# Patient Record
Sex: Female | Born: 1945 | Race: White | Hispanic: No | Marital: Married | Smoking: Never smoker
Health system: Southern US, Community
[De-identification: ages and names within clinical notes are randomized; demographics above are authoritative.]

## PROBLEM LIST (undated history)

## (undated) DIAGNOSIS — S82209A Unspecified fracture of shaft of unspecified tibia, initial encounter for closed fracture: Secondary | ICD-10-CM

## (undated) DIAGNOSIS — R079 Chest pain, unspecified: Secondary | ICD-10-CM

## (undated) DIAGNOSIS — T4145XA Adverse effect of unspecified anesthetic, initial encounter: Secondary | ICD-10-CM

## (undated) DIAGNOSIS — E559 Vitamin D deficiency, unspecified: Secondary | ICD-10-CM

## (undated) DIAGNOSIS — E7800 Pure hypercholesterolemia, unspecified: Secondary | ICD-10-CM

## (undated) DIAGNOSIS — T8859XA Other complications of anesthesia, initial encounter: Secondary | ICD-10-CM

## (undated) DIAGNOSIS — M1990 Unspecified osteoarthritis, unspecified site: Secondary | ICD-10-CM

## (undated) DIAGNOSIS — J302 Other seasonal allergic rhinitis: Secondary | ICD-10-CM

## (undated) DIAGNOSIS — E039 Hypothyroidism, unspecified: Secondary | ICD-10-CM

## (undated) DIAGNOSIS — S93431A Sprain of tibiofibular ligament of right ankle, initial encounter: Secondary | ICD-10-CM

## (undated) DIAGNOSIS — K219 Gastro-esophageal reflux disease without esophagitis: Secondary | ICD-10-CM

## (undated) DIAGNOSIS — Z9889 Other specified postprocedural states: Secondary | ICD-10-CM

## (undated) DIAGNOSIS — I10 Essential (primary) hypertension: Secondary | ICD-10-CM

## (undated) DIAGNOSIS — R112 Nausea with vomiting, unspecified: Secondary | ICD-10-CM

## (undated) DIAGNOSIS — S82409A Unspecified fracture of shaft of unspecified fibula, initial encounter for closed fracture: Secondary | ICD-10-CM

## (undated) DIAGNOSIS — E063 Autoimmune thyroiditis: Secondary | ICD-10-CM

## (undated) HISTORY — DX: Sprain of tibiofibular ligament of right ankle, initial encounter: S93.431A

## (undated) HISTORY — PX: MYOMECTOMY: SHX85

## (undated) HISTORY — DX: Chest pain, unspecified: R07.9

## (undated) HISTORY — DX: Pure hypercholesterolemia, unspecified: E78.00

## (undated) HISTORY — PX: BUNIONECTOMY: SHX129

## (undated) HISTORY — PX: COLONOSCOPY: SHX174

## (undated) HISTORY — DX: Unspecified fracture of shaft of unspecified tibia, initial encounter for closed fracture: S82.209A

## (undated) HISTORY — DX: Essential (primary) hypertension: I10

## (undated) HISTORY — DX: Unspecified fracture of shaft of unspecified fibula, initial encounter for closed fracture: S82.409A

## (undated) HISTORY — DX: Autoimmune thyroiditis: E06.3

## (undated) HISTORY — DX: Other seasonal allergic rhinitis: J30.2

## (undated) HISTORY — DX: Hypothyroidism, unspecified: E03.9

---

## 1997-12-09 ENCOUNTER — Other Ambulatory Visit: Admission: RE | Admit: 1997-12-09 | Discharge: 1997-12-09 | Payer: Self-pay | Admitting: Obstetrics and Gynecology

## 1998-12-11 ENCOUNTER — Other Ambulatory Visit: Admission: RE | Admit: 1998-12-11 | Discharge: 1998-12-11 | Payer: Self-pay | Admitting: Obstetrics and Gynecology

## 1998-12-25 ENCOUNTER — Encounter (INDEPENDENT_AMBULATORY_CARE_PROVIDER_SITE_OTHER): Payer: Self-pay | Admitting: Specialist

## 1998-12-25 ENCOUNTER — Other Ambulatory Visit: Admission: RE | Admit: 1998-12-25 | Discharge: 1998-12-25 | Payer: Self-pay | Admitting: Obstetrics and Gynecology

## 2001-01-01 ENCOUNTER — Other Ambulatory Visit: Admission: RE | Admit: 2001-01-01 | Discharge: 2001-01-01 | Payer: Self-pay | Admitting: Obstetrics and Gynecology

## 2001-02-22 ENCOUNTER — Ambulatory Visit (HOSPITAL_COMMUNITY): Admission: RE | Admit: 2001-02-22 | Discharge: 2001-02-22 | Payer: Self-pay | Admitting: Obstetrics and Gynecology

## 2002-02-04 ENCOUNTER — Other Ambulatory Visit: Admission: RE | Admit: 2002-02-04 | Discharge: 2002-02-04 | Payer: Self-pay | Admitting: Obstetrics and Gynecology

## 2003-02-11 ENCOUNTER — Other Ambulatory Visit: Admission: RE | Admit: 2003-02-11 | Discharge: 2003-02-11 | Payer: Self-pay | Admitting: Obstetrics and Gynecology

## 2003-05-14 ENCOUNTER — Ambulatory Visit (HOSPITAL_COMMUNITY): Admission: RE | Admit: 2003-05-14 | Discharge: 2003-05-14 | Payer: Self-pay | Admitting: Gastroenterology

## 2004-03-01 ENCOUNTER — Other Ambulatory Visit: Admission: RE | Admit: 2004-03-01 | Discharge: 2004-03-01 | Payer: Self-pay | Admitting: Obstetrics and Gynecology

## 2005-03-07 ENCOUNTER — Other Ambulatory Visit: Admission: RE | Admit: 2005-03-07 | Discharge: 2005-03-07 | Payer: Self-pay | Admitting: Obstetrics and Gynecology

## 2006-05-22 ENCOUNTER — Encounter: Admission: RE | Admit: 2006-05-22 | Discharge: 2006-05-22 | Payer: Self-pay | Admitting: Family Medicine

## 2007-03-05 ENCOUNTER — Encounter: Admission: RE | Admit: 2007-03-05 | Discharge: 2007-03-05 | Payer: Self-pay | Admitting: Family Medicine

## 2010-07-02 NOTE — H&P (Signed)
[facility]  PatientMARGIE, [name] Visit Number: [id number] MRN: [id number]          Service Type: Attending:  Juluis Mire, M.D. Dictated by:   Juluis Mire, M.D. Adm. Date:  02/22/01                           History and Physical  CHIEF COMPLAINT:              The patient is a 65 year old nulligravida married white female, who presents for diagnostic laparoscopy with laser standby for evaluation of pelvic mass.  HISTORY OF PRESENT ILLNESS:   In relation to the present admission, the patient was seen on January 01, 2001 for routine annual examination.  It was felt she had an adnexal fullness on the right side.  We felt that it could be a pedunculated fibroid as she had a history of that.  Subsequently she had an ultrasound performed in the office which revealed multiple fibroids.  There was a 5 cm right adnexal mass that could not be differentiated from the ovary from a fibroid.  Therefore, she presents for laparoscopic evaluation to determine.  ALLERGIES:                    No known drug allergies.  MEDICATIONS:                  She has been on hormone replacement therapy, which she has subsequently weaned herself off of.  Denies any abnormal bleeding.  PAST MEDICAL HISTORY:         Usual childhood diseases.  No significant sequelae.  PAST SURGICAL HISTORY:        1. Bunionectomy.                               2. Laser ablation of condyloma.                               3. Dermabrasion of the face.  FAMILY HISTORY:               Strong family history of myocardial infarction and atherosclerotic cardiovascular disease.  Also a history of type 2 diabetes.  SOCIAL HISTORY:               No tobacco or alcohol use.  REVIEW OF SYSTEMS:            Noncontributory.  PHYSICAL EXAMINATION:  VITAL SIGNS:                  The patient is afebrile, with stable vital signs.  HEENT:                        Normocephalic.  PERRLA.  EOMI.   Sclerae and conjunctivae clear.  Oropharynx clear.  NECK:                         Without thyromegaly.  BREAST:                       No discrete masses.  LUNGS:                        Clear.  CARDIOVASCULAR:               Regular rhythm and rate without murmurs or gallops.  ABDOMEN:                      Benign.  No masses, organomegaly, or tenderness.  PELVIC:                       Normal external genitalia.  Vaginal mucosa clear.  Cervix unremarkable.  Uterus is enlarged, consistent with known uterine fibroids.  There is a continued right-sided fullness.  Left adnexa unremarkable.  EXTREMITIES:                  Trace edema.  NEUROLOGIC:                   Examination grossly within normal limits.  IMPRESSION:                   Uterine fibroids, possibly a pedunculated fibroid on the right side; rule out ovarian source.  PLAN:                         The patient is to undergo laparoscopic evaluation for management.  The risks of surgery have been discussed including the risks of anesthesia, the risk of infection, the risk of vascular injury that could lead to hemorrhage requiring transfusion with risk for AIDS or hepatitis, the risk of injury to adjacent organs including bladder or bowel that could require further exploratory surgery, risk of deep vein thrombosis and pulmonary embolus.  The patient expressed understanding of the indications and risks. Dictated by:   Juluis Mire, M.D. Attending:  Juluis Mire, M.D. DD:  02/22/01 TD:  02/22/01 Job: [id number]

## 2010-07-02 NOTE — Op Note (Signed)
NAME:  [name], [name]                            ACCOUNT NO.:  [id number]   MEDICAL RECORD NO.:  [id number]                   PATIENT TYPE:  AMB   LOCATION:  ENDO                                 FACILITY:  [facility]   PHYSICIAN:  John C. Madilyn Fireman, M.D.                 DATE OF BIRTH:  05-30-1945   DATE OF PROCEDURE:  05/14/2003  DATE OF DISCHARGE:                                 OPERATIVE REPORT   PROCEDURE:  Colonoscopy.   INDICATION FOR PROCEDURE:  Colon cancer screening in [name] 65 year old patient  with no recent screening.   DESCRIPTION OF PROCEDURE:  The patient was placed in the left lateral  decubitus position and placed on the pulse monitor with continuous low-flow  oxygen delivered by nasal cannula.  She was sedated with 82.5 mcg IV  fentanyl and 9 mg IV Versed.  The Olympus video colonoscope was inserted  into the rectum and advanced to the cecum, confirmed by transillumination at  McBurney's point and visualization of the ileocecal valve and appendiceal  orifice.  The prep was excellent.  The cecum, ascending, transverse,  descending, and sigmoid colon all appeared normal with no masses, polyps,  diverticula, or other mucosal abnormalities.  The rectum likewise appeared  normal, and retroflexed view of the anus revealed no obvious internal  hemorrhoids.  The scope was then withdrawn and the patient returned to the  recovery room in stable condition.  She tolerated the procedure well, and  there were no immediate complications.   IMPRESSION:  Normal colonoscopy.   PLAN:  Next screening by sigmoidoscopy in 5 years.                                               John C. Madilyn Fireman, M.D.    JCH/MEDQ  D:  05/14/2003  T:  05/14/2003  Job:  [id number]   cc:   Juluis Mire, M.D.  [address] [place]  [place] [address]  Fax: [phone]

## 2010-07-02 NOTE — Op Note (Signed)
[facility]  PatientAYLEEN, [name] Visit Number: [id number] MRN: [id number]          Service Type: DSU Location: [facility] Attending Physician:  Frederich Balding Dictated by:   Juluis Mire, M.D. Proc. Date: 02/22/01 Admit Date:  02/22/2001                             Operative Report  PREOPERATIVE DIAGNOSIS:       Uterine fibroid.  POSTOPERATIVE DIAGNOSIS:      Uterine fibroid.  OPERATION:                    Laparoscopy.  SURGEON:                      Juluis Mire, M.D.  ANESTHESIA:                   General endotracheal.  ESTIMATED BLOOD LOSS:         Minimal.  PACKS AND DRAINS:             None.  INTRAOPERATIVE BLOOD REPLACEMENT:                  None.  COMPLICATIONS:                None.  INDICATIONS:                  Are dictated in the History and Physical.  DESCRIPTION OF PROCEDURE:     The patient was taken to the OR and placed in the supine position.  After a satisfactory level of general endotracheal anesthesia was obtained, the patient was placed in the dorsolithotomy position using Allen stirrups.  The abdomen, perineum, and vagina were prepped out with Betadine.  A Hulka tenaculum was put in place.  The bladder was emptied by in and out catheterization.  The patient was draped out for laparoscopy.  A subumbilical incision was made with the knife.  The Veress needle was introduced in the abdominal cavity.  The abdomen was inflated with approximately 2 L of carbon dioxide.  The operating laparoscope was introduced.  Visualization revealed no evidence of injury to adjacent organs. A 5 mm trocar was put in place in the suprapubic area.  The appendix was seen and noted to be normal.  The upper abdomen including the liver and tip of the gallbladder and both lateral gutters were clear.  The uterus had a fibroid on the fundal area, measuring approximately 2 cm, and there was a pedunculated fibroid coming off the right side,  and measured approximately 6 cm.  Both ovaries were small and atrophic.  There was no other active process.  It was felt that the fibroid was too large to remove laparoscopically, and required minilaparotomy, and therefore was not _________ at this point in time. Everything looked benign.  At this point in time, the abdomen was deflated with carbon dioxide.  All trocars were removed.  The subumbilical incision was closed with interrupted subcuticular 4-0 Vicryl.  The suprapubic incision was closed with Steri-Strips.  The Hulka tenaculum was removed.  The patient was taken out of the dorsolithotomy position once alert and extubated, and transferred to the recovery room in good condition.  The sponge, instrument, and needle count was reported as correct by the circulating nurse. Dictated by:   Juluis Mire, M.D.  Attending Physician:  Frederich Balding DD:  02/22/01 TD:  02/22/01 Job: [id number]

## 2011-08-16 ENCOUNTER — Other Ambulatory Visit: Payer: Self-pay | Admitting: Family Medicine

## 2011-08-16 DIAGNOSIS — Z1231 Encounter for screening mammogram for malignant neoplasm of breast: Secondary | ICD-10-CM

## 2011-08-17 ENCOUNTER — Other Ambulatory Visit: Payer: Self-pay | Admitting: Family Medicine

## 2011-08-17 DIAGNOSIS — R1013 Epigastric pain: Secondary | ICD-10-CM

## 2011-08-24 ENCOUNTER — Ambulatory Visit
Admission: RE | Admit: 2011-08-24 | Discharge: 2011-08-24 | Disposition: A | Discharge status: Home / Self Care | Payer: BC Managed Care – PPO | Source: Ambulatory Visit | Attending: Family Medicine | Admitting: Family Medicine

## 2011-08-24 DIAGNOSIS — R1013 Epigastric pain: Secondary | ICD-10-CM

## 2011-09-02 ENCOUNTER — Ambulatory Visit
Admission: RE | Admit: 2011-09-02 | Discharge: 2011-09-02 | Disposition: A | Discharge status: Home / Self Care | Payer: BC Managed Care – PPO | Source: Ambulatory Visit | Attending: Family Medicine | Admitting: Family Medicine

## 2011-09-02 DIAGNOSIS — Z1231 Encounter for screening mammogram for malignant neoplasm of breast: Secondary | ICD-10-CM

## 2012-08-29 ENCOUNTER — Other Ambulatory Visit: Payer: Self-pay

## 2012-08-29 DIAGNOSIS — Z1231 Encounter for screening mammogram for malignant neoplasm of breast: Secondary | ICD-10-CM

## 2012-09-20 ENCOUNTER — Ambulatory Visit
Admission: RE | Admit: 2012-09-20 | Discharge: 2012-09-20 | Disposition: A | Discharge status: Home / Self Care | Payer: BC Managed Care – PPO | Source: Ambulatory Visit

## 2012-09-20 DIAGNOSIS — Z1231 Encounter for screening mammogram for malignant neoplasm of breast: Secondary | ICD-10-CM

## 2013-01-13 ENCOUNTER — Encounter: Payer: Self-pay | Admitting: Cardiology

## 2013-01-14 ENCOUNTER — Encounter: Payer: Self-pay | Admitting: Interventional Cardiology

## 2013-01-14 ENCOUNTER — Encounter (INDEPENDENT_AMBULATORY_CARE_PROVIDER_SITE_OTHER): Payer: Self-pay

## 2013-01-14 ENCOUNTER — Ambulatory Visit (INDEPENDENT_AMBULATORY_CARE_PROVIDER_SITE_OTHER): Payer: Medicare Other | Admitting: Interventional Cardiology

## 2013-01-14 VITALS — BP 140/82 | HR 54 | Ht 62.0 in | Wt 145.0 lb

## 2013-01-14 DIAGNOSIS — I10 Essential (primary) hypertension: Secondary | ICD-10-CM

## 2013-01-14 DIAGNOSIS — R079 Chest pain, unspecified: Secondary | ICD-10-CM

## 2013-01-14 DIAGNOSIS — E785 Hyperlipidemia, unspecified: Secondary | ICD-10-CM | POA: Insufficient documentation

## 2013-01-14 HISTORY — DX: Chest pain, unspecified: R07.9

## 2013-01-14 NOTE — Progress Notes (Signed)
Patient ID: [name], female   DOB: Aug 18, 1945, 67 y.o.   MRN: [id number]   Date: 01/14/2013 ID: [name], DOB Mar 07, 1945, MRN [id number] PCP: Shirlean Mylar, D, MD  Reason: Chest pain  ASSESSMENT;  1. Chest pain 2. Hyperlipidemia 3. Hypertension  PLAN:  1. ETT 2. Low salt, active lifestyle, low-fat   SUBJECTIVE: [name] is a 67 y.o. female who is known as a burning/cold sensation when she would breathe deeply after working in her yard in the late summer. This sensation lasted nearly 6 weeks. She mentioned it to her primary physician and was sent for cardiac evaluation. She is active and walks 40 minutes on a near daily basis. During the time frame that the chest sensation was present, there was no limitation of physical activity or worsening in the discomfort. She denied dyspnea. The sensation is completely resolved. She started Prilosec and perhaps this brought about some improvement. She denies orthopnea, PND, edema, palpitations, exertional chest pain, and transient neurological symptoms.   No Known Allergies  Current Outpatient Prescriptions on File Prior to Visit  Medication Sig Dispense Refill  . aspirin 81 MG tablet Take 81 mg by mouth daily.      . diphenhydrAMINE (BENADRYL) 25 MG tablet Take 25 mg by mouth as needed.      . fexofenadine (ALLEGRA) 180 MG tablet Take 180 mg by mouth daily as needed for allergies or rhinitis.      . fluticasone (FLONASE) 50 MCG/ACT nasal spray Place 2 sprays into both nostrils as needed for allergies or rhinitis.      . hydrochlorothiazide (HYDRODIURIL) 25 MG tablet Take 25 mg by mouth daily.      . Multiple Vitamin (MULTIVITAMIN) tablet Take 1 tablet by mouth daily.      . simvastatin (ZOCOR) 40 MG tablet Take 40 mg by mouth daily.       No current facility-administered medications on file prior to visit.    Past Medical History  Diagnosis Date  . Hypertension   . High cholesterol     Past Surgical History  Procedure  Laterality Date  . Laparascopy      , for Fibroids    History   Social History  . Marital Status: Married    Spouse Name: N/A    Number of Children: N/A  . Years of Education: N/A   Occupational History  . Not on file.   Social History Main Topics  . Smoking status: Never Smoker   . Smokeless tobacco: Never Used  . Alcohol Use: 0.0 oz/week    3-4 Glasses of wine per week  . Drug Use: No  . Sexual Activity: Not on file   Other Topics Concern  . Not on file   Social History Narrative  . No narrative on file    No family history on file.  ROS: No history of stroke, claudication, racing heart, irregular heartbeat, edema, weight gain or weight loss, change in appetite, or other complaints.. Other systems negative for complaints.  OBJECTIVE: BP 140/82  Pulse 54  Ht 5\' 2"  (1.575 m)  Wt 145 lb (65.772 kg)  BMI 26.51 kg/m2,  General: No acute distress, younger than stated age HEENT: normal   Neck: JVD flat. Carotids absent  Chest: Clear Cardiac: Murmur: 1 of 6 systolic murmur right upper sternal border. Gallop: Absent. Rhythm: Normal. Other: Normal Abdomen: Bruit: Absent. Pulsation: Absent Extremities: Edema: Absent. Pulses: 1-2+ Neuro: Normal Psych: Normal  ECG: normal

## 2013-01-14 NOTE — Patient Instructions (Addendum)
Your physician recommends that you continue on your current medications as directed. Please refer to the Current Medication list given to you today.  Your physician has requested that you have an exercise tolerance test. For further information please visit [email or web address] Please also follow instruction sheet, as given.  Follow up pending exercise tolerance test

## 2013-02-26 ENCOUNTER — Encounter: Payer: Medicare Other | Admitting: Physician Assistant

## 2013-02-28 ENCOUNTER — Ambulatory Visit (INDEPENDENT_AMBULATORY_CARE_PROVIDER_SITE_OTHER): Payer: Medicare Other | Admitting: Physician Assistant

## 2013-02-28 DIAGNOSIS — R079 Chest pain, unspecified: Secondary | ICD-10-CM

## 2013-02-28 NOTE — Progress Notes (Signed)
Stress test demonstrates no problem with blocked arteries. No further testing needed.

## 2013-02-28 NOTE — Progress Notes (Signed)
Exercise Treadmill Test  Pre-Exercise Testing Evaluation Rhythm: normal sinus  Rate: 65 bpm     Test  Exercise Tolerance Test Ordering MD: Verdis PrimeHenry Smith, MD  Interpreting MD: Tereso Newcomerscott Emily Massar, PA-C  Unique Test No: 1  Treadmill:  1  Indication for ETT: chest pain - rule out ischemia  Contraindication to ETT: No   Stress Modality: exercise - treadmill  Cardiac Imaging Performed: non   Protocol: standard Bruce - maximal  Max BP:  204/102  Max MPHR (bpm):  155 85% MPR (bpm):  132  MPHR obtained (bpm):  153 % MPHR obtained:  98  Reached 85% MPHR (min:sec):  4:35 Total Exercise Time (min-sec):  9:00  Workload in METS:  10.1 Borg Scale: 16  Reason ETT Terminated:  desired heart rate attained    ST Segment Analysis At Rest: normal ST segments - no evidence of significant ST depression With Exercise: no evidence of significant ST depression  Other Information Arrhythmia:  No Angina during ETT:  absent (0) Quality of ETT:  diagnostic  ETT Interpretation:  normal - no evidence of ischemia by ST analysis  Comments: Good exercise capacity. No chest pain. Normal BP response to exercise. No ST changes to suggest ischemia.   Recommendations: F/u with Dr. Verdis PrimeHenry [name] as directed. Signed,  Tereso NewcomerScott Christeena Krogh, PA-C   02/28/2013 4:24 PM

## 2013-03-05 NOTE — Progress Notes (Signed)
Stress test is normal. No further testing

## 2013-09-17 ENCOUNTER — Other Ambulatory Visit: Payer: Self-pay

## 2013-09-17 DIAGNOSIS — Z1231 Encounter for screening mammogram for malignant neoplasm of breast: Secondary | ICD-10-CM

## 2013-09-23 ENCOUNTER — Ambulatory Visit
Admission: RE | Admit: 2013-09-23 | Discharge: 2013-09-23 | Disposition: A | Discharge status: Home / Self Care | Payer: Medicare Other | Source: Ambulatory Visit

## 2013-09-23 DIAGNOSIS — Z1231 Encounter for screening mammogram for malignant neoplasm of breast: Secondary | ICD-10-CM

## 2014-08-21 ENCOUNTER — Other Ambulatory Visit: Payer: Self-pay

## 2014-08-21 DIAGNOSIS — Z1231 Encounter for screening mammogram for malignant neoplasm of breast: Secondary | ICD-10-CM

## 2014-08-26 ENCOUNTER — Other Ambulatory Visit: Payer: Self-pay | Admitting: Family Medicine

## 2014-08-26 ENCOUNTER — Other Ambulatory Visit: Payer: Self-pay

## 2014-08-26 ENCOUNTER — Ambulatory Visit
Admission: RE | Admit: 2014-08-26 | Discharge: 2014-08-26 | Disposition: A | Discharge status: Home / Self Care | Payer: Medicare Other | Source: Ambulatory Visit | Attending: Family Medicine | Admitting: Family Medicine

## 2014-08-26 DIAGNOSIS — M79604 Pain in right leg: Secondary | ICD-10-CM

## 2014-08-28 ENCOUNTER — Other Ambulatory Visit: Payer: Self-pay

## 2014-09-26 ENCOUNTER — Ambulatory Visit: Payer: Self-pay

## 2014-10-10 ENCOUNTER — Other Ambulatory Visit: Payer: Self-pay | Admitting: Family Medicine

## 2014-10-10 DIAGNOSIS — R102 Pelvic and perineal pain: Secondary | ICD-10-CM

## 2014-10-16 ENCOUNTER — Ambulatory Visit
Admission: RE | Admit: 2014-10-16 | Discharge: 2014-10-16 | Disposition: A | Discharge status: Home / Self Care | Payer: Medicare Other | Source: Ambulatory Visit

## 2014-10-16 DIAGNOSIS — Z1231 Encounter for screening mammogram for malignant neoplasm of breast: Secondary | ICD-10-CM

## 2015-01-27 ENCOUNTER — Emergency Department (HOSPITAL_COMMUNITY): Payer: Medicare Other

## 2015-01-27 ENCOUNTER — Encounter (HOSPITAL_COMMUNITY): Payer: Self-pay | Admitting: *Deleted

## 2015-01-27 ENCOUNTER — Inpatient Hospital Stay (HOSPITAL_COMMUNITY): Payer: Medicare Other

## 2015-01-27 ENCOUNTER — Inpatient Hospital Stay (HOSPITAL_COMMUNITY)
Admission: EM | Admit: 2015-01-27 | Discharge: 2015-01-30 | DRG: 494 | Disposition: A | Discharge status: Home Health | Payer: Medicare Other | Attending: Orthopedic Surgery | Admitting: Orthopedic Surgery

## 2015-01-27 DIAGNOSIS — S82409A Unspecified fracture of shaft of unspecified fibula, initial encounter for closed fracture: Secondary | ICD-10-CM | POA: Diagnosis present

## 2015-01-27 DIAGNOSIS — Z79899 Other long term (current) drug therapy: Secondary | ICD-10-CM

## 2015-01-27 DIAGNOSIS — M79644 Pain in right finger(s): Secondary | ICD-10-CM

## 2015-01-27 DIAGNOSIS — W1830XA Fall on same level, unspecified, initial encounter: Secondary | ICD-10-CM | POA: Diagnosis present

## 2015-01-27 DIAGNOSIS — E785 Hyperlipidemia, unspecified: Secondary | ICD-10-CM | POA: Diagnosis present

## 2015-01-27 DIAGNOSIS — Z7982 Long term (current) use of aspirin: Secondary | ICD-10-CM

## 2015-01-27 DIAGNOSIS — R52 Pain, unspecified: Secondary | ICD-10-CM

## 2015-01-27 DIAGNOSIS — S93431A Sprain of tibiofibular ligament of right ankle, initial encounter: Secondary | ICD-10-CM | POA: Diagnosis present

## 2015-01-27 DIAGNOSIS — E559 Vitamin D deficiency, unspecified: Secondary | ICD-10-CM | POA: Diagnosis present

## 2015-01-27 DIAGNOSIS — S82401A Unspecified fracture of shaft of right fibula, initial encounter for closed fracture: Secondary | ICD-10-CM | POA: Diagnosis present

## 2015-01-27 DIAGNOSIS — S82201A Unspecified fracture of shaft of right tibia, initial encounter for closed fracture: Secondary | ICD-10-CM

## 2015-01-27 DIAGNOSIS — E7800 Pure hypercholesterolemia, unspecified: Secondary | ICD-10-CM | POA: Diagnosis present

## 2015-01-27 DIAGNOSIS — S82871A Displaced pilon fracture of right tibia, initial encounter for closed fracture: Secondary | ICD-10-CM | POA: Diagnosis present

## 2015-01-27 DIAGNOSIS — E782 Mixed hyperlipidemia: Secondary | ICD-10-CM | POA: Diagnosis present

## 2015-01-27 DIAGNOSIS — S82209A Unspecified fracture of shaft of unspecified tibia, initial encounter for closed fracture: Secondary | ICD-10-CM

## 2015-01-27 DIAGNOSIS — I10 Essential (primary) hypertension: Secondary | ICD-10-CM | POA: Diagnosis present

## 2015-01-27 DIAGNOSIS — Z419 Encounter for procedure for purposes other than remedying health state, unspecified: Secondary | ICD-10-CM

## 2015-01-27 DIAGNOSIS — S93401A Sprain of unspecified ligament of right ankle, initial encounter: Secondary | ICD-10-CM | POA: Diagnosis present

## 2015-01-27 DIAGNOSIS — S82301A Unspecified fracture of lower end of right tibia, initial encounter for closed fracture: Secondary | ICD-10-CM

## 2015-01-27 DIAGNOSIS — M24049 Loose body in unspecified finger joint(s): Secondary | ICD-10-CM

## 2015-01-27 HISTORY — DX: Unspecified fracture of shaft of unspecified fibula, initial encounter for closed fracture: S82.409A

## 2015-01-27 HISTORY — DX: Unspecified fracture of shaft of unspecified fibula, initial encounter for closed fracture: S82.209A

## 2015-01-27 HISTORY — DX: Vitamin D deficiency, unspecified: E55.9

## 2015-01-27 LAB — CBC WITH DIFFERENTIAL/PLATELET
BASOS ABS: 0 10*3/uL (ref 0.0–0.1)
BASOS PCT: 0 %
Eosinophils Absolute: 0 10*3/uL (ref 0.0–0.7)
Eosinophils Relative: 0 %
HEMATOCRIT: 47.9 % — AB (ref 36.0–46.0)
Hemoglobin: 17.1 g/dL — ABNORMAL HIGH (ref 12.0–15.0)
LYMPHS PCT: 8 %
Lymphs Abs: 1.1 10*3/uL (ref 0.7–4.0)
MCH: 32.7 pg (ref 26.0–34.0)
MCHC: 35.7 g/dL (ref 30.0–36.0)
MCV: 91.6 fL (ref 78.0–100.0)
MONO ABS: 0.8 10*3/uL (ref 0.1–1.0)
Monocytes Relative: 5 %
NEUTROS ABS: 12.1 10*3/uL — AB (ref 1.7–7.7)
NEUTROS PCT: 87 %
Platelets: 222 10*3/uL (ref 150–400)
RBC: 5.23 MIL/uL — AB (ref 3.87–5.11)
RDW: 12.4 % (ref 11.5–15.5)
WBC: 14 10*3/uL — AB (ref 4.0–10.5)

## 2015-01-27 LAB — BASIC METABOLIC PANEL
ANION GAP: 14 (ref 5–15)
BUN: 13 mg/dL (ref 6–20)
CALCIUM: 10 mg/dL (ref 8.9–10.3)
CO2: 24 mmol/L (ref 22–32)
Chloride: 101 mmol/L (ref 101–111)
Creatinine, Ser: 0.86 mg/dL (ref 0.44–1.00)
Glucose, Bld: 135 mg/dL — ABNORMAL HIGH (ref 65–99)
POTASSIUM: 3.9 mmol/L (ref 3.5–5.1)
Sodium: 139 mmol/L (ref 135–145)

## 2015-01-27 MED ORDER — ONDANSETRON HCL 4 MG PO TABS: 4.0000 mg | ORAL_TABLET | Freq: Four times a day (QID) | ORAL | Status: DC | PRN

## 2015-01-27 MED ORDER — ONDANSETRON HCL 4 MG/2ML IJ SOLN
4.0000 mg | Freq: Four times a day (QID) | INTRAMUSCULAR | Status: DC | PRN
  Administered 2015-01-29: 4 mg via INTRAVENOUS

## 2015-01-27 MED ORDER — ZOLPIDEM TARTRATE 5 MG PO TABS: 5.0000 mg | ORAL_TABLET | Freq: Every evening | ORAL | Status: DC | PRN

## 2015-01-27 MED ORDER — KCL IN DEXTROSE-NACL 20-5-0.45 MEQ/L-%-% IV SOLN
INTRAVENOUS | Status: DC
  Administered 2015-01-27: 10 mL/h via INTRAVENOUS
  Filled 2015-01-27: qty 1000

## 2015-01-27 MED ORDER — OXYCODONE HCL 5 MG PO TABS
5.0000 mg | ORAL_TABLET | ORAL | Status: DC | PRN
  Administered 2015-01-28: 5 mg via ORAL
  Filled 2015-01-27: qty 1

## 2015-01-27 MED ORDER — SIMVASTATIN 40 MG PO TABS
40.0000 mg | ORAL_TABLET | Freq: Every day | ORAL | Status: DC
  Administered 2015-01-27 – 2015-01-28 (×2): 40 mg via ORAL
  Filled 2015-01-27 (×3): qty 1

## 2015-01-27 MED ORDER — FLEET ENEMA 7-19 GM/118ML RE ENEM: 1.0000 | ENEMA | Freq: Once | RECTAL | Status: DC | PRN

## 2015-01-27 MED ORDER — POLYETHYLENE GLYCOL 3350 17 G PO PACK: 17.0000 g | PACK | Freq: Every day | ORAL | Status: DC | PRN

## 2015-01-27 MED ORDER — HYDROCHLOROTHIAZIDE 25 MG PO TABS
25.0000 mg | ORAL_TABLET | Freq: Every day | ORAL | Status: DC
  Administered 2015-01-28: 25 mg via ORAL
  Filled 2015-01-27: qty 1

## 2015-01-27 MED ORDER — FLUTICASONE PROPIONATE 50 MCG/ACT NA SUSP: 2.0000 | NASAL | Status: DC | PRN

## 2015-01-27 MED ORDER — BISACODYL 10 MG RE SUPP: 10.0000 mg | Freq: Every day | RECTAL | Status: DC | PRN

## 2015-01-27 MED ORDER — HYDROMORPHONE HCL 1 MG/ML IJ SOLN
1.0000 mg | Freq: Once | INTRAMUSCULAR | Status: AC
  Administered 2015-01-27: 1 mg via INTRAVENOUS
  Filled 2015-01-27: qty 1

## 2015-01-27 MED ORDER — LORATADINE 10 MG PO TABS
10.0000 mg | ORAL_TABLET | Freq: Every day | ORAL | Status: DC
  Filled 2015-01-27: qty 1

## 2015-01-27 MED ORDER — DOCUSATE SODIUM 100 MG PO CAPS
100.0000 mg | ORAL_CAPSULE | Freq: Two times a day (BID) | ORAL | Status: DC
Start: 2015-01-27 — End: 2015-01-30
  Administered 2015-01-27 – 2015-01-30 (×6): 100 mg via ORAL
  Filled 2015-01-27 (×5): qty 1

## 2015-01-27 MED ORDER — MORPHINE SULFATE (PF) 2 MG/ML IV SOLN
1.0000 mg | INTRAVENOUS | Status: DC | PRN
  Administered 2015-01-27 – 2015-01-29 (×4): 2 mg via INTRAVENOUS
  Filled 2015-01-27 (×5): qty 1

## 2015-01-27 MED ORDER — DIPHENHYDRAMINE HCL 25 MG PO TABS: 25.0000 mg | ORAL_TABLET | ORAL | Status: DC | PRN

## 2015-01-27 NOTE — ED Notes (Signed)
Attempted to call report.  Informed are in report, try back in about 5 minutes.  Lordis will be the nurse.

## 2015-01-27 NOTE — ED Provider Notes (Signed)
7:45 PM patient is resting comfortably. [facility]  Doug SouSam Vlad Mayberry, MD 01/27/15 [phone]

## 2015-01-27 NOTE — Progress Notes (Signed)
CSW met with patient at bedside. There was no family present. Patient confirms that she presents to [facility] due to falling. Patient stated " I had on a backpack blower. I was blowing leaves and slipped." According to patient, she does not fall often and has been able to complete her ADL's independently prior to this incident.  Patient informed CSW that she lives at home in [place] with her husband. Patient states that she has a great support system that consist of her husband, sister, and children.  Patient states that if rehab is needed she would prefer home health. Patient became slightly tearful at bedside and appeared to be overwhelmed. CSW offered encouragement.  Patient states that she does not have any questions for CSW.  Husband/ Rob [phone] Sister/ Elle [phone]  Willette Brace [phone] ED CSW 01/27/2015 1:10 PM

## 2015-01-27 NOTE — ED Notes (Signed)
Patient transported to CT 

## 2015-01-27 NOTE — ED Notes (Signed)
Report given to Tiffany, RN for completion of transfer.

## 2015-01-27 NOTE — ED Notes (Signed)
Ortho MD @BS

## 2015-01-27 NOTE — ED Provider Notes (Signed)
CSN: [id number]     Arrival date & time 01/27/15  1240 History   First MD Initiated Contact with Patient 01/27/15 1249     Chief Complaint  Patient presents with  . Ankle Injury     (Consider location/radiation/quality/duration/timing/severity/associated sxs/prior Treatment) Patient is a 69 y.o. female presenting with ankle pain. The history is provided by the patient.  Ankle Pain Location:  Leg Time since incident:  1 hour Injury: yes   Mechanism of injury: fall   Fall:    Fall occurred:  Standing   Impact surface:  Dirt   Entrapped after fall: no   Leg location:  R lower leg Pain details:    Quality:  Aching   Radiates to:  Does not radiate   Severity:  Moderate   Onset quality:  Sudden   Duration:  1 hour   Timing:  Constant   Progression:  Unchanged Chronicity:  New Relieved by:  Nothing Worsened by:  Bearing weight Ineffective treatments:  None tried Associated symptoms: no fever    69 yo F with a chief complaint of a fall.  Patient was blowing leaves, and slipped on an incline.  Inverted her right ankle.  Pain and deformity afterwards.  Denies prior injury.  Picked up by EMS.  Given of fentanyl enroute.   Past Medical History  Diagnosis Date  . Hypertension   . High cholesterol    Past Surgical History  Procedure Laterality Date  . Laparascopy      , for Fibroids   Family History  Problem Relation Age of Onset  . CVA Mother   . Heart disease Father   . Heart attack Sister    Social History  Substance Use Topics  . Smoking status: Never Smoker   . Smokeless tobacco: Never Used  . Alcohol Use: 0.0 oz/week    3-4 Glasses of wine per week   OB History    No data available     Review of Systems  Constitutional: Negative for fever and chills.  HENT: Negative for congestion and rhinorrhea.   Eyes: Negative for redness and visual disturbance.  Respiratory: Negative for shortness of breath and wheezing.   Cardiovascular: Negative for chest  pain and palpitations.  Gastrointestinal: Negative for nausea and vomiting.  Genitourinary: Negative for dysuria and urgency.  Musculoskeletal: Positive for myalgias and arthralgias.  Skin: Negative for pallor and wound.  Neurological: Negative for dizziness and headaches.      Allergies  Review of patient's allergies indicates no known allergies.  Home Medications   Prior to Admission medications   Medication Sig Start Date End Date Taking? Authorizing Provider  aspirin 81 MG tablet Take 81 mg by mouth daily.   Yes Historical Provider, MD  diphenhydrAMINE (BENADRYL) 25 MG tablet Take 25 mg by mouth as needed.   Yes Historical Provider, MD  fexofenadine (ALLEGRA) 180 MG tablet Take 180 mg by mouth daily as needed for allergies or rhinitis.   Yes Historical Provider, MD  fluticasone (FLONASE) 50 MCG/ACT nasal spray Place 2 sprays into both nostrils as needed for allergies or rhinitis.   Yes Historical Provider, MD  hydrochlorothiazide (HYDRODIURIL) 25 MG tablet Take 25 mg by mouth daily.   Yes Historical Provider, MD  ibuprofen (ADVIL,MOTRIN) 200 MG tablet Take 200 mg by mouth every 6 (six) hours as needed for moderate pain.   Yes Historical Provider, MD  simvastatin (ZOCOR) 40 MG tablet Take 40 mg by mouth daily.   Yes Historical Provider,  MD   BP 146/78 mmHg  Pulse 77  Temp(Src) 98.5 F (36.9 C) (Oral)  Resp 16  Ht 5\' 2"  (1.575 m)  Wt 144 lb (65.318 kg)  BMI 26.33 kg/m2  SpO2 97% Physical Exam  Constitutional: She is oriented to person, place, and time. She appears well-developed and well-nourished. No distress.  HENT:  Head: Normocephalic and atraumatic.  Eyes: EOM are normal. Pupils are equal, round, and reactive to light.  Neck: Normal range of motion. Neck supple.  Cardiovascular: Normal rate and regular rhythm.  Exam reveals no gallop and no friction rub.   No murmur heard. Pulmonary/Chest: Effort normal. She has no wheezes. She has no rales.  Abdominal: Soft. She  exhibits no distension. There is no tenderness. There is no rebound and no guarding.  Musculoskeletal: She exhibits edema and tenderness.  Deformity noted to the right lower extremity.  PMS intact distally.    Neurological: She is alert and oriented to person, place, and time.  Skin: Skin is warm and dry. She is not diaphoretic.  Psychiatric: She has a normal mood and affect. Her behavior is normal.  Nursing note and vitals reviewed.   ED Course  Procedures (including critical care time) Labs Review Labs Reviewed  CBC WITH DIFFERENTIAL/PLATELET - Abnormal; Notable for the following:    WBC 14.0 (*)    RBC 5.23 (*)    Hemoglobin 17.1 (*)    HCT 47.9 (*)    Neutro Abs 12.1 (*)    All other components within normal limits  BASIC METABOLIC PANEL - Abnormal; Notable for the following:    Glucose, Bld 135 (*)    All other components within normal limits    Imaging Review Dg Chest 2 View  01/27/2015  CLINICAL DATA:  Pain following fall.  Hypertension EXAM: CHEST  2 VIEW COMPARISON:  March 05, 2007 FINDINGS: There is no edema or consolidation. The heart size and pulmonary vascularity are normal. No adenopathy. No pneumothorax. There is mild degenerative change in the thoracic spine. IMPRESSION: No edema or consolidation.  No demonstrable pneumothorax. Electronically Signed   By: Bretta BangWilliam  Woodruff III M.D.   On: 01/27/2015 13:58   Dg Tibia/fibula Right  01/27/2015  CLINICAL DATA:  Pain following fall EXAM: RIGHT TIBIA AND FIBULA - 2 VIEW COMPARISON:  None. FINDINGS: Frontal and lateral views were obtained. There is a comminuted fracture of the mid to distal tibia with fracture fragments extending to the level of the tibial plafond. There is mild separation of fracture fragments at the level of the plafond. There is mild posterior displacement of the distal major fracture fragment compared to the major proximal fragment of the tibia. There is also a comminuted fracture at the junction of the  mid and distal thirds of the fibula with several displaced fracture fragments. There is posterior angulation and displacement of the distal major fragment with respect to the major proximal fragment. There are no more proximal fractures. No gross dislocation is seen. No appreciable arthropathy. IMPRESSION: Comminuted, displaced fractures of the distal tibia and distal aspect of the mid third of the fibula extending to the junction of mid and distal thirds. Note that there is a fracture fragment extending to the level of the medial tibial plafond with mild separation of the plafond and distally. No frank dislocations. Electronically Signed   By: Bretta BangWilliam  Woodruff III M.D.   On: 01/27/2015 13:57   Dg Ankle Complete Right  01/27/2015  CLINICAL DATA:  Fall in back ER EXAM:  RIGHT ANKLE - COMPLETE 3+ VIEW COMPARISON:  None. FINDINGS: Three views of the right ankle submitted. There is comminuted displaced fracture distal shaft of right fibula. Comminuted displaced fracture distal tibia. Please note the fracture line is involving the distal articular tibial surface. IMPRESSION: Comminuted fractures of distal tibia and fibula. Intra-articular extension of tibial fracture. Electronically Signed   By: Natasha Mead M.D.   On: 01/27/2015 13:58   Dg Knee Complete 4 Views Right  01/27/2015  CLINICAL DATA:  Right lower leg pain following slip and fall in yard, initial encounter EXAM: RIGHT KNEE - COMPLETE 4+ VIEW COMPARISON:  None. FINDINGS: There is no evidence of fracture, dislocation, or joint effusion. There is no evidence of arthropathy or other focal bone abnormality. Soft tissues are unremarkable. IMPRESSION: No acute abnormality noted. Electronically Signed   By: Alcide Clever M.D.   On: 01/27/2015 13:57   I have personally reviewed and evaluated these images and lab results as part of my medical decision-making.   EKG Interpretation None      MDM   Final diagnoses:  Tibial fracture, right, closed, initial  encounter  Fibula fracture, right, closed, initial encounter    69 yo F with a chief complaint of RLL pain.  Patient with obvious deformity, not open.  PMS intact distally.  Xray with comminuted fx of distal tibia to the joint.  Discussed with Dr. Ave Filter on ortho, will admit, transfer to [facility].   The patients results and plan were reviewed and discussed.   Any x-rays performed were independently reviewed by myself.   Differential diagnosis were considered with the presenting HPI.  Medications  HYDROmorphone (DILAUDID) injection 1 mg (1 mg Intravenous Given 01/27/15 1442)    Filed Vitals:   01/27/15 1252 01/27/15 1430 01/27/15 1441 01/27/15 1445  BP: 149/84 139/88  146/78  Pulse: 65 70  77  Temp: 98.5 F (36.9 C)     TempSrc: Oral     Resp: 18   16  Height:    (1.575 m)   Weight:   144 lb (65.318 kg)   SpO2: 97% 97%  97%    Final diagnoses:  Tibial fracture, right, closed, initial encounter  Fibula fracture, right, closed, initial encounter    Admission/ observation were discussed with the admitting physician, patient and/or family and they are comfortable with the plan.      Melene Plan, DO 01/27/15 1600

## 2015-01-27 NOTE — ED Notes (Signed)
Bed: WA25 Expected date:  Expected time:  Means of arrival:  Comments: No monitor 

## 2015-01-27 NOTE — ED Notes (Signed)
md at bedside

## 2015-01-27 NOTE — ED Notes (Signed)
Per ems pt is from home, was using a backpack leaf blower, pt stepped off the right side of incline, felt right ankle twist and sat down on ground. Obviously swelling and deformity to outer right ankle.   20 G R hand, 150 mcg fentanyl IV given. Pain initially 8/10, now 2/10.

## 2015-01-27 NOTE — H&P (Signed)
[name] is an 69 y.o. female.   Chief Complaint: right ankle fracture HPI: 69 yo female fell this afternoon while raking leaves at home, slipping on an incline. Inverted ankle. Acute ankle pain and deformity, unable to bear weight. EMS called. Husband in with patient and helps with history. Denies previous injury. No other orthopedic complaints or concerns. Pain with any movement, better with rest and elevation.   Past Medical History  Diagnosis Date  . Hypertension   . High cholesterol     Past Surgical History  Procedure Laterality Date  . Laparascopy      , for Fibroids    Family History  Problem Relation Age of Onset  . CVA Mother   . Heart disease Father   . Heart attack Sister    Social History:  reports that she has never smoked. She has never used smokeless tobacco. She reports that she drinks alcohol. She reports that she does not use illicit drugs.  Allergies: No Known Allergies   (Not in a hospital admission)  Results for orders placed or performed during the hospital encounter of 01/27/15 (from the past 48 hour(s))  CBC with Differential     Status: Abnormal   Collection Time: 01/27/15  1:58 PM  Result Value Ref Range   WBC 14.0 (H) 4.0 - 10.5 K/uL   RBC 5.23 (H) 3.87 - 5.11 MIL/uL   Hemoglobin 17.1 (H) 12.0 - 15.0 g/dL   HCT 47.9 (H) 36.0 - 46.0 %   MCV 91.6 78.0 - 100.0 fL   MCH 32.7 26.0 - 34.0 pg   MCHC 35.7 30.0 - 36.0 g/dL   RDW 12.4 11.5 - 15.5 %   Platelets 222 150 - 400 K/uL   Neutrophils Relative % 87 %   Neutro Abs 12.1 (H) 1.7 - 7.7 K/uL   Lymphocytes Relative 8 %   Lymphs Abs 1.1 0.7 - 4.0 K/uL   Monocytes Relative 5 %   Monocytes Absolute 0.8 0.1 - 1.0 K/uL   Eosinophils Relative 0 %   Eosinophils Absolute 0.0 0.0 - 0.7 K/uL   Basophils Relative 0 %   Basophils Absolute 0.0 0.0 - 0.1 K/uL  Basic metabolic panel     Status: Abnormal   Collection Time: 01/27/15  1:58 PM  Result Value Ref Range   Sodium 139 135 - 145 mmol/L    Potassium 3.9 3.5 - 5.1 mmol/L   Chloride 101 101 - 111 mmol/L   CO2 24 22 - 32 mmol/L   Glucose, Bld 135 (H) 65 - 99 mg/dL   BUN 13 6 - 20 mg/dL   Creatinine, Ser 0.86 0.44 - 1.00 mg/dL   Calcium 10.0 8.9 - 10.3 mg/dL   GFR calc non Af Amer >60 >60 mL/min   GFR calc Af Amer >60 >60 mL/min    Comment: (NOTE) The eGFR has been calculated using the CKD EPI equation. This calculation has not been validated in all clinical situations. eGFR's persistently <60 mL/min signify possible Chronic Kidney Disease.    Anion gap 14 5 - 15   Dg Chest 2 View  01/27/2015  CLINICAL DATA:  Pain following fall.  Hypertension EXAM: CHEST  2 VIEW COMPARISON:  March 05, 2007 FINDINGS: There is no edema or consolidation. The heart size and pulmonary vascularity are normal. No adenopathy. No pneumothorax. There is mild degenerative change in the thoracic spine. IMPRESSION: No edema or consolidation.  No demonstrable pneumothorax. Electronically Signed   By: Lowella Grip III  M.D.   On: 01/27/2015 13:58   Dg Tibia/fibula Right  01/27/2015  CLINICAL DATA:  Pain following fall EXAM: RIGHT TIBIA AND FIBULA - 2 VIEW COMPARISON:  None. FINDINGS: Frontal and lateral views were obtained. There is a comminuted fracture of the mid to distal tibia with fracture fragments extending to the level of the tibial plafond. There is mild separation of fracture fragments at the level of the plafond. There is mild posterior displacement of the distal major fracture fragment compared to the major proximal fragment of the tibia. There is also a comminuted fracture at the junction of the mid and distal thirds of the fibula with several displaced fracture fragments. There is posterior angulation and displacement of the distal major fragment with respect to the major proximal fragment. There are no more proximal fractures. No gross dislocation is seen. No appreciable arthropathy. IMPRESSION: Comminuted, displaced fractures of the distal  tibia and distal aspect of the mid third of the fibula extending to the junction of mid and distal thirds. Note that there is a fracture fragment extending to the level of the medial tibial plafond with mild separation of the plafond and distally. No frank dislocations. Electronically Signed   By: Lowella Grip III M.D.   On: 01/27/2015 13:57   Dg Ankle Complete Right  01/27/2015  CLINICAL DATA:  Fall in back ER EXAM: RIGHT ANKLE - COMPLETE 3+ VIEW COMPARISON:  None. FINDINGS: Three views of the right ankle submitted. There is comminuted displaced fracture distal shaft of right fibula. Comminuted displaced fracture distal tibia. Please note the fracture line is involving the distal articular tibial surface. IMPRESSION: Comminuted fractures of distal tibia and fibula. Intra-articular extension of tibial fracture. Electronically Signed   By: Lahoma Crocker M.D.   On: 01/27/2015 13:58   Dg Knee Complete 4 Views Right  01/27/2015  CLINICAL DATA:  Right lower leg pain following slip and fall in yard, initial encounter EXAM: RIGHT KNEE - COMPLETE 4+ VIEW COMPARISON:  None. FINDINGS: There is no evidence of fracture, dislocation, or joint effusion. There is no evidence of arthropathy or other focal bone abnormality. Soft tissues are unremarkable. IMPRESSION: No acute abnormality noted. Electronically Signed   By: Inez Catalina M.D.   On: 01/27/2015 13:57    Review of Systems  Musculoskeletal:       Right ankle pain and deformity, burning  All other systems reviewed and are negative.   Blood pressure 146/78, pulse 77, temperature 98.5 F (36.9 C), temperature source Oral, resp. rate 16, height $RemoveBe'5\' 2"'vlRLSFEbv$  (1.575 m), weight 65.318 kg (144 lb), SpO2 97 %. Physical Exam  Constitutional: She is oriented to person, place, and time. She appears well-developed and well-nourished.  HENT:  Head: Normocephalic and atraumatic.  Eyes: EOM are normal.  Neck: Normal range of motion.  Cardiovascular: Normal rate and  regular rhythm.   Respiratory: Effort normal.  Musculoskeletal:  Right ankle with swelling and obvious deformity Ecchymosis Wiggles toes, distally NVI  Neurological: She is alert and oriented to person, place, and time. She has normal reflexes.  Skin: Skin is warm and dry.  Psychiatric: She has a normal mood and affect. Her behavior is normal. Judgment and thought content normal.     Assessment/Plan Right displaced comminuted tib/fib fracture extending to plafond Will need surgery for anatomic fixation, we recommend this be performed be trauma specialist Dr. Altamese  who agrees to assume care of this patient tomorrow morning Transfer to [place] for surgery Thursday pending  eval by Dr. Marcelino Scot CT right tibia ordered for preoperative planning Okay to eat, d/c NPO status NWB R LE Placed in padded splint in ED by Dr. Tamera Punt Continue to elevate leg on multiple pillows and ice on top of splint  Johnanna Bakke 01/27/2015, 4:28 PM

## 2015-01-27 NOTE — ED Notes (Signed)
md at bedside  Pt alert and oriented x4. Respirations even and unlabored, bilateral symmetrical rise and fall of chest. Skin warm and dry. In no acute distress. Denies needs.   

## 2015-01-27 NOTE — ED Notes (Signed)
UNABLE TO COLLECT LABS AT THIS TIME PATIENT IS GOING TO XRAY 

## 2015-01-28 ENCOUNTER — Inpatient Hospital Stay (HOSPITAL_COMMUNITY): Payer: Medicare Other

## 2015-01-28 MED ORDER — POTASSIUM CHLORIDE IN NACL 20-0.9 MEQ/L-% IV SOLN
INTRAVENOUS | Status: DC
Start: 2015-01-28 — End: 2015-01-30
  Administered 2015-01-28 – 2015-01-30 (×3): via INTRAVENOUS
  Filled 2015-01-28 (×4): qty 1000

## 2015-01-28 MED ORDER — METHOCARBAMOL 500 MG PO TABS: 500.0000 mg | ORAL_TABLET | Freq: Four times a day (QID) | ORAL | Status: DC | PRN

## 2015-01-28 MED ORDER — OXYCODONE HCL 5 MG PO TABS
5.0000 mg | ORAL_TABLET | ORAL | Status: DC | PRN
  Administered 2015-01-28: 5 mg via ORAL
  Administered 2015-01-28: 10 mg via ORAL
  Filled 2015-01-28 (×2): qty 2

## 2015-01-28 MED ORDER — PANTOPRAZOLE SODIUM 40 MG PO TBEC
40.0000 mg | DELAYED_RELEASE_TABLET | Freq: Every day | ORAL | Status: DC
  Administered 2015-01-28 – 2015-01-30 (×3): 40 mg via ORAL
  Filled 2015-01-28 (×3): qty 1

## 2015-01-28 MED ORDER — CEFAZOLIN SODIUM-DEXTROSE 2-3 GM-% IV SOLR
2.0000 g | INTRAVENOUS | Status: AC
  Administered 2015-01-29: 2 g via INTRAVENOUS

## 2015-01-28 MED ORDER — HYDROCODONE-ACETAMINOPHEN 5-325 MG PO TABS
1.0000 | ORAL_TABLET | Freq: Four times a day (QID) | ORAL | Status: DC | PRN
  Administered 2015-01-28: 2 via ORAL
  Administered 2015-01-28: 1 via ORAL
  Administered 2015-01-30: 2 via ORAL
  Filled 2015-01-28 (×3): qty 2
  Filled 2015-01-28: qty 1

## 2015-01-28 NOTE — Progress Notes (Signed)
Orthopaedic Trauma Service Progress Note  Subjective  Pt seen and evaluated Full dictation pending- #[id number]  Pt slipped on  Hill yesterday while blowing leaves. Sustained distal tibia fracture and fibular shaft fracture Br[facility] could assume management   Bone did not come through skin    C/o R ankle pain No numbness or tingling at current time Denies any additional acute issues Pt does have bunion to R great toe, this limits her motion at baseline  Does not some subacute R thumb pain/popping. No frank pain but flexor tendon appears to pop over IP joint  Review of Systems  Constitutional: Negative for fever and chills.  HENT: Negative for hearing loss.   Eyes: Negative for blurred vision.  Respiratory: Negative for shortness of breath and wheezing.   Cardiovascular: Negative for chest pain and palpitations.  Gastrointestinal: Negative for nausea and vomiting.  Genitourinary: Negative for dysuria.  Neurological: Negative for tingling, sensory change and headaches.     Objective   BP 117/70 mmHg  Pulse 56  Temp(Src) 98.3 F (36.8 C) (Oral)  Resp 17  Ht '5\' 2"'$  (1.575 m)  Wt 72.802 kg (160 lb 8 oz)  BMI 29.35 kg/m2  SpO2 100%  Intake/Output      12/13 0701 - 12/14 0700 12/14 0701 - 12/15 0700   P.O. 240    Total Intake(mL/kg) 240 (3.3)    Urine (mL/kg/hr) 300    Total Output 300     Net -60          Urine Occurrence 1 x      Labs  Results for [name], [name] (MRN [id number]) as of 01/28/2015 09:54  Ref. Range 01/27/2015 13:58  Sodium Latest Ref Range: 135-145 mmol/L 139  Potassium Latest Ref Range: 3.5-5.1 mmol/L 3.9  Chloride Latest Ref Range: 101-111 mmol/L 101  CO2 Latest Ref Range: 22-32 mmol/L 24  BUN Latest Ref Range: 6-20 mg/dL 13  Creatinine Latest Ref Range: 0.44-1.00 mg/dL 0.86  Calcium Latest Ref Range: 8.9-10.3 mg/dL 10.0  EGFR (Non-African Amer.) Latest Ref Range: >60 mL/min >60   EGFR (African American) Latest Ref Range: >60 mL/min >60  Glucose Latest Ref Range: 65-99 mg/dL 135 (H)  Anion gap Latest Ref Range: 5-15  14  WBC Latest Ref Range: 4.0-10.5 K/uL 14.0 (H)  RBC Latest Ref Range: 3.87-5.11 MIL/uL 5.23 (H)  Hemoglobin Latest Ref Range: 12.0-15.0 g/dL 17.1 (H)  HCT Latest Ref Range: 36.0-46.0 % 47.9 (H)  MCV Latest Ref Range: 78.0-100.0 fL 91.6  MCH Latest Ref Range: 26.0-34.0 pg 32.7  MCHC Latest Ref Range: 30.0-36.0 g/dL 35.7  RDW Latest Ref Range: 11.5-15.5 % 12.4  Platelets Latest Ref Range: 150-400 K/uL 222  Neutrophils Latest Units: % 87  Lymphocytes Latest Units: % 8  Monocytes Relative Latest Units: % 5  Eosinophil Latest Units: % 0  Basophil Latest Units: % 0  NEUT# Latest Ref Range: 1.7-7.7 K/uL 12.1 (H)  Lymphocyte # Latest Ref Range: 0.7-4.0 K/uL 1.1  Monocyte # Latest Ref Range: 0.1-1.0 K/uL 0.8  Eosinophils Absolute Latest Ref Range: 0.0-0.7 K/uL 0.0  Basophils Absolute Latest Ref Range: 0.0-0.1 K/uL 0.0    Exam  Gen: resting comfortably in bed, NAD Lungs: clear B  Cardiac: RRR, S1 and S2 Abd: + BS, NTND Pelvis: no instability  Ext:       Right Lower Extremity   Splint fitting well  Leg is well elevated  DPN, SPN, TN sensation  grossly intact  EHL,FHL and lesser toe motor functions grossly intact  Ext warm   + DP pulse  Swelling appears to be well controlled  Anterior aspect of splint was split to eval soft tissue at ankle joint level    Skin wrinkles with compression    No blisters or wounds noted over areas observed   No pain with passive stretching of lower leg compartments   Imaging  CT R tibia and ankle: comminuted R tibial shaft fracture with extension to joint line. 3 separate joint fragments noted. Joint is relatively congruent. Comminuted fibular shaft fx note. ? Some widening of fibula in incisura   Assessment and Plan   POD/HD#: 1   69 y/o female s/p ground level fall with comminuted R tibal shaft and pilon  fracture with fibular shaft fracture  1. Fall  2. Comminuted R tibial shaft and pilon fracture, R fibular shaft fracture  Pt will need surgical stabilization of fracture  Plan for OR tomorrow   Anticipate percutaneous screws to stabilize articular block followed by IMN    Will evaluate syndesmosis intra op as well    Pt will be NWB post op for 6-8 weeks  Continue with aggressive ice and elevation   Toe motion ok    3. Pain management:  norco  Oxy ir   Morphine  4. Hemodynamics  BP stable  Will hold HCTZ in periop period   5. Medical issues   HTN   See #4   Hyperlipidemia    Home meds  6. DVT/PE prophylaxis:  scds for now  lovenox post op x 21 days   7. Metabolic Bone Disease:  Will check basic lab markers including vitamin D  Pt will need DEXA as outpt    Concerned for osteoporosis given mechanism of fall  8. Activity:  Bed rest for today  Pt can try to get to chair if she wishes   9. FEN/GI prophylaxis/Foley/Lines:  Diet as tolerated  NPO after MN  protonix   Pt K is low normal. Dc dextrose IVF and give NS with 20 mEq of K    10. Dispo:  OR tomorrow for ORIF/IMN R tibia   Would anticipate pt dc'd to home over weekend     Jari Pigg, PA-C [facility] [phone] (O) 01/28/2015 9:50 AM

## 2015-01-29 ENCOUNTER — Inpatient Hospital Stay (HOSPITAL_COMMUNITY): Payer: Medicare Other | Admitting: Certified Registered Nurse Anesthetist

## 2015-01-29 ENCOUNTER — Encounter (HOSPITAL_COMMUNITY)
Admission: EM | Disposition: A | Discharge status: Home Health | Payer: Self-pay | Source: Home / Self Care | Attending: Orthopedic Surgery

## 2015-01-29 ENCOUNTER — Inpatient Hospital Stay (HOSPITAL_COMMUNITY): Payer: Medicare Other

## 2015-01-29 HISTORY — PX: ORIF TIBIA FRACTURE: SHX5416

## 2015-01-29 LAB — SURGICAL PCR SCREEN
MRSA, PCR: NEGATIVE
Staphylococcus aureus: NEGATIVE

## 2015-01-29 LAB — ABO/RH: ABO/RH(D): A NEG

## 2015-01-29 LAB — MAGNESIUM: Magnesium: 1.9 mg/dL (ref 1.7–2.4)

## 2015-01-29 LAB — CBC
HEMATOCRIT: 42.4 % (ref 36.0–46.0)
HEMATOCRIT: 40.7 % (ref 36.0–46.0)
HEMOGLOBIN: 14.1 g/dL (ref 12.0–15.0)
Hemoglobin: 14.2 g/dL (ref 12.0–15.0)
MCH: 31.3 pg (ref 26.0–34.0)
MCH: 32.1 pg (ref 26.0–34.0)
MCHC: 33.5 g/dL (ref 30.0–36.0)
MCHC: 34.6 g/dL (ref 30.0–36.0)
MCV: 93.6 fL (ref 78.0–100.0)
MCV: 92.7 fL (ref 78.0–100.0)
PLATELETS: 183 10*3/uL (ref 150–400)
Platelets: 193 10*3/uL (ref 150–400)
RBC: 4.53 MIL/uL (ref 3.87–5.11)
RBC: 4.39 MIL/uL (ref 3.87–5.11)
RDW: 12.8 % (ref 11.5–15.5)
RDW: 12.3 % (ref 11.5–15.5)
WBC: 6.6 10*3/uL (ref 4.0–10.5)
WBC: 7.1 10*3/uL (ref 4.0–10.5)

## 2015-01-29 LAB — COMPREHENSIVE METABOLIC PANEL
ALT: 23 U/L (ref 14–54)
ANION GAP: 9 (ref 5–15)
AST: 29 U/L (ref 15–41)
Albumin: 3.6 g/dL (ref 3.5–5.0)
Alkaline Phosphatase: 57 U/L (ref 38–126)
BILIRUBIN TOTAL: 1 mg/dL (ref 0.3–1.2)
BUN: 10 mg/dL (ref 6–20)
CHLORIDE: 105 mmol/L (ref 101–111)
CO2: 27 mmol/L (ref 22–32)
Calcium: 9.1 mg/dL (ref 8.9–10.3)
Creatinine, Ser: 0.77 mg/dL (ref 0.44–1.00)
Glucose, Bld: 106 mg/dL — ABNORMAL HIGH (ref 65–99)
POTASSIUM: 3.5 mmol/L (ref 3.5–5.1)
Sodium: 141 mmol/L (ref 135–145)
Total Protein: 6.3 g/dL — ABNORMAL LOW (ref 6.5–8.1)

## 2015-01-29 LAB — CREATININE, SERUM
Creatinine, Ser: 0.82 mg/dL (ref 0.44–1.00)
GFR calc Af Amer: >60 mL/min (ref >60)
GFR calc non Af Amer: >60 mL/min (ref >60)

## 2015-01-29 LAB — APTT: APTT: 28 s (ref 24–37)

## 2015-01-29 LAB — TSH: TSH: 3.813 u[IU]/mL (ref 0.350–4.500)

## 2015-01-29 LAB — TYPE AND SCREEN
ABO/RH(D): A NEG
ANTIBODY SCREEN: NEGATIVE

## 2015-01-29 LAB — PHOSPHORUS: Phosphorus: 3.2 mg/dL (ref 2.5–4.6)

## 2015-01-29 LAB — TRANSFERRIN: Transferrin: 236 mg/dL (ref 192–382)

## 2015-01-29 LAB — PROTIME-INR
INR: 1.06 (ref 0.00–1.49)
Prothrombin Time: 14 seconds (ref 11.6–15.2)

## 2015-01-29 LAB — PREALBUMIN: PREALBUMIN: 20.2 mg/dL (ref 18–38)

## 2015-01-29 SURGERY — OPEN REDUCTION INTERNAL FIXATION (ORIF) TIBIA FRACTURE
Anesthesia: General | Site: Leg Lower | Laterality: Right

## 2015-01-29 MED ORDER — PROPOFOL 10 MG/ML IV BOLUS
INTRAVENOUS | Status: AC
  Filled 2015-01-29: qty 20

## 2015-01-29 MED ORDER — SIMVASTATIN 40 MG PO TABS
40.0000 mg | ORAL_TABLET | Freq: Every day | ORAL | Status: DC
  Administered 2015-01-29: 40 mg via ORAL
  Filled 2015-01-29: qty 1

## 2015-01-29 MED ORDER — OXYCODONE HCL 5 MG PO TABS
5.0000 mg | ORAL_TABLET | Freq: Once | ORAL | Status: AC | PRN
  Administered 2015-01-29: 5 mg via ORAL

## 2015-01-29 MED ORDER — CEFAZOLIN SODIUM 1-5 GM-% IV SOLN
1.0000 g | Freq: Four times a day (QID) | INTRAVENOUS | Status: AC
  Administered 2015-01-29 – 2015-01-30 (×3): 1 g via INTRAVENOUS
  Filled 2015-01-29 (×3): qty 50

## 2015-01-29 MED ORDER — ONDANSETRON HCL 4 MG/2ML IJ SOLN
INTRAMUSCULAR | Status: AC
  Filled 2015-01-29: qty 2

## 2015-01-29 MED ORDER — PHENYLEPHRINE HCL 10 MG/ML IJ SOLN
INTRAMUSCULAR | Status: DC | PRN
  Administered 2015-01-29: 40 ug via INTRAVENOUS
  Administered 2015-01-29 (×2): 80 ug via INTRAVENOUS
  Administered 2015-01-29: 60 ug via INTRAVENOUS

## 2015-01-29 MED ORDER — ROCURONIUM BROMIDE 100 MG/10ML IV SOLN
INTRAVENOUS | Status: DC | PRN
  Administered 2015-01-29: 50 mg via INTRAVENOUS
  Administered 2015-01-29: 10 mg via INTRAVENOUS
  Administered 2015-01-29: 15 mg via INTRAVENOUS

## 2015-01-29 MED ORDER — NEOSTIGMINE METHYLSULFATE 10 MG/10ML IV SOLN
INTRAVENOUS | Status: AC
  Filled 2015-01-29: qty 1

## 2015-01-29 MED ORDER — ACETAMINOPHEN 10 MG/ML IV SOLN
INTRAVENOUS | Status: DC | PRN
  Administered 2015-01-29: 1000 mg via INTRAVENOUS

## 2015-01-29 MED ORDER — ACETAMINOPHEN 10 MG/ML IV SOLN
INTRAVENOUS | Status: AC
Start: 2015-01-29 — End: 2015-01-29
  Filled 2015-01-29: qty 100

## 2015-01-29 MED ORDER — LIDOCAINE HCL (CARDIAC) 20 MG/ML IV SOLN
INTRAVENOUS | Status: DC | PRN
  Administered 2015-01-29: 50 mg via INTRAVENOUS

## 2015-01-29 MED ORDER — GLYCOPYRROLATE 0.2 MG/ML IJ SOLN
INTRAMUSCULAR | Status: DC | PRN
  Administered 2015-01-29: 0.1 mg via INTRAVENOUS
  Administered 2015-01-29: 0.4 mg via INTRAVENOUS

## 2015-01-29 MED ORDER — PROPOFOL 10 MG/ML IV BOLUS
INTRAVENOUS | Status: DC | PRN
  Administered 2015-01-29: 100 mg via INTRAVENOUS

## 2015-01-29 MED ORDER — DEXAMETHASONE SODIUM PHOSPHATE 10 MG/ML IJ SOLN
INTRAMUSCULAR | Status: DC | PRN
  Administered 2015-01-29: 10 mg via INTRAVENOUS

## 2015-01-29 MED ORDER — METOCLOPRAMIDE HCL 5 MG PO TABS: 5.0000 mg | ORAL_TABLET | Freq: Three times a day (TID) | ORAL | Status: DC | PRN

## 2015-01-29 MED ORDER — FENTANYL CITRATE (PF) 250 MCG/5ML IJ SOLN
INTRAMUSCULAR | Status: AC
  Filled 2015-01-29: qty 5

## 2015-01-29 MED ORDER — EPHEDRINE SULFATE 50 MG/ML IJ SOLN
INTRAMUSCULAR | Status: DC | PRN
  Administered 2015-01-29: 10 mg via INTRAVENOUS

## 2015-01-29 MED ORDER — MIDAZOLAM HCL 2 MG/2ML IJ SOLN
INTRAMUSCULAR | Status: AC
  Filled 2015-01-29: qty 2

## 2015-01-29 MED ORDER — HYDROMORPHONE HCL 1 MG/ML IJ SOLN
INTRAMUSCULAR | Status: AC
  Filled 2015-01-29: qty 1

## 2015-01-29 MED ORDER — DEXAMETHASONE SODIUM PHOSPHATE 10 MG/ML IJ SOLN
INTRAMUSCULAR | Status: AC
  Filled 2015-01-29: qty 1

## 2015-01-29 MED ORDER — ONDANSETRON HCL 4 MG/2ML IJ SOLN: 4.0000 mg | Freq: Four times a day (QID) | INTRAMUSCULAR | Status: DC | PRN

## 2015-01-29 MED ORDER — LIDOCAINE HCL (CARDIAC) 20 MG/ML IV SOLN
INTRAVENOUS | Status: AC
  Filled 2015-01-29: qty 5

## 2015-01-29 MED ORDER — FENTANYL CITRATE (PF) 100 MCG/2ML IJ SOLN
INTRAMUSCULAR | Status: AC
  Filled 2015-01-29: qty 2

## 2015-01-29 MED ORDER — SUCCINYLCHOLINE CHLORIDE 20 MG/ML IJ SOLN
INTRAMUSCULAR | Status: AC
  Filled 2015-01-29: qty 1

## 2015-01-29 MED ORDER — OXYCODONE HCL 5 MG/5ML PO SOLN: 5.0000 mg | Freq: Once | ORAL | Status: AC | PRN

## 2015-01-29 MED ORDER — FENTANYL CITRATE (PF) 100 MCG/2ML IJ SOLN
INTRAMUSCULAR | Status: DC | PRN
  Administered 2015-01-29: 100 ug via INTRAVENOUS
  Administered 2015-01-29: 25 ug via INTRAVENOUS
  Administered 2015-01-29: 50 ug via INTRAVENOUS

## 2015-01-29 MED ORDER — ONDANSETRON HCL 4 MG PO TABS: 4.0000 mg | ORAL_TABLET | Freq: Four times a day (QID) | ORAL | Status: DC | PRN

## 2015-01-29 MED ORDER — CEFAZOLIN SODIUM-DEXTROSE 2-3 GM-% IV SOLR
INTRAVENOUS | Status: AC
  Filled 2015-01-29: qty 50

## 2015-01-29 MED ORDER — SCOPOLAMINE 1 MG/3DAYS TD PT72
MEDICATED_PATCH | TRANSDERMAL | Status: DC
Start: 2015-01-29 — End: 2015-01-30
  Administered 2015-01-29: 1.5 mg via TRANSDERMAL
  Filled 2015-01-29: qty 1

## 2015-01-29 MED ORDER — DIPHENHYDRAMINE HCL 50 MG/ML IJ SOLN
INTRAMUSCULAR | Status: DC | PRN
  Administered 2015-01-29: 6.25 mg via INTRAVENOUS

## 2015-01-29 MED ORDER — ACETAMINOPHEN 325 MG PO TABS: 650.0000 mg | ORAL_TABLET | Freq: Four times a day (QID) | ORAL | Status: DC | PRN

## 2015-01-29 MED ORDER — 0.9 % SODIUM CHLORIDE (POUR BTL) OPTIME
TOPICAL | Status: DC | PRN
  Administered 2015-01-29: 1000 mL

## 2015-01-29 MED ORDER — OXYCODONE HCL 5 MG PO TABS
ORAL_TABLET | ORAL | Status: AC
  Filled 2015-01-29: qty 1

## 2015-01-29 MED ORDER — ENOXAPARIN SODIUM 40 MG/0.4ML ~~LOC~~ SOLN
40.0000 mg | SUBCUTANEOUS | Status: DC
  Administered 2015-01-30: 40 mg via SUBCUTANEOUS
  Filled 2015-01-29: qty 0.4

## 2015-01-29 MED ORDER — LACTATED RINGERS IV SOLN
INTRAVENOUS | Status: DC
  Administered 2015-01-29 (×3): via INTRAVENOUS

## 2015-01-29 MED ORDER — OXYCODONE HCL 5 MG PO TABS
5.0000 mg | ORAL_TABLET | ORAL | Status: DC | PRN
  Administered 2015-01-30 (×3): 10 mg via ORAL
  Filled 2015-01-29 (×3): qty 2

## 2015-01-29 MED ORDER — MIDAZOLAM HCL 5 MG/5ML IJ SOLN
INTRAMUSCULAR | Status: DC | PRN
  Administered 2015-01-29: 2 mg via INTRAVENOUS

## 2015-01-29 MED ORDER — GLYCOPYRROLATE 0.2 MG/ML IJ SOLN
INTRAMUSCULAR | Status: AC
  Filled 2015-01-29: qty 1

## 2015-01-29 MED ORDER — PHENYLEPHRINE 40 MCG/ML (10ML) SYRINGE FOR IV PUSH (FOR BLOOD PRESSURE SUPPORT)
PREFILLED_SYRINGE | INTRAVENOUS | Status: AC
  Filled 2015-01-29: qty 10

## 2015-01-29 MED ORDER — DIPHENHYDRAMINE HCL 50 MG/ML IJ SOLN
INTRAMUSCULAR | Status: AC
  Filled 2015-01-29: qty 1

## 2015-01-29 MED ORDER — HYDROMORPHONE HCL 1 MG/ML IJ SOLN
0.2500 mg | INTRAMUSCULAR | Status: DC | PRN
  Administered 2015-01-29 (×2): 0.5 mg via INTRAVENOUS

## 2015-01-29 MED ORDER — EPHEDRINE SULFATE 50 MG/ML IJ SOLN
INTRAMUSCULAR | Status: AC
  Filled 2015-01-29: qty 1

## 2015-01-29 MED ORDER — METOCLOPRAMIDE HCL 5 MG/ML IJ SOLN: 5.0000 mg | Freq: Three times a day (TID) | INTRAMUSCULAR | Status: DC | PRN

## 2015-01-29 MED ORDER — NEOSTIGMINE METHYLSULFATE 10 MG/10ML IV SOLN
INTRAVENOUS | Status: DC | PRN
  Administered 2015-01-29: 3 mg via INTRAVENOUS

## 2015-01-29 MED ORDER — ROCURONIUM BROMIDE 50 MG/5ML IV SOLN
INTRAVENOUS | Status: AC
  Filled 2015-01-29: qty 1

## 2015-01-29 MED ORDER — LACTATED RINGERS IV SOLN: INTRAVENOUS | Status: DC

## 2015-01-29 MED ORDER — SCOPOLAMINE 1 MG/3DAYS TD PT72
1.0000 | MEDICATED_PATCH | Freq: Once | TRANSDERMAL | Status: DC
  Administered 2015-01-29: 1.5 mg via TRANSDERMAL
  Filled 2015-01-29: qty 1

## 2015-01-29 MED ORDER — MORPHINE SULFATE (PF) 2 MG/ML IV SOLN
1.0000 mg | INTRAVENOUS | Status: DC | PRN
  Administered 2015-01-30: 2 mg via INTRAVENOUS
  Filled 2015-01-29: qty 1

## 2015-01-29 MED ORDER — ACETAMINOPHEN 650 MG RE SUPP: 650.0000 mg | Freq: Four times a day (QID) | RECTAL | Status: DC | PRN

## 2015-01-29 SURGICAL SUPPLY — 98 items
BANDAGE ACE 4X5 VEL STRL LF (GAUZE/BANDAGES/DRESSINGS) ×3 IMPLANT
BANDAGE ELASTIC 4 VELCRO ST LF (GAUZE/BANDAGES/DRESSINGS) ×3 IMPLANT
BANDAGE ELASTIC 6 VELCRO ST LF (GAUZE/BANDAGES/DRESSINGS) ×3 IMPLANT
BANDAGE ESMARK 6X9 LF (GAUZE/BANDAGES/DRESSINGS) ×1 IMPLANT
BIT DRILL 2.5X2.75 QC CALB (BIT) ×3 IMPLANT
BIT DRILL 2.9 CANN QC NONSTRL (BIT) ×3 IMPLANT
BIT DRILL 3.5X5.5 QC CALB (BIT) ×3 IMPLANT
BIT DRILL CALIBRATED 2.7 (BIT) ×2 IMPLANT
BIT DRILL CALIBRATED 2.7MM (BIT) ×1
BLADE SURG 10 STRL SS (BLADE) ×3 IMPLANT
BLADE SURG 15 STRL LF DISP TIS (BLADE) ×1 IMPLANT
BLADE SURG 15 STRL SS (BLADE) ×2
BLADE SURG ROTATE 9660 (MISCELLANEOUS) IMPLANT
BNDG COHESIVE 4X5 TAN STRL (GAUZE/BANDAGES/DRESSINGS) ×3 IMPLANT
BNDG ESMARK 6X9 LF (GAUZE/BANDAGES/DRESSINGS) ×3
BNDG GAUZE ELAST 4 BULKY (GAUZE/BANDAGES/DRESSINGS) ×3 IMPLANT
BRUSH SCRUB DISP (MISCELLANEOUS) ×6 IMPLANT
CANISTER SUCTION 2500CC (MISCELLANEOUS) ×3 IMPLANT
COVER MAYO STAND STRL (DRAPES) ×3 IMPLANT
DRAPE C-ARM 42X72 X-RAY (DRAPES) ×3 IMPLANT
DRAPE C-ARMOR (DRAPES) ×3 IMPLANT
DRAPE INCISE IOBAN 66X45 STRL (DRAPES) ×3 IMPLANT
DRAPE ORTHO SPLIT 77X108 STRL (DRAPES)
DRAPE SURG ORHT 6 SPLT 77X108 (DRAPES) IMPLANT
DRAPE U-SHAPE 47X51 STRL (DRAPES) ×3 IMPLANT
DRSG ADAPTIC 3X8 NADH LF (GAUZE/BANDAGES/DRESSINGS) ×3 IMPLANT
DRSG PAD ABDOMINAL 8X10 ST (GAUZE/BANDAGES/DRESSINGS) ×12 IMPLANT
ELECT REM PT RETURN 9FT ADLT (ELECTROSURGICAL) ×3
ELECTRODE REM PT RTRN 9FT ADLT (ELECTROSURGICAL) ×1 IMPLANT
EVACUATOR 1/8 PVC DRAIN (DRAIN) IMPLANT
EVACUATOR 3/16  PVC DRAIN (DRAIN)
EVACUATOR 3/16 PVC DRAIN (DRAIN) IMPLANT
GAUZE SPONGE 4X4 12PLY STRL (GAUZE/BANDAGES/DRESSINGS) ×3 IMPLANT
GLOVE BIO SURGEON STRL SZ7.5 (GLOVE) ×3 IMPLANT
GLOVE BIO SURGEON STRL SZ8 (GLOVE) ×3 IMPLANT
GLOVE BIOGEL PI IND STRL 7.5 (GLOVE) ×1 IMPLANT
GLOVE BIOGEL PI IND STRL 8 (GLOVE) ×1 IMPLANT
GLOVE BIOGEL PI INDICATOR 7.5 (GLOVE) ×2
GLOVE BIOGEL PI INDICATOR 8 (GLOVE) ×2
GLOVE PROGUARD SZ 7 1/2 (GLOVE) ×3 IMPLANT
GOWN STRL REUS W/ TWL LRG LVL3 (GOWN DISPOSABLE) ×2 IMPLANT
GOWN STRL REUS W/ TWL XL LVL3 (GOWN DISPOSABLE) ×1 IMPLANT
GOWN STRL REUS W/TWL LRG LVL3 (GOWN DISPOSABLE) ×4
GOWN STRL REUS W/TWL XL LVL3 (GOWN DISPOSABLE) ×2
IMMOBILIZER KNEE 22 UNIV (SOFTGOODS) ×3 IMPLANT
K-WIRE ACE 1.6X6 (WIRE) ×9
KIT BASIN OR (CUSTOM PROCEDURE TRAY) ×3 IMPLANT
KIT ROOM TURNOVER OR (KITS) ×3 IMPLANT
KWIRE ACE 1.6X6 (WIRE) ×3 IMPLANT
MANIFOLD NEPTUNE II (INSTRUMENTS) IMPLANT
NDL SUT .5 MAYO 1.404X.05X (NEEDLE) IMPLANT
NDL SUT 6 .5 CRC .975X.05 MAYO (NEEDLE) IMPLANT
NEEDLE 22X1 1/2 (OR ONLY) (NEEDLE) IMPLANT
NEEDLE MAYO TAPER (NEEDLE)
NS IRRIG 1000ML POUR BTL (IV SOLUTION) ×3 IMPLANT
PACK ORTHO EXTREMITY (CUSTOM PROCEDURE TRAY) ×3 IMPLANT
PAD ABD 8X10 STRL (GAUZE/BANDAGES/DRESSINGS) ×3 IMPLANT
PAD ARMBOARD 7.5X6 YLW CONV (MISCELLANEOUS) ×6 IMPLANT
PAD CAST 4YDX4 CTTN HI CHSV (CAST SUPPLIES) ×1 IMPLANT
PADDING CAST COTTON 4X4 STRL (CAST SUPPLIES) ×2
PADDING CAST COTTON 6X4 STRL (CAST SUPPLIES) ×3 IMPLANT
PLATE 12H 226 RT MED DIST TIB (Plate) ×3 IMPLANT
PLATE ACE 3.5MM 2HOLE (Plate) ×3 IMPLANT
SCREW 3.5MM CORT LP 34MM (Screw) ×3 IMPLANT
SCREW ACE CAN 4.0 42M (Screw) ×3 IMPLANT
SCREW ACE CAN 4.0 48M (Screw) ×3 IMPLANT
SCREW CORT 3.5X26 815037026 (Screw) ×9 IMPLANT
SCREW CORT 3.5X40 (Screw) ×3 IMPLANT
SCREW CORT 3.5X46 815037046 (Screw) ×3 IMPLANT
SCREW CORTICAL 3.5MM  42MM (Screw) ×2 IMPLANT
SCREW CORTICAL 3.5MM 42MM (Screw) ×1 IMPLANT
SCREW CORTICAL 3.5MM 50MM (Screw) ×3 IMPLANT
SCREW LOCK 3.5X44 DIST TIB (Screw) ×3 IMPLANT
SCREW LOCK CORT STAR 3.5X18 (Screw) ×3 IMPLANT
SCREW LOCK CORT STAR 3.5X44 (Screw) ×3 IMPLANT
SPLINT PLASTER CAST XFAST 5X30 (CAST SUPPLIES) ×1 IMPLANT
SPLINT PLASTER XFAST SET 5X30 (CAST SUPPLIES) ×2
SPONGE LAP 18X18 X RAY DECT (DISPOSABLE) ×3 IMPLANT
STAPLER VISISTAT 35W (STAPLE) ×3 IMPLANT
STOCKINETTE IMPERVIOUS LG (DRAPES) IMPLANT
SUCTION FRAZIER TIP 10 FR DISP (SUCTIONS) ×6 IMPLANT
SUT ETHILON 3 0 PS 1 (SUTURE) IMPLANT
SUT PROLENE 0 CT 2 (SUTURE) ×6 IMPLANT
SUT VIC AB 0 CT1 27 (SUTURE) ×2
SUT VIC AB 0 CT1 27XBRD ANBCTR (SUTURE) ×1 IMPLANT
SUT VIC AB 1 CT1 27 (SUTURE) ×2
SUT VIC AB 1 CT1 27XBRD ANBCTR (SUTURE) ×1 IMPLANT
SUT VIC AB 2-0 CT1 27 (SUTURE) ×4
SUT VIC AB 2-0 CT1 TAPERPNT 27 (SUTURE) ×2 IMPLANT
SYR 20ML ECCENTRIC (SYRINGE) IMPLANT
TOWEL OR 17X24 6PK STRL BLUE (TOWEL DISPOSABLE) ×3 IMPLANT
TOWEL OR 17X26 10 PK STRL BLUE (TOWEL DISPOSABLE) ×6 IMPLANT
TRAY FOLEY CATH 16FRSI W/METER (SET/KITS/TRAYS/PACK) IMPLANT
TUBE CONNECTING 12'X1/4 (SUCTIONS) ×1
TUBE CONNECTING 12X1/4 (SUCTIONS) ×2 IMPLANT
WASHER CUP TIMAX (Trauma) ×3 IMPLANT
WATER STERILE IRR 1000ML POUR (IV SOLUTION) IMPLANT
YANKAUER SUCT BULB TIP NO VENT (SUCTIONS) ×6 IMPLANT

## 2015-01-29 NOTE — Consult Note (Signed)
NAME:  [name], Alyssa                  ACCOUNT NO.:  [id number]  MEDICAL RECORD NO.:  [id number]  LOCATION:  6N24C                        FACILITY:  [facility]  PHYSICIAN:  Doralee AlbinoMichael H. Carola FrostHandy, M.D. DATE OF BIRTH:  [birth year]  DATE OF CONSULTATION:  01/28/2015 DATE OF DISCHARGE:                                CONSULTATION   CHIEF COMPLAINT:  Fall with comminuted right distal tibia fracture.  REQUESTING PHYSICIAN:  Jones BroomJustin Chandler, MD  HISTORY OF PRESENT ILLNESS:  [name] is a very pleasant 69 year old white female, who was at home yesterday afternoon blowing some leaves with a backpack blower on a hill when she slipped and twisted her right ankle.  The patient did not really fall down the hill, she simply slipped and twisted her ankle at the same time.  She was eventually found by her husband and she was unable to bear weight on the right leg. She was brought to [facility] for evaluation where she was found to have a comminuted right tibial shaft fracture with extension down to the tibial plafond.  She was also noted to have a comminuted right fibula shaft fracture above the zone of her tibia fracture.  Given the complexity of the injury, Dr. Ave Filterhandler contacted the Orthopedic Trauma Service to see if we could assume management, we agreed.  The patient was subsequently transferred to [facility] for evaluation by our service.  She was reduced and splinted prior to departing the ER at [facility].  The patient was seen and evaluated today on January 28, 2015, room 6N24.  She appears to be quite comfortable, but does complain of pretty moderate right ankle pain.  She denies any additional injuries elsewhere.  Denies any numbness or tingling.  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 Additionally, she also reports some subacute right thumb pain and popping.  No frank pain today, but her flexor tendon does appear to pop over the IP  joint.  The patient does have a PCP, Dr. Hyman HopesWebb at [facility] [place].  She does not take any vitamin D or calcium supplements. She reports last DEXA scan was several years ago, but was none on any pharmacologic treatment for any osteoporosis or osteopenia.  REVIEW OF SYSTEMS:  CONSTITUTIONAL:  Negative for fevers or chills. HEENT:  Negative for hearing loss.  EYES:  Negative for blurred vision. RESPIRATORY:  Negative for shortness of breath or wheezing. CARDIOVASCULAR:  Negative for chest pain or palpitations.  GI:  Negative for nausea or vomiting.  GU:  Negative for dysuria.  NEUROLOGIC: Negative for any tingling or sensory changes.  No headaches.  PAST MEDICAL HISTORY:  Notable for hypertension and hypercholesterolemia.  SURGICAL HISTORY:  Notable for laparoscopy for fibroids.  FAMILY HISTORY:  CVA, mother.  CAD, dad.  Heart attack, sister.  SOCIAL HISTORY:  [place]  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  Originally from [place], [place].  Lives here with her husband.  Her son lives in town as well.  The patient does not smoke.  She does drink wine 3-4 glasses a week.  ALLERGIES:  No known drug allergies.  MEDICATIONS:  Prior to admission include Allegra, Flonase, HCTZ, simvastatin, and OTC ibuprofen and aspirin.  PHYSICAL EXAMINATION:  VITAL SIGNS:  BP is 117/70, heart rate is 56, respirations 17, 100% on room air, temperature 98.3.  Weight is 72.8 kg. BMI 29.35 kg/m2. GENERAL:  The patient is resting comfortably.  No acute distress. Appears younger than stated age. HEENT:  Head is atraumatic.  Extraocular muscles are intact.  Moist mucous membranes are noted. NECK:  No pain with palpation.  Supple. LUNGS:  Clear to auscultation bilaterally. CARDIAC:  Regular rate and rhythm, S1 and S2. ABDOMEN:  Soft, nontender.  Positive bowel sounds. PELVIS:  No instability is appreciated.  No gross motion. EXTREMITIES:   Bilateral upper extremities are free of any gross deformities.  No crepitus or blocked motion.  Full active and passive motion is noted.  On examination of her right thumb, no deformities noted, but with IP flexion and extension, tendons are noted to pop over the IP joint, presumably her flexor tendons.  She is nontender over this area.  No erythema, wounds or lesions noted.  Remainder of her hand and wrist is unremarkable.  No nodules are palpated.  Right lower extremity, hip and knee are without any gross deformities.  Splint is fitting well to the lower leg.  The leg is elevated.  DPN, SPN, and TN sensory functions are intact.  EHL, FHL, and lesser toe motor function are grossly intact.  Extremities are warm, palpable dorsalis pedis pulses noted.  Swelling appears to be very well controlled.  No pain with passive motion of her compartments of the lower leg.  The anterior aspect of the splint was partially split to evaluate her soft tissue. Swelling is extraordinarily well controlled at this point.  Skin wrinkles with gentle compression along the anterior ankle.  I did not appreciate any blisters or wounds over the area that was exposed.  Knee is nontender with evaluation.  Hip is nontender as well.  No pain with palpation along her thigh.  Full stability exam of her knee was not performed at this time.  Left lower extremity is unremarkable.  No blocks to motion.  Motor and sensory function are intact.  Hip, knee and ankle are stable.  Extremities are warm with palpable dorsalis pedis pulse.  IMAGING:  CT scan of right tibia and ankle demonstrates a comminuted right tibial shaft fracture with extension to the joint line.  There are large in separate joint fragments at the distal tibia.  Joint is relatively congruent.  There is comminuted fibular shaft fracture also noted above the tibia fracture.  Also questions some possible widening of the fibula relative to the incisura of the  tibia.  ASSESSMENT AND PLAN:  A 69 year old female, status post ground-level fall with comminuted right tibial shaft and pilon fracture as well as a fibular shaft fracture. 1. Fall. 2. Comminuted right tibial shaft and pilon fracture and right fibular     shaft fracture.  The patient will need a surgical stabilization to     address her fracture.  We will plan for the OR tomorrow as her     swelling appears to be in good shape to allow for this.  I would     anticipate percutaneous screw to stabilize the articular block     followed by IM nail;however, we could also consider plating if we     feel that this is more appropriate.  We also evaluate  her     syndesmoses intraoperatively as well and fix this as needed.  The     patient will be nonweightbearing for now and for 6-8 weeks postop.     Continue with aggressive ice and elevation.  Toe motion is okay at     this time.  The patient can get out of bed with assist to chair if     she so desires. 3. Pain management.  Norco, OxyIR and morphine. 4. Hemodynamics.  Blood pressure is stable for now; however, we will     hold her HCTZ in the perioperative period. 5. Medical issues:  Hypertension.  Please see #4.  Hyperlipidemia,     resume home medications. 6. Deep venous thrombosis and pulmonary embolism prophylaxis.  SCDs     for now.  Lovenox postop x21 days. 7. Metabolic bone disease.  We will check some basic lab markers     including vitamin D.  The patient will need a DEXA scan as an     outpatient.  I am concerned for osteoporosis given the mechanism of     her fall.  Could consider Forteo therapy if any early phase given     her acute fracture if she does have osteoporosis. 8. Activity:  Bedrest for today, but the patient can try to get out of     bed to the chair if she wishes. 9. Fluids, electrolytes, nutrition/gastrointestinal prophylaxis/Foley     in lines.  Diet as tolerated.  N.p.o. after midnight, and Protonix.     The  patient's potassium is little low.  We will discontinue her     dextrose IV fluids and give normal saline with 20 of potassium. 10.Disposition:  OR tomorrow for open reduction and internal     fixation/intramedullary nail on the right tibia.  I would     anticipate discharge home over the weekend.  I do think that the     patient will mobilize pretty quickly as she seems to be a fairly     active individual and quite motivated.  We will also check an x-ray     of her right hand and we will likely refer to hand specialist     regarding what was probably a trigger finger.     Mearl Latin, PA-C   ______________________________ Doralee Albino. Carola Frost, M.D.    KWP/MEDQ  D:  01/28/2015  T:  01/29/2015  Job:  [id number]

## 2015-01-29 NOTE — Anesthesia Postprocedure Evaluation (Signed)
Anesthesia Post Note  Patient: [name]  Procedure(s) Performed: Procedure(s) (LRB): OPEN REDUCTION INTERNAL FIXATION (ORIF) RIGHT TIBIA  AND FIBULA FRACTURES (Right)  Patient location during evaluation: PACU Anesthesia Type: General Level of consciousness: awake and alert Pain management: pain level controlled Vital Signs Assessment: post-procedure vital signs reviewed and stable Respiratory status: spontaneous breathing, nonlabored ventilation and respiratory function stable Cardiovascular status: blood pressure returned to baseline and stable Postop Assessment: no signs of nausea or vomiting Anesthetic complications: no    Last Vitals:  Filed Vitals:   01/29/15 1900 01/29/15 1930  BP: 144/77   Pulse: 66 86  Temp:    Resp: 13 14    Last Pain:  Filed Vitals:   01/29/15 1933  PainSc: Asleep                 Jonerik Sliker A

## 2015-01-29 NOTE — Anesthesia Procedure Notes (Signed)
Procedure Name: Intubation Date/Time: 01/29/2015 2:26 PM Performed by: Little IshikawaMERCER, Elie Gragert L Pre-anesthesia Checklist: Patient identified, Emergency Drugs available, Suction available, Patient being monitored and Timeout performed Patient Re-evaluated:Patient Re-evaluated prior to inductionOxygen Delivery Method: Circle system utilized Preoxygenation: Pre-oxygenation with 100% oxygen Intubation Type: IV induction Ventilation: Mask ventilation without difficulty Laryngoscope Size: Mac and 3 Grade View: Grade II Tube type: Oral Tube size: 7.0 mm Number of attempts: 1 Airway Equipment and Method: Stylet Placement Confirmation: ETT inserted through vocal cords under direct vision,  positive ETCO2 and breath sounds checked- equal and bilateral Secured at: 21 cm Tube secured with: Tape Dental Injury: Teeth and Oropharynx as per pre-operative assessment

## 2015-01-29 NOTE — Anesthesia Preprocedure Evaluation (Addendum)
Anesthesia Evaluation  Patient identified by MRN, date of birth, ID band Patient awake    Reviewed: Allergy & Precautions, NPO status , Patient's Chart, lab work & pertinent test results  Airway Mallampati: II   Neck ROM: full    Dental  (+) Dental Advisory Given   Pulmonary neg pulmonary ROS,    breath sounds clear to auscultation       Cardiovascular hypertension,  Rhythm:regular Rate:Normal     Neuro/Psych    GI/Hepatic   Endo/Other    Renal/GU      Musculoskeletal   Abdominal   Peds  Hematology   Anesthesia Other Findings   Reproductive/Obstetrics                            Anesthesia Physical Anesthesia Plan  ASA: II  Anesthesia Plan: General   Post-op Pain Management:    Induction: Intravenous  Airway Management Planned: Oral ETT  Additional Equipment:   Intra-op Plan:   Post-operative Plan: Extubation in OR  Informed Consent: I have reviewed the patients History and Physical, chart, labs and discussed the procedure including the risks, benefits and alternatives for the proposed anesthesia with the patient or authorized representative who has indicated his/her understanding and acceptance.     Plan Discussed with: CRNA, Anesthesiologist and Surgeon  Anesthesia Plan Comments:         Anesthesia Quick Evaluation

## 2015-01-29 NOTE — Progress Notes (Signed)
Orthopedic Tech Progress Note Patient Details:  [name] 11/28/1945 [id number]  Ortho Devices Type of Ortho Device: Ace wrap, Post splint, Post (short) splint, Stirrup splint, Post (short leg) splint Splint Material: Plaster Ortho Device/Splint Location: applied ohf to bed Ortho Device/Splint Interventions: Ordered, Application   Jennye MoccasinHughes, Jerris Keltz Craig 01/29/2015, 11:02 PM

## 2015-01-29 NOTE — Transfer of Care (Signed)
Immediate Anesthesia Transfer of Care Note  Patient: [name]  Procedure(s) Performed: Procedure(s): OPEN REDUCTION INTERNAL FIXATION (ORIF) RIGHT TIBIA  AND FIBULA FRACTURES (Right)  Patient Location: PACU  Anesthesia Type:General  Level of Consciousness: sedated and responds to stimulation  Airway & Oxygen Therapy: Patient Spontanous Breathing and Patient connected to nasal cannula oxygen  Post-op Assessment: Report given to RN and Post -op Vital signs reviewed and stable  Post vital signs: stable  Last Vitals:  Filed Vitals:   01/29/15 0555 01/29/15 1724  BP: 132/63 135/76  Pulse: 57 69  Temp: 36.6 C 36.8 C  Resp: 17 12    Complications: No apparent anesthesia complications

## 2015-01-30 ENCOUNTER — Encounter (HOSPITAL_COMMUNITY): Payer: Self-pay | Admitting: Orthopedic Surgery

## 2015-01-30 DIAGNOSIS — E785 Hyperlipidemia, unspecified: Secondary | ICD-10-CM | POA: Diagnosis present

## 2015-01-30 DIAGNOSIS — E782 Mixed hyperlipidemia: Secondary | ICD-10-CM | POA: Diagnosis present

## 2015-01-30 LAB — PTH, INTACT AND CALCIUM
CALCIUM TOTAL (PTH): 8.9 mg/dL (ref 8.7–10.3)
PTH: 45 pg/mL (ref 15–65)

## 2015-01-30 LAB — CBC
HEMATOCRIT: 38.3 % (ref 36.0–46.0)
Hemoglobin: 13.3 g/dL (ref 12.0–15.0)
MCH: 32 pg (ref 26.0–34.0)
MCHC: 34.7 g/dL (ref 30.0–36.0)
MCV: 92.3 fL (ref 78.0–100.0)
PLATELETS: 191 10*3/uL (ref 150–400)
RBC: 4.15 MIL/uL (ref 3.87–5.11)
RDW: 12.4 % (ref 11.5–15.5)
WBC: 6.7 10*3/uL (ref 4.0–10.5)

## 2015-01-30 LAB — BASIC METABOLIC PANEL
Anion gap: 8 (ref 5–15)
BUN: 8 mg/dL (ref 6–20)
CHLORIDE: 101 mmol/L (ref 101–111)
CO2: 29 mmol/L (ref 22–32)
CREATININE: 0.68 mg/dL (ref 0.44–1.00)
Calcium: 8.5 mg/dL — ABNORMAL LOW (ref 8.9–10.3)
GFR calc Af Amer: >60 mL/min (ref >60)
GFR calc non Af Amer: >60 mL/min (ref >60)
GLUCOSE: 116 mg/dL — AB (ref 65–99)
POTASSIUM: 3.9 mmol/L (ref 3.5–5.1)
SODIUM: 138 mmol/L (ref 135–145)

## 2015-01-30 LAB — VITAMIN D 25 HYDROXY (VIT D DEFICIENCY, FRACTURES): Vit D, 25-Hydroxy: 19.9 ng/mL — ABNORMAL LOW (ref 30.0–100.0)

## 2015-01-30 MED ORDER — HYDROCODONE-ACETAMINOPHEN 5-325 MG PO TABS: 1.0000 | ORAL_TABLET | Freq: Four times a day (QID) | ORAL | Status: DC | PRN

## 2015-01-30 MED ORDER — VITAMIN D 1000 UNITS PO TABS
1000.0000 [IU] | ORAL_TABLET | Freq: Two times a day (BID) | ORAL | Status: DC
  Administered 2015-01-30: 1000 [IU] via ORAL
  Filled 2015-01-30: qty 1

## 2015-01-30 MED ORDER — VITAMIN D (ERGOCALCIFEROL) 1.25 MG (50000 UNIT) PO CAPS
50000.0000 [IU] | ORAL_CAPSULE | ORAL | Status: DC
  Administered 2015-01-30: 50000 [IU] via ORAL
  Filled 2015-01-30: qty 1

## 2015-01-30 MED ORDER — DOCUSATE SODIUM 100 MG PO CAPS: 100.0000 mg | ORAL_CAPSULE | Freq: Two times a day (BID) | ORAL | Status: DC

## 2015-01-30 MED ORDER — VITAMIN C 500 MG PO TABS
500.0000 mg | ORAL_TABLET | Freq: Every day | ORAL | Status: DC
  Administered 2015-01-30: 500 mg via ORAL
  Filled 2015-01-30: qty 1

## 2015-01-30 MED ORDER — ENOXAPARIN SODIUM 40 MG/0.4ML ~~LOC~~ SOLN: 40.0000 mg | SUBCUTANEOUS | Status: DC

## 2015-01-30 MED ORDER — VITAMIN D (ERGOCALCIFEROL) 1.25 MG (50000 UNIT) PO CAPS: 50000.0000 [IU] | ORAL_CAPSULE | ORAL | Status: DC

## 2015-01-30 MED ORDER — ASCORBIC ACID 500 MG PO TABS: 500.0000 mg | ORAL_TABLET | Freq: Every day | ORAL | Status: DC

## 2015-01-30 MED ORDER — OXYCODONE HCL 5 MG PO TABS: 5.0000 mg | ORAL_TABLET | Freq: Four times a day (QID) | ORAL | Status: DC | PRN

## 2015-01-30 MED ORDER — CHOLECALCIFEROL 25 MCG (1000 UT) PO TABS: 1000.0000 [IU] | ORAL_TABLET | Freq: Two times a day (BID) | ORAL | Status: DC

## 2015-01-30 MED ORDER — METHOCARBAMOL 500 MG PO TABS: 500.0000 mg | ORAL_TABLET | Freq: Four times a day (QID) | ORAL | Status: DC | PRN

## 2015-01-30 NOTE — Evaluation (Signed)
Physical Therapy Evaluation/Discharge Patient Details Name: [name] MRN: [id number] DOB: 02/16/1945 Today's Date: 01/30/2015   History of Present Illness  Pt admitted after a fall while raking leaves with Right ankle fx s/p ORIF. PMHx: HTN  Clinical Impression  Pt was pleasant and provided good effort throughout session. She presents with decreased balance and safety with transfers. Education provided on precautions, HEP, stair navigation, use of DME, and POC. Pt has met all criteria and education provided to be safe at home. Once medically cleared, D/C to home is most appropriate given pt's current level of function and caregiver support.     Follow Up Recommendations No PT follow up    Equipment Recommendations  Rolling walker with 5" wheels    Recommendations for Other Services       Precautions / Restrictions Restrictions Weight Bearing Restrictions: Yes RLE Weight Bearing: Non weight bearing      Mobility  Bed Mobility Overal bed mobility: Modified Independent             General bed mobility comments: HOB elevated  Transfers Overall transfer level: Needs assistance   Transfers: Sit to/from Stand Sit to Stand: Min guard         General transfer comment: Min guard for stability. VC for hand placement and sequencing. Sit to stand x3, pt needed education on backing up completely to chair and to use hands to guide into sitting.   Ambulation/Gait Ambulation/Gait assistance: Min guard Ambulation Distance (Feet): 120 Feet Assistive device: Rolling walker (2 wheeled) Gait Pattern/deviations: Step-to pattern   Gait velocity interpretation: Below normal speed for age/gender General Gait Details: Pt demonstrated good UE control and sequencing with hopping in RW. Pt tried knee walker but felt uncomfortable, preferred RW.   Stairs Stairs: Yes Stairs assistance: Min guard Stair Management: No rails;Backwards;With walker Number of Stairs: 4 General stair  comments: 2 trials. Min guard on both trials for stability. VC to push through arms and keep leg out. Second trial pt instructed husband how to perform stairs  Wheelchair Mobility    Modified Rankin (Stroke Patients Only)       Balance Overall balance assessment: Needs assistance Sitting-balance support: No upper extremity supported;Feet unsupported Sitting balance-Leahy Scale: Normal     Standing balance support: Single extremity supported Standing balance-Leahy Scale: Fair Standing balance comment: Able to stand without support briefly but preferred hand held support.                             Pertinent Vitals/Pain Pain Assessment: 0-10 Pain Score: 2  Pain Location: R LE Pain Descriptors / Indicators: Aching Pain Intervention(s): Monitored during session;Repositioned    Home Living Family/patient expects to be discharged to:: Private residence Living Arrangements: Spouse/significant other Available Help at Discharge: Family Type of Home: House Home Access: Stairs to enter Entrance Stairs-Rails: Psychiatric nurse of Steps: 4 Home Layout: Two level Home Equipment: Environmental consultant - 4 wheels;Bedside commode;Shower seat - built in      Prior Function Level of Independence: Independent               Journalist, newspaper        Extremity/Trunk Assessment   Upper Extremity Assessment: Defer to OT evaluation           Lower Extremity Assessment: Overall WFL for tasks assessed      Cervical / Trunk Assessment: Normal  Communication   Communication: No difficulties  Cognition Arousal/Alertness: Awake/alert  Behavior During Therapy: WFL for tasks assessed/performed Overall Cognitive Status: Within Functional Limits for tasks assessed                      General Comments      Exercises General Exercises - Lower Extremity Quad Sets: AROM;10 reps;Right;Seated Long Arc Quad: AROM;15 reps;Right;Seated Hip ABduction/ADduction:  AROM;Both;15 reps;Seated Hip Flexion/Marching: AROM;15 reps;Both;Seated      Assessment/Plan    PT Assessment Patent does not need any further PT services  PT Diagnosis Difficulty walking;Acute pain   PT Problem List    PT Treatment Interventions     PT Goals (Current goals can be found in the Care Plan section) Acute Rehab PT Goals PT Goal Formulation: All assessment and education complete, DC therapy    Frequency     Barriers to discharge        Co-evaluation               End of Session Equipment Utilized During Treatment: Gait belt Activity Tolerance: Patient tolerated treatment well Patient left: in chair;with call bell/phone within reach;with family/visitor present Nurse Communication: Mobility status;Precautions;Weight bearing status         Time: 4360-6770 PT Time Calculation (min) (ACUTE ONLY): 28 min   Charges:   PT Evaluation $Initial PT Evaluation Tier I: 1 Procedure PT Treatments $Gait Training: 8-22 mins   PT G CodesHaynes Bast 02/03/15, 12:33 PM Haynes Bast, SPT 02/03/2015 12:36 PM

## 2015-01-30 NOTE — Progress Notes (Signed)
Orthopaedic Trauma Service (OTS)   Subjective: Patient reports pain as mild.  Very pleased with nursing and service.  Husband at bedside. Both comfortable with plan for discharge.  Objective: Temp:  [97 F (36.1 C)-98.2 F (36.8 C)] 98 F (36.7 C) (12/16 0624) Pulse Rate:  [59-86] 67 (12/16 0624) Resp:  [12-18] 18 (12/16 0624) BP: (112-145)/(59-79) 117/68 mmHg (12/16 0624) SpO2:  [97 %-100 %] 98 % (12/16 16100624)  Physical Exam RLE Dressing intact, clean, dry  Edema/ swelling controlled  Sens: DPN, SPN, TN intact  Motor: EHL, FHL, and lessor toe ext and flex all intact grossly  Brisk cap refill, warm to touch  Assessment/Plan: 1 Day Post-Op Procedure(s) (LRB): OPEN REDUCTION INTERNAL FIXATION (ORIF) RIGHT TIBIA  AND FIBULA FRACTURES (Right) Doing fantastic 1. D/c to home 2. NWB on RLE 3. Lovenox 4. Vit D deficient-- vit D2 and D3 ordered 5. F/u office in two weeks  Myrene GalasMichael Brailyn Killion, MD [facility] [phone] (p)

## 2015-01-30 NOTE — Care Management Important Message (Signed)
Important Message  Patient Details  Name: [name] MRN: [id number] Date of Birth: 12/07/1945   Medicare Important Message Given:  Yes    Clifton Kovacic P Lyman Balingit 01/30/2015, 2:41 PM

## 2015-01-30 NOTE — Care Management (Signed)
Rolling walker ordered through [facility]. Ronny FlurryHeather Oland Arquette RN BSN [phone]

## 2015-01-30 NOTE — Discharge Instructions (Signed)
Orthopaedic Trauma Service Discharge Instructions   General Discharge Instructions  WEIGHT BEARING STATUS: Nonweightbearing right leg  RANGE OF MOTION/ACTIVITY: knee range of motion as tolerated. Toe motion as tolerated. Do not remove splint  Wound Care: do not remove splint. Keep splint clean and dry   PAIN MEDICATION USE AND EXPECTATIONS  You have likely been given narcotic medications to help control your pain.  After a traumatic event that results in an fracture (broken bone) with or without surgery, it is ok to use narcotic pain medications to help control one's pain.  We understand that everyone responds to pain differently and each individual patient will be evaluated on a regular basis for the continued need for narcotic medications. Ideally, narcotic medication use should last no more than 6-8 weeks (coinciding with fracture healing).   As a patient it is your responsibility as well to monitor narcotic medication use and report the amount and frequency you use these medications when you come to your office visit.   We would also advise that if you are using narcotic medications, you should take a dose prior to therapy to maximize you participation.  IF YOU ARE ON NARCOTIC MEDICATIONS IT IS NOT PERMISSIBLE TO OPERATE A MOTOR VEHICLE (MOTORCYCLE/CAR/TRUCK/MOPED) OR HEAVY MACHINERY DO NOT MIX NARCOTICS WITH OTHER CNS (CENTRAL NERVOUS SYSTEM) DEPRESSANTS SUCH AS ALCOHOL  Diet: as you were eating previously.  Can use over the counter stool softeners and bowel preparations, such as Miralax, to help with bowel movements.  Narcotics can be constipating.  Be sure to drink plenty of fluids    STOP SMOKING OR USING NICOTINE PRODUCTS!!!!  As discussed nicotine severely impairs your body's ability to heal surgical and traumatic wounds but also impairs bone healing.  Wounds and bone heal by forming microscopic blood vessels (angiogenesis) and nicotine is a vasoconstrictor (essentially, shrinks  blood vessels).  Therefore, if vasoconstriction occurs to these microscopic blood vessels they essentially disappear and are unable to deliver necessary nutrients to the healing tissue.  This is one modifiable factor that you can do to dramatically increase your chances of healing your injury.    (This means no smoking, no nicotine gum, patches, etc)  DO NOT USE NONSTEROIDAL ANTI-INFLAMMATORY DRUGS (NSAID'S)  Using products such as Advil (ibuprofen), Aleve (naproxen), Motrin (ibuprofen) for additional pain control during fracture healing can delay and/or prevent the healing response.  If you would like to take over the counter (OTC) medication, Tylenol (acetaminophen) is ok.  However, some narcotic medications that are given for pain control contain acetaminophen as well. Therefore, you should not exceed more than 4000 mg of tylenol in a day if you do not have liver disease.  Also note that there are may OTC medicines, such as cold medicines and allergy medicines that my contain tylenol as well.  If you have any questions about medications and/or interactions please ask your doctor/PA or your pharmacist.      ICE AND ELEVATE INJURED/OPERATIVE EXTREMITY  Using ice and elevating the injured extremity above your heart can help with swelling and pain control.  Icing in a pulsatile fashion, such as 20 minutes on and 20 minutes off, can be followed.    Do not place ice directly on skin. Make sure there is a barrier between to skin and the ice pack.    Using frozen items such as frozen peas works well as the conform nicely to the are that needs to be iced.  USE AN ACE WRAP OR TED HOSE FOR  SWELLING CONTROL  In addition to icing and elevation, Ace wraps or TED hose are used to help limit and resolve swelling.  It is recommended to use Ace wraps or TED hose until you are informed to stop.    When using Ace Wraps start the wrapping distally (farthest away from the body) and wrap proximally (closer to the  body)   Example: If you had surgery on your leg or thing and you do not have a splint on, start the ace wrap at the toes and work your way up to the thigh        If you had surgery on your upper extremity and do not have a splint on, start the ace wrap at your fingers and work your way up to the upper arm  IF YOU ARE IN A SPLINT OR CAST DO NOT REMOVE IT FOR ANY REASON   If your splint gets wet for any reason please contact the office immediately. You may shower in your splint or cast as long as you keep it dry.  This can be done by wrapping in a cast cover or garbage back (or similar)  Do Not stick any thing down your splint or cast such as pencils, money, or hangers to try and scratch yourself with.  If you feel itchy take benadryl as prescribed on the bottle for itching  IF YOU ARE IN A CAM BOOT (BLACK BOOT)  You may remove boot periodically. Perform daily dressing changes as noted below.  Wash the liner of the boot regularly and wear a sock when wearing the boot. It is recommended that you sleep in the boot until told otherwise  CALL THE OFFICE WITH ANY QUESTIONS OR CONCERTS: [phone]

## 2015-01-30 NOTE — Progress Notes (Signed)
Pt discharged to home.  Discharge instructions explained to pt.  IV removed.  Pt taken off floor via volunteer services.

## 2015-01-30 NOTE — Care Management Note (Signed)
Case Management Note  Patient Details  Name: Bolivar HawMartha A Mcnulty MRN: [id number] Date of Birth: 09/23/1945  Subjective/Objective:                    Action/Plan:  Patient has a rolling walker with seat at home already , she does not have a bedside commode. Will await PT/OT evals  Expected Discharge Date:   (UNKNOWN)               Expected Discharge Plan:  Home w Hospice Care  In-House Referral:     Discharge planning Services  CM Consult  Post Acute Care Choice:  Home Health Choice offered to:  Patient  DME Arranged:    DME Agency:     HH Arranged:  PT HH Agency:  [facility]  Status of Service:  In process, will continue to follow  Medicare Important Message Given:    Date Medicare IM Given:    Medicare IM give by:    Date Additional Medicare IM Given:    Additional Medicare Important Message give by:     If discussed at Long Length of Stay Meetings, dates discussed:    Additional Comments:  Kingsley PlanWile, Nuria Phebus Marie, RN 01/30/2015, 8:15 AM

## 2015-01-30 NOTE — Evaluation (Signed)
Occupational Therapy Evaluation Patient Details Name: Alyssa HawMartha A Bielinski MRN: [id number] DOB: 11/02/1945 Today's Date: 01/30/2015    History of Present Illness Pt admitted after a fall while raking leaves with Right ankle fx s/p ORIF. PMHx: HTN   Clinical Impression   Patient evaluated by Occupational Therapy with no further acute OT needs identified. All education has been completed and the patient has no further questions. See below for any follow-up Occupational Therapy or equipment needs. OT to sign off. Thank you for referral.      Follow Up Recommendations  No OT follow up    Equipment Recommendations  None recommended by OT    Recommendations for Other Services       Precautions / Restrictions Precautions Precautions: None Restrictions Weight Bearing Restrictions: Yes RLE Weight Bearing: Non weight bearing      Mobility Bed Mobility Overal bed mobility: Modified Independent             General bed mobility comments: in chair on arrival  Transfers Overall transfer level: Needs assistance   Transfers: Sit to/from Stand Sit to Stand: Min guard         General transfer comment: observed with PT ( see their note)    Balance Overall balance assessment: Needs assistance Sitting-balance support: No upper extremity supported;Feet unsupported Sitting balance-Leahy Scale: Normal     Standing balance support: Single extremity supported Standing balance-Leahy Scale: Fair Standing balance comment: Able to stand without support briefly but preferred hand held support.                            ADL Overall ADL's : Needs assistance/impaired Eating/Feeding: Modified independent   Grooming: Wash/dry hands;Wash/dry face;Oral care;Modified independent;Sitting   Upper Body Bathing: Modified independent   Lower Body Bathing: Minimal assistance;Sit to/from stand                         General ADL Comments: Pt educated on wrapping R LE with  bags for showering. Pt also educated on bathing methods: sponge bath, placing R knee on chair leaning over sink to wash hair, leaning back in a recliner to wash hair into trash can, washing hair in supine, shower transfer with 3n1, and use of baby power between showers to decr oil to hair. spouse present for all education. Pt's spouse with questions regarding MD education on getting leg higher than heart. OT providing education with room couch on head placed on seat and leg up on pillows. Spouse and pt verbalized a clear understanding now. pt with questions about bed positioning so education on pillows and leg lift sheet used at night ot help pt reposition. pt educated on placement of 3n1 ( bed toilet and shower) pt has access to x2 from sister. Pt awaiting sister arrival with clothes. Pt educated on dressing operative side first and then L LE. pt educated on pulling to hips then standing and pulling both up at same time. Pt and spouse educated on car positioning for 30 minute ride home. Spouse asking about movement in home. Pt and spouse educated that all mobility should be short distance at this time inside the home but throught out the day.      Vision     Perception     Praxis      Pertinent Vitals/Pain Pain Assessment: 0-10 Pain Score: 2  Pain Location: R LE Pain Descriptors / Indicators: Aching Pain Intervention(s): Monitored  during session;Premedicated before session     Hand Dominance Right   Extremity/Trunk Assessment Upper Extremity Assessment Upper Extremity Assessment: Overall WFL for tasks assessed   Lower Extremity Assessment Lower Extremity Assessment: Defer to PT evaluation   Cervical / Trunk Assessment Cervical / Trunk Assessment: Normal   Communication Communication Communication: No difficulties   Cognition Arousal/Alertness: Awake/alert Behavior During Therapy: WFL for tasks assessed/performed Overall Cognitive Status: Within Functional Limits for tasks  assessed                     General Comments       Exercises       Shoulder Instructions      Home Living Family/patient expects to be discharged to:: Private residence Living Arrangements: Spouse/significant other Available Help at Discharge: Family Type of Home: House Home Access: Stairs to enter Secretary/administrator of Steps: 4 Entrance Stairs-Rails: Right;Left Home Layout: Two level Alternate Level Stairs-Number of Steps: flight Alternate Level Stairs-Rails: Right Bathroom Shower/Tub: Producer, television/film/video: Standard     Home Equipment: Environmental consultant - 4 wheels;Bedside commode;Shower seat - built in          Prior Functioning/Environment Level of Independence: Independent             OT Diagnosis: Acute pain   OT Problem List:     OT Treatment/Interventions:      OT Goals(Current goals can be found in the care plan section) Acute Rehab OT Goals Patient Stated Goal: to go home today OT Goal Formulation: With patient/family  OT Frequency:     Barriers to D/C:            Co-evaluation              End of Session Nurse Communication: Mobility status;Precautions  Activity Tolerance: Patient tolerated treatment well Patient left: in chair;with call bell/phone within reach;with family/visitor present   Time: 1220-1246 OT Time Calculation (min): 26 min Charges:  OT General Charges $OT Visit: 1 Procedure OT Evaluation $Initial OT Evaluation Tier I: 1 Procedure OT Treatments $Self Care/Home Management : 8-22 mins G-Codes:    Harolyn Rutherford 02/21/15, 2:15 PM   Mateo Flow   OTR/L Pager: [phone] Office: [phone] .

## 2015-02-17 NOTE — Op Note (Signed)
NAME:  [name], [name]                  ACCOUNT NO.:  [id number]  MEDICAL RECORD NO.:  [id number]  LOCATION:  [id number]                        FACILITY:  [facility]  PHYSICIAN:  Astrid Divine. Marcelino Scot, M.D. DATE OF BIRTH:  08-30-1945  DATE OF PROCEDURE:  01/29/2015 DATE OF DISCHARGE:  01/30/2015                              OPERATIVE REPORT   PREOPERATIVE DIAGNOSIS:  Right tibial pilon.  POSTOPERATIVE DIAGNOSES: 1. Right tibial pilon. 2. Ruptured syndesmosis.  PROCEDURES: 1. Open reduction and internal fixation of tibial pilon, tibia only,     right. 2. Open reduction and internal fixation of right ankle syndesmosis. 3. Stress fluoroscopy, right ankle syndesmosis.  SURGEON:  Astrid Divine. Marcelino Scot, MD  ASSISTING:  Ainsley Spinner, PA-C  ANESTHESIA:  General.  COMPLICATIONS:  None.  TOURNIQUET:  None.  DISPOSITION:  To PACU.  CONDITION:  Stable.  BRIEF SUMMARY OF INDICATIONS FOR PROCEDURE:  [name] is a very pleasant 70 year old female, who was blowing leaves when she had an all cord fall with severe twisting injury to the right ankle, treated with immediate reduction splinting and now presents for definitive internal fixation.  I discussed with her and her husband the risks and benefits of surgery including the possibility of infection, nerve injury, vessel injury, DVT, PE, heart attack, stroke, loss of motion, symptomatic hardware, need for further surgery among others.  The patient acknowledged these risks and did wish to proceed.  BRIEF SUMMARY OF PROCEDURE:  Ms. Toppins was given preop antibiotics, taken to the operating room and general anesthesia was induced.  A right lower extremity was prepped and draped in usual sterile fashion.  Skin did allow for sufficient instrumentation.  I began with the medial side where a small distal incision was made and extraperiosteal dissection performed to greater medial plane to slide up the plate.  This was followed by closed reduction maneuver by my  assistant, accessed traction and derotation and then secured the plate distally with standard fixation and then several lock fixation, began producing traction after lagging in the articular segments with the standard fixation and after securing the entire articular block, it was pulled distally, I was able to place standard screws proximally such that a bridge technique was used without directly opening the metaphyseal fracture.  I then turned my attention distally where a stress maneuver was performed, syndesmosis and obvious instability and gapping was present. Consequently made another lateral incision and secured a plate distally placing 2 anteriorly oriented screws across the syndesmosis after reducing it and checking carefully on multiple views with a tenaculum. Final images showed appropriate reduction of the syndesmosis, appropriate hardware placement.  Length and trajectory met.  Again, Ainsley Spinner PA-C assisted me throughout this included with wound closure as well as this was performed with Vicryl and nylon.  Sterile gently compressive dressing was applied and a posterior stirrup and knee splint.  Patient was taken to PACU in stable condition.  PROGNOSIS:  Ms. Bath will be nonweightbearing for the next 8 weeks with graduated weightbearing thereafter.  Anticipate return to the office in 2 weeks for removal of her sutures, splint transitioned into a CAM boot with unrestricted range of motion at that time.  Astrid Divine. Marcelino Scot, M.D.     MHH/MEDQ  D:  02/17/2015  T:  02/17/2015  Job:  [id number]

## 2015-03-06 ENCOUNTER — Encounter (HOSPITAL_COMMUNITY): Payer: Self-pay | Admitting: Orthopedic Surgery

## 2015-03-06 DIAGNOSIS — S93431A Sprain of tibiofibular ligament of right ankle, initial encounter: Secondary | ICD-10-CM

## 2015-03-06 DIAGNOSIS — E559 Vitamin D deficiency, unspecified: Secondary | ICD-10-CM

## 2015-03-06 HISTORY — DX: Vitamin D deficiency, unspecified: E55.9

## 2015-03-06 HISTORY — DX: Sprain of tibiofibular ligament of right ankle, initial encounter: S93.431A

## 2015-03-06 NOTE — Discharge Summary (Signed)
Orthopaedic Trauma Service (OTS)  Patient ID: [name] MRN: [id number] DOB/AGE: Feb 01, 1946 70 y.o.  Admit date: 01/27/2015 Discharge date: 01/30/2015  Admission Diagnoses: Right distal tibia fracture Right tibial shaft fracture Right fibula fracture Hypertension Hypercholesterolemia  Discharge Diagnoses:  Principal Problem:   Fracture, tibia, with fibula Active Problems:   Fracture of tibia and fibula   Hypertension   High cholesterol   Vitamin D deficiency   Procedures Performed: 01/29/2015- Dr. Marcelino Scot 1. Open reduction and internal fixation of tibial pilon, tibia only,     right. 2. Open reduction and internal fixation of right ankle syndesmosis. 3. Stress fluoroscopy, right ankle syndesmosis   Discharged Condition: good  Hospital Course:   70 year old female sustained a ground-level fall while cleaning some leaves. She twisted her right ankle and sustained a significant injury to her right distal tibia and fibula. Patient was initially admitted to Dr. Tamera Punt service on 01/27/2015 with orthopedics but due to the complexity of the injury patient was transferred to the care of Dr. Legrand Como handy and the orthopedic trauma service. Patient was seen in consultation on 01/28/2015 and then taken to the OR following day. Patient was transferred to [facility] for definitive management. She was taken to the operating room on 01/29/2015 with the procedures noted above were performed. Patient tolerated the procedure very well. Postoperatively she was transferred to the PACU for recovery from anesthesia and transferred back to the orthopedic floor for continued observation, pain control and therapies. On postoperative day #1 patient was doing fantastic and did not have any issues. Her pain was well-controlled she was able to work with physical therapy and was able to pass all requirements with therapy. She requested to be discharged on postoperative day #1. At the time of discharge  patient was tolerating regular diet and pain was controlled with oral pain medication  Due to the mechanism of her injury we did evaluate her vitamin D level. She was found to be vitamin D deficient. She was started on supplementation to discharge. The remainder of her metabolic bone markers were within acceptable parameters. Patient will need a bone density scan as an outpatient. She'll also likely need to be started on additional pharmacologic treatment for osteoporosis once her fracture is healed.  Consults: None  Significant Diagnostic Studies: labs:  Results for Dimarco, ANARIA KRONER (MRN [id number]) as of 03/06/2015 10:06  Ref. Range 01/29/2015 09:00  Vit D, 25-Hydroxy Latest Ref Range: 30.0-100.0 ng/mL 19.9 (L)   Results for Garfield, Tunya A (MRN [id number]) as of 03/06/2015 10:06  Ref. Range 01/29/2015 09:00  PTH Latest Ref Range: 15-65 pg/mL 45  TSH Latest Ref Range: 0.350-4.500 uIU/mL 3.813  Results for Shubert, Dyanara A (MRN [id number]) as of 03/06/2015 10:06  Ref. Range 01/29/2015 09:00  Sodium Latest Ref Range: 135-145 mmol/L 141  Potassium Latest Ref Range: 3.5-5.1 mmol/L 3.5  Chloride Latest Ref Range: 101-111 mmol/L 105  CO2 Latest Ref Range: 22-32 mmol/L 27  BUN Latest Ref Range: 6-20 mg/dL 10  Creatinine Latest Ref Range: 0.44-1.00 mg/dL 0.77  Calcium Latest Ref Range: 8.9-10.3 mg/dL 9.1  EGFR (Non-African Amer.) Latest Ref Range: >60 mL/min >60  EGFR (African American) Latest Ref Range: >60 mL/min >60  Glucose Latest Ref Range: 65-99 mg/dL 106 (H)  Anion gap Latest Ref Range: 5-15  9  Calcium, Total (PTH) Latest Ref Range: 8.7-10.3 mg/dL 8.9  Phosphorus Latest Ref Range: 2.5-4.6 mg/dL 3.2  Magnesium Latest Ref Range: 1.7-2.4 mg/dL 1.9  Alkaline Phosphatase Latest Ref Range: 38-126  U/L 57  Albumin Latest Ref Range: 3.5-5.0 g/dL 3.6  AST Latest Ref Range: 15-41 U/L 29  ALT Latest Ref Range: 14-54 U/L 23  Total Protein Latest Ref Range: 6.5-8.1 g/dL 6.3 (L)  Total Bilirubin Latest Ref  Range: 0.3-1.2 mg/dL 1.0  PREALBUMIN Latest Ref Range: 18-38 mg/dL 20.2  Transferrin Latest Ref Range: 192-382 mg/dL 236   Treatments: IV hydration, antibiotics: Ancef, analgesia: acetaminophen, Dilaudid, OxyIR and Percocet, anticoagulation: LMW heparin, therapies: PT, OT and RN and surgery: As above  Discharge Exam:       Orthopaedic Trauma Service (OTS)   Subjective: Patient reports pain as mild.  Very pleased with nursing and service.  Husband at bedside. Both comfortable with plan for discharge.  Objective: Temp:  [97 F (36.1 C)-98.2 F (36.8 C)] 98 F (36.7 C) (12/16 0624) Pulse Rate:  [59-86] 67 (12/16 0624) Resp:  [12-18] 18 (12/16 0624) BP: (112-145)/(59-79) 117/68 mmHg (12/16 0624) SpO2:  [97 %-100 %] 98 % (12/16 1027)  Physical Exam RLE     Dressing intact, clean, dry             Edema/ swelling controlled             Sens: DPN, SPN, TN intact             Motor: EHL, FHL, and lessor toe ext and flex all intact grossly             Brisk cap refill, warm to touch  Assessment/Plan: 1 Day Post-Op Procedure(s) (LRB): OPEN REDUCTION INTERNAL FIXATION (ORIF) RIGHT TIBIA  AND FIBULA FRACTURES (Right) Doing fantastic 1. D/c to home 2. NWB on RLE 3. Lovenox 4. Vit D deficient-- vit D2 and D3 ordered 5. F/u office in two weeks  Altamese [place], MD [facility], [facility] [phone] (p)     Disposition: 01-Home or Self Care      Discharge Instructions    Call MD / Call 911    Complete by:  As directed   If you experience chest pain or shortness of breath, CALL 911 and be transported to the hospital emergency room.  If you develope a fever above 101 F, pus (white drainage) or increased drainage or redness at the wound, or calf pain, call your surgeon's office.     Constipation Prevention    Complete by:  As directed   Drink plenty of fluids.  Prune juice may be helpful.  You may use a stool softener, such as Colace (over the counter) 100  mg twice a day.  Use MiraLax (over the counter) for constipation as needed.     Diet - low sodium heart healthy    Complete by:  As directed      Discharge instructions    Complete by:  As directed   Orthopaedic Trauma Service Discharge Instructions   General Discharge Instructions  WEIGHT BEARING STATUS: Nonweightbearing right leg  RANGE OF MOTION/ACTIVITY: knee range of motion as tolerated. Toe motion as tolerated. Do not remove splint  Wound Care: do not remove splint. Keep splint clean and dry   PAIN MEDICATION USE AND EXPECTATIONS  You have likely been given narcotic medications to help control your pain.  After a traumatic event that results in an fracture (broken bone) with or without surgery, it is ok to use narcotic pain medications to help control one's pain.  We understand that everyone responds to pain differently and each individual patient will be evaluated on a regular basis for  the continued need for narcotic medications. Ideally, narcotic medication use should last no more than 6-8 weeks (coinciding with fracture healing).   As a patient it is your responsibility as well to monitor narcotic medication use and report the amount and frequency you use these medications when you come to your office visit.   We would also advise that if you are using narcotic medications, you should take a dose prior to therapy to maximize you participation.  IF YOU ARE ON NARCOTIC MEDICATIONS IT IS NOT PERMISSIBLE TO OPERATE A MOTOR VEHICLE (MOTORCYCLE/CAR/TRUCK/MOPED) OR HEAVY MACHINERY DO NOT MIX NARCOTICS WITH OTHER CNS (CENTRAL NERVOUS SYSTEM) DEPRESSANTS SUCH AS ALCOHOL  Diet: as you were eating previously.  Can use over the counter stool softeners and bowel preparations, such as Miralax, to help with bowel movements.  Narcotics can be constipating.  Be sure to drink plenty of fluids    STOP SMOKING OR USING NICOTINE PRODUCTS!!!!  As discussed nicotine severely impairs your body's  ability to heal surgical and traumatic wounds but also impairs bone healing.  Wounds and bone heal by forming microscopic blood vessels (angiogenesis) and nicotine is a vasoconstrictor (essentially, shrinks blood vessels).  Therefore, if vasoconstriction occurs to these microscopic blood vessels they essentially disappear and are unable to deliver necessary nutrients to the healing tissue.  This is one modifiable factor that you can do to dramatically increase your chances of healing your injury.    (This means no smoking, no nicotine gum, patches, etc)  DO NOT USE NONSTEROIDAL ANTI-INFLAMMATORY DRUGS (NSAID'S)  Using products such as Advil (ibuprofen), Aleve (naproxen), Motrin (ibuprofen) for additional pain control during fracture healing can delay and/or prevent the healing response.  If you would like to take over the counter (OTC) medication, Tylenol (acetaminophen) is ok.  However, some narcotic medications that are given for pain control contain acetaminophen as well. Therefore, you should not exceed more than 4000 mg of tylenol in a day if you do not have liver disease.  Also note that there are may OTC medicines, such as cold medicines and allergy medicines that my contain tylenol as well.  If you have any questions about medications and/or interactions please ask your doctor/PA or your pharmacist.      ICE AND ELEVATE INJURED/OPERATIVE EXTREMITY  Using ice and elevating the injured extremity above your heart can help with swelling and pain control.  Icing in a pulsatile fashion, such as 20 minutes on and 20 minutes off, can be followed.    Do not place ice directly on skin. Make sure there is a barrier between to skin and the ice pack.    Using frozen items such as frozen peas works well as the conform nicely to the are that needs to be iced.  USE AN ACE WRAP OR TED HOSE FOR SWELLING CONTROL  In addition to icing and elevation, Ace wraps or TED hose are used to help limit and resolve swelling.   It is recommended to use Ace wraps or TED hose until you are informed to stop.    When using Ace Wraps start the wrapping distally (farthest away from the body) and wrap proximally (closer to the body)   Example: If you had surgery on your leg or thing and you do not have a splint on, start the ace wrap at the toes and work your way up to the thigh        If you had surgery on your upper extremity and do not have  a splint on, start the ace wrap at your fingers and work your way up to the upper arm  IF YOU ARE IN A SPLINT OR CAST DO NOT REMOVE IT FOR ANY REASON   If your splint gets wet for any reason please contact the office immediately. You may shower in your splint or cast as long as you keep it dry.  This can be done by wrapping in a cast cover or garbage back (or similar)  Do Not stick any thing down your splint or cast such as pencils, money, or hangers to try and scratch yourself with.  If you feel itchy take benadryl as prescribed on the bottle for itching  IF YOU ARE IN A CAM BOOT (BLACK BOOT)  You may remove boot periodically. Perform daily dressing changes as noted below.  Wash the liner of the boot regularly and wear a sock when wearing the boot. It is recommended that you sleep in the boot until told otherwise  CALL THE OFFICE WITH ANY QUESTIONS OR CONCERTS: [phone]        Discharge Pin Site Instructions  Dress pins daily with Kerlix roll starting on POD 2. Wrap the Kerlix so that it tamps the skin down around the pin-skin interface to prevent/limit motion of the skin relative to the pin.  (Pin-skin motion is the primary cause of pain and infection related to external fixator pin sites).  Remove any crust or coagulum that may obstruct drainage with a saline moistened gauze or soap and water.  After POD 3, if there is no discernable drainage on the pin site dressing, the interval for change can by increased to every other day.  You may shower with the fixator, cleaning  all pin sites gently with soap and water.  If you have a surgical wound this needs to be completely dry and without drainage before showering.  The extremity can be lifted by the fixator to facilitate wound care and transfers.  Notify the office/Doctor if you experience increasing drainage, redness, or pain from a pin site, or if you notice purulent (thick, snot-like) drainage.  Discharge Wound Care Instructions  Do NOT apply any ointments, solutions or lotions to pin sites or surgical wounds.  These prevent needed drainage and even though solutions like hydrogen peroxide kill bacteria, they also damage cells lining the pin sites that help fight infection.  Applying lotions or ointments can keep the wounds moist and can cause them to breakdown and open up as well. This can increase the risk for infection. When in doubt call the office.  Surgical incisions should be dressed daily.  If any drainage is noted, use one layer of adaptic, then gauze, Kerlix, and an ace wrap.  Once the incision is completely dry and without drainage, it may be left open to air out.  Showering may begin 36-48 hours later.  Cleaning gently with soap and water.  Traumatic wounds should be dressed daily as well.    One layer of adaptic, gauze, Kerlix, then ace wrap.  The adaptic can be discontinued once the draining has ceased    If you have a wet to dry dressing: wet the gauze with saline the squeeze as much saline out so the gauze is moist (not soaking wet), place moistened gauze over wound, then place a dry gauze over the moist one, followed by Kerlix wrap, then ace wrap.     Driving restrictions    Complete by:  As directed   No driving  Increase activity slowly as tolerated    Complete by:  As directed      Non weight bearing    Complete by:  As directed             Medication List    STOP taking these medications        ibuprofen 200 MG tablet  Commonly known as:  ADVIL,MOTRIN      TAKE these  medications        ascorbic acid 500 MG tablet  Commonly known as:  VITAMIN C  Take 1 tablet (500 mg total) by mouth daily.     aspirin 81 MG tablet  Take 81 mg by mouth daily.     Cholecalciferol 1000 units tablet  Take 1 tablet (1,000 Units total) by mouth 2 (two) times daily.     diphenhydrAMINE 25 MG tablet  Commonly known as:  BENADRYL  Take 25 mg by mouth as needed.     docusate sodium 100 MG capsule  Commonly known as:  COLACE  Take 1 capsule (100 mg total) by mouth 2 (two) times daily.     enoxaparin 40 MG/0.4ML injection  Commonly known as:  LOVENOX  Inject 0.4 mLs (40 mg total) into the skin daily.     fexofenadine 180 MG tablet  Commonly known as:  ALLEGRA  Take 180 mg by mouth daily as needed for allergies or rhinitis.     fluticasone 50 MCG/ACT nasal spray  Commonly known as:  FLONASE  Place 2 sprays into both nostrils as needed for allergies or rhinitis.     hydrochlorothiazide 25 MG tablet  Commonly known as:  HYDRODIURIL  Take 25 mg by mouth daily.     HYDROcodone-acetaminophen 5-325 MG tablet  Commonly known as:  NORCO/VICODIN  Take 1-2 tablets by mouth every 6 (six) hours as needed for severe pain.     methocarbamol 500 MG tablet  Commonly known as:  ROBAXIN  Take 1-2 tablets (500-1,000 mg total) by mouth every 6 (six) hours as needed for muscle spasms.     oxyCODONE 5 MG immediate release tablet  Commonly known as:  Oxy IR/ROXICODONE  Take 1-2 tablets (5-10 mg total) by mouth every 6 (six) hours as needed for breakthrough pain (take between norco doses for breakthrough pain if needed).     simvastatin 40 MG tablet  Commonly known as:  ZOCOR  Take 40 mg by mouth daily.     Vitamin D (Ergocalciferol) 50000 units Caps capsule  Commonly known as:  DRISDOL  Take 1 capsule (50,000 Units total) by mouth every 7 (seven) days.       Follow-up Information    Follow up with HANDY,MICHAEL H, MD. Schedule an appointment as soon as possible for a visit  in 2 weeks.   Specialty:  Orthopedic Surgery   Why:  For wound re-check, For suture removal   Contact information:   [place] [address] [phone]        Signed:  Jari Pigg, PA-C [facility] [phone] (P) 03/06/2015, 10:02 AM

## 2015-03-25 ENCOUNTER — Other Ambulatory Visit: Payer: Self-pay | Admitting: Orthopedic Surgery

## 2015-03-26 ENCOUNTER — Other Ambulatory Visit: Payer: Self-pay | Admitting: Orthopedic Surgery

## 2015-03-26 DIAGNOSIS — M8580 Other specified disorders of bone density and structure, unspecified site: Secondary | ICD-10-CM

## 2015-04-16 ENCOUNTER — Inpatient Hospital Stay: Admission: RE | Admit: 2015-04-16 | Payer: Medicare Other | Source: Ambulatory Visit

## 2015-04-21 ENCOUNTER — Ambulatory Visit
Admission: RE | Admit: 2015-04-21 | Discharge: 2015-04-21 | Disposition: A | Discharge status: Home / Self Care | Payer: Medicare Other | Source: Ambulatory Visit | Attending: Orthopedic Surgery | Admitting: Orthopedic Surgery

## 2015-04-21 DIAGNOSIS — M8580 Other specified disorders of bone density and structure, unspecified site: Secondary | ICD-10-CM

## 2015-11-05 ENCOUNTER — Other Ambulatory Visit: Payer: Self-pay | Admitting: Family Medicine

## 2015-11-05 DIAGNOSIS — Z1231 Encounter for screening mammogram for malignant neoplasm of breast: Secondary | ICD-10-CM

## 2015-11-18 ENCOUNTER — Ambulatory Visit
Admission: RE | Admit: 2015-11-18 | Discharge: 2015-11-18 | Disposition: A | Discharge status: Home / Self Care | Payer: Medicare Other | Source: Ambulatory Visit | Attending: Family Medicine | Admitting: Family Medicine

## 2015-11-18 DIAGNOSIS — Z1231 Encounter for screening mammogram for malignant neoplasm of breast: Secondary | ICD-10-CM

## 2015-12-09 ENCOUNTER — Encounter (HOSPITAL_COMMUNITY): Payer: Self-pay | Admitting: *Deleted

## 2015-12-09 NOTE — Progress Notes (Signed)
Spoke with pt for pre-op call. Pt denies cardiac history, chest pain or sob. 

## 2015-12-09 NOTE — H&P (Signed)
Orthopaedic Trauma Service H&P  Chief Complaint: symptomatic HW R ankle HPI:   [age] y/o female s/p ORIF R pilon fracture. Pt has healed w/o issue and is functioning quite well. She does c/o pain over her hardware and [facility]   Past Medical History:  Diagnosis Date  . Arthritis   . Complication of anesthesia   . GERD (gastroesophageal reflux disease)   . High cholesterol   . Hypertension   . PONV (postoperative nausea and vomiting)    patch behind worked very well  . Vitamin D deficiency 03/06/2015    Past Surgical History:  Procedure Laterality Date  . BUNIONECTOMY Left    both feet  . COLONOSCOPY    . Laparascopy     , for Fibroids  . ORIF TIBIA FRACTURE Right 01/29/2015   Procedure: OPEN REDUCTION INTERNAL FIXATION (ORIF) RIGHT TIBIA  AND FIBULA FRACTURES;  Surgeon: Myrene GalasMichael Handy, MD;  [facility];  Service: Orthopedics;  Laterality: Right;    Family History  Problem Relation Age of Onset  . CVA Mother   . Heart disease Father   . Heart attack Sister    Social History:  reports that she has never smoked. She has never used smokeless tobacco. She reports that she drinks alcohol. She reports that she does not use drugs.  Allergies: No Known Allergies  No prescriptions prior to admission.    No results found for this or any previous visit (from the past 48 hour(s)). No results found.  Review of Systems  Constitutional: Negative for chills and fever.  Respiratory: Negative for shortness of breath.   Cardiovascular: Negative for chest pain.  Gastrointestinal: Negative for abdominal pain, nausea and vomiting.  Neurological: Negative for sensory change, speech change and headaches.    There were no vitals taken for this visit. Physical Exam  Constitutional: She is oriented to person, place, and time. She appears well-developed and well-nourished.  HENT:  Head: Normocephalic and atraumatic.  Cardiovascular: Normal rate and regular rhythm.    Respiratory: Effort normal and breath sounds normal. No respiratory distress. She has no wheezes.  Musculoskeletal:  Right Lower Extremity    Wounds well healed    nontender     Distal motor and sensory functions intact    Ext warm     + DP pulse   Neurological: She is alert and oriented to person, place, and time.     Assessment/Plan  70 y/o female with symptomatic hardware R ankle   [facility] Risks and benefits reviewed, pt wishes to proceed with surgery   Tanairi Cypert W, PA-C 12/09/2015, 9:11 PM

## 2015-12-10 ENCOUNTER — Encounter (HOSPITAL_COMMUNITY): Payer: Self-pay | Admitting: *Deleted

## 2015-12-10 ENCOUNTER — Ambulatory Visit (HOSPITAL_COMMUNITY): Payer: Medicare Other | Admitting: Anesthesiology

## 2015-12-10 ENCOUNTER — Ambulatory Visit (HOSPITAL_COMMUNITY): Payer: Medicare Other

## 2015-12-10 ENCOUNTER — Encounter (HOSPITAL_COMMUNITY)
Admission: RE | Disposition: A | Discharge status: Home / Self Care | Payer: Self-pay | Source: Ambulatory Visit | Attending: Orthopedic Surgery

## 2015-12-10 ENCOUNTER — Ambulatory Visit (HOSPITAL_COMMUNITY)
Admission: RE | Admit: 2015-12-10 | Discharge: 2015-12-10 | Disposition: A | Discharge status: Home / Self Care | Payer: Medicare Other | Source: Ambulatory Visit | Attending: Orthopedic Surgery | Admitting: Orthopedic Surgery

## 2015-12-10 DIAGNOSIS — E559 Vitamin D deficiency, unspecified: Secondary | ICD-10-CM | POA: Diagnosis not present

## 2015-12-10 DIAGNOSIS — Z969 Presence of functional implant, unspecified: Secondary | ICD-10-CM

## 2015-12-10 DIAGNOSIS — E7800 Pure hypercholesterolemia, unspecified: Secondary | ICD-10-CM | POA: Diagnosis not present

## 2015-12-10 DIAGNOSIS — Z419 Encounter for procedure for purposes other than remedying health state, unspecified: Secondary | ICD-10-CM

## 2015-12-10 DIAGNOSIS — K219 Gastro-esophageal reflux disease without esophagitis: Secondary | ICD-10-CM | POA: Diagnosis not present

## 2015-12-10 DIAGNOSIS — Z823 Family history of stroke: Secondary | ICD-10-CM | POA: Diagnosis not present

## 2015-12-10 DIAGNOSIS — Y793 Surgical instruments, materials and orthopedic devices (including sutures) associated with adverse incidents: Secondary | ICD-10-CM | POA: Diagnosis not present

## 2015-12-10 DIAGNOSIS — I10 Essential (primary) hypertension: Secondary | ICD-10-CM | POA: Diagnosis not present

## 2015-12-10 DIAGNOSIS — M1990 Unspecified osteoarthritis, unspecified site: Secondary | ICD-10-CM | POA: Diagnosis not present

## 2015-12-10 DIAGNOSIS — T8484XA Pain due to internal orthopedic prosthetic devices, implants and grafts, initial encounter: Secondary | ICD-10-CM | POA: Insufficient documentation

## 2015-12-10 HISTORY — DX: Adverse effect of unspecified anesthetic, initial encounter: T41.45XA

## 2015-12-10 HISTORY — PX: HARDWARE REMOVAL: SHX979

## 2015-12-10 HISTORY — DX: Unspecified osteoarthritis, unspecified site: M19.90

## 2015-12-10 HISTORY — DX: Nausea with vomiting, unspecified: Z98.890

## 2015-12-10 HISTORY — DX: Nausea with vomiting, unspecified: R11.2

## 2015-12-10 HISTORY — DX: Gastro-esophageal reflux disease without esophagitis: K21.9

## 2015-12-10 HISTORY — DX: Other complications of anesthesia, initial encounter: T88.59XA

## 2015-12-10 LAB — BASIC METABOLIC PANEL
ANION GAP: 12 (ref 5–15)
BUN: 16 mg/dL (ref 6–20)
CO2: 24 mmol/L (ref 22–32)
Calcium: 9.6 mg/dL (ref 8.9–10.3)
Chloride: 103 mmol/L (ref 101–111)
Creatinine, Ser: 0.73 mg/dL (ref 0.44–1.00)
GFR calc Af Amer: >60 mL/min (ref >60)
Glucose, Bld: 101 mg/dL — ABNORMAL HIGH (ref 65–99)
POTASSIUM: 3.4 mmol/L — AB (ref 3.5–5.1)
SODIUM: 139 mmol/L (ref 135–145)

## 2015-12-10 LAB — CBC
HCT: 46.4 % — ABNORMAL HIGH (ref 36.0–46.0)
Hemoglobin: 16.3 g/dL — ABNORMAL HIGH (ref 12.0–15.0)
MCH: 31.6 pg (ref 26.0–34.0)
MCHC: 35.1 g/dL (ref 30.0–36.0)
MCV: 89.9 fL (ref 78.0–100.0)
PLATELETS: 225 10*3/uL (ref 150–400)
RBC: 5.16 MIL/uL — AB (ref 3.87–5.11)
RDW: 12.7 % (ref 11.5–15.5)
WBC: 5 10*3/uL (ref 4.0–10.5)

## 2015-12-10 LAB — SURGICAL PCR SCREEN
MRSA, PCR: NEGATIVE
Staphylococcus aureus: NEGATIVE

## 2015-12-10 SURGERY — REMOVAL, HARDWARE
Anesthesia: General | Laterality: Right

## 2015-12-10 MED ORDER — MIDAZOLAM HCL 2 MG/2ML IJ SOLN: 0.5000 mg | Freq: Once | INTRAMUSCULAR | Status: DC | PRN

## 2015-12-10 MED ORDER — PROMETHAZINE HCL 25 MG/ML IJ SOLN: 6.2500 mg | INTRAMUSCULAR | Status: DC | PRN

## 2015-12-10 MED ORDER — BUPIVACAINE HCL (PF) 0.5 % IJ SOLN
INTRAMUSCULAR | Status: AC
  Filled 2015-12-10: qty 30

## 2015-12-10 MED ORDER — FENTANYL CITRATE (PF) 100 MCG/2ML IJ SOLN
INTRAMUSCULAR | Status: AC
  Filled 2015-12-10: qty 4

## 2015-12-10 MED ORDER — DIPHENHYDRAMINE HCL 50 MG/ML IJ SOLN
INTRAMUSCULAR | Status: AC
  Filled 2015-12-10: qty 1

## 2015-12-10 MED ORDER — HYDROMORPHONE HCL 2 MG/ML IJ SOLN
INTRAMUSCULAR | Status: AC
  Filled 2015-12-10: qty 1

## 2015-12-10 MED ORDER — MIDAZOLAM HCL 2 MG/2ML IJ SOLN
INTRAMUSCULAR | Status: AC
  Filled 2015-12-10: qty 2

## 2015-12-10 MED ORDER — SCOPOLAMINE 1 MG/3DAYS TD PT72
MEDICATED_PATCH | TRANSDERMAL | Status: DC | PRN
  Administered 2015-12-10: 1 via TRANSDERMAL

## 2015-12-10 MED ORDER — EPHEDRINE SULFATE 50 MG/ML IJ SOLN
INTRAMUSCULAR | Status: DC | PRN
  Administered 2015-12-10: 10 mg via INTRAVENOUS
  Administered 2015-12-10: 5 mg via INTRAVENOUS
  Administered 2015-12-10: 10 mg via INTRAVENOUS

## 2015-12-10 MED ORDER — 0.9 % SODIUM CHLORIDE (POUR BTL) OPTIME
TOPICAL | Status: DC | PRN
  Administered 2015-12-10: 1000 mL

## 2015-12-10 MED ORDER — DEXAMETHASONE SODIUM PHOSPHATE 10 MG/ML IJ SOLN
INTRAMUSCULAR | Status: DC | PRN
  Administered 2015-12-10: 10 mg via INTRAVENOUS

## 2015-12-10 MED ORDER — MEPERIDINE HCL 25 MG/ML IJ SOLN: 6.2500 mg | INTRAMUSCULAR | Status: DC | PRN

## 2015-12-10 MED ORDER — PROPOFOL 10 MG/ML IV BOLUS
INTRAVENOUS | Status: AC
  Filled 2015-12-10: qty 40

## 2015-12-10 MED ORDER — EPHEDRINE 5 MG/ML INJ
INTRAVENOUS | Status: AC
  Filled 2015-12-10: qty 10

## 2015-12-10 MED ORDER — CHLORHEXIDINE GLUCONATE 4 % EX LIQD: 60.0000 mL | Freq: Once | CUTANEOUS | Status: DC

## 2015-12-10 MED ORDER — ONDANSETRON HCL 4 MG/2ML IJ SOLN
INTRAMUSCULAR | Status: AC
  Filled 2015-12-10: qty 2

## 2015-12-10 MED ORDER — HYDROMORPHONE HCL 1 MG/ML IJ SOLN
0.2500 mg | INTRAMUSCULAR | Status: DC | PRN
  Administered 2015-12-10: 0.5 mg via INTRAVENOUS

## 2015-12-10 MED ORDER — LIDOCAINE HCL (CARDIAC) 20 MG/ML IV SOLN
INTRAVENOUS | Status: DC | PRN
  Administered 2015-12-10: 60 mg via INTRAVENOUS

## 2015-12-10 MED ORDER — LIDOCAINE 2% (20 MG/ML) 5 ML SYRINGE
INTRAMUSCULAR | Status: AC
  Filled 2015-12-10: qty 5

## 2015-12-10 MED ORDER — CEFAZOLIN SODIUM-DEXTROSE 2-4 GM/100ML-% IV SOLN
2.0000 g | INTRAVENOUS | Status: AC
  Administered 2015-12-10: 2 g via INTRAVENOUS
  Filled 2015-12-10: qty 100

## 2015-12-10 MED ORDER — BUPIVACAINE HCL (PF) 0.5 % IJ SOLN
INTRAMUSCULAR | Status: DC | PRN
  Administered 2015-12-10: 15 mL

## 2015-12-10 MED ORDER — DEXAMETHASONE SODIUM PHOSPHATE 10 MG/ML IJ SOLN
INTRAMUSCULAR | Status: AC
  Filled 2015-12-10: qty 1

## 2015-12-10 MED ORDER — MIDAZOLAM HCL 5 MG/5ML IJ SOLN
INTRAMUSCULAR | Status: DC | PRN
  Administered 2015-12-10: 2 mg via INTRAVENOUS

## 2015-12-10 MED ORDER — SCOPOLAMINE 1 MG/3DAYS TD PT72
MEDICATED_PATCH | TRANSDERMAL | Status: AC
  Filled 2015-12-10: qty 1

## 2015-12-10 MED ORDER — LACTATED RINGERS IV SOLN
INTRAVENOUS | Status: DC
  Administered 2015-12-10 (×2): via INTRAVENOUS

## 2015-12-10 MED ORDER — FENTANYL CITRATE (PF) 100 MCG/2ML IJ SOLN
INTRAMUSCULAR | Status: DC | PRN
  Administered 2015-12-10: 50 ug via INTRAVENOUS
  Administered 2015-12-10: 25 ug via INTRAVENOUS

## 2015-12-10 MED ORDER — ONDANSETRON HCL 4 MG/2ML IJ SOLN
INTRAMUSCULAR | Status: DC | PRN
  Administered 2015-12-10: 4 mg via INTRAVENOUS

## 2015-12-10 MED ORDER — DIPHENHYDRAMINE HCL 50 MG/ML IJ SOLN
INTRAMUSCULAR | Status: DC | PRN
  Administered 2015-12-10: 25 mg via INTRAVENOUS

## 2015-12-10 MED ORDER — PROPOFOL 10 MG/ML IV BOLUS
INTRAVENOUS | Status: DC | PRN
  Administered 2015-12-10: 200 mg via INTRAVENOUS

## 2015-12-10 SURGICAL SUPPLY — 67 items
BANDAGE ACE 4X5 VEL STRL LF (GAUZE/BANDAGES/DRESSINGS) ×3 IMPLANT
BANDAGE ACE 6X5 VEL STRL LF (GAUZE/BANDAGES/DRESSINGS) ×3 IMPLANT
BANDAGE ESMARK 6X9 LF (GAUZE/BANDAGES/DRESSINGS) ×1 IMPLANT
BNDG COHESIVE 6X5 TAN STRL LF (GAUZE/BANDAGES/DRESSINGS) ×3 IMPLANT
BNDG ESMARK 6X9 LF (GAUZE/BANDAGES/DRESSINGS) ×3
BNDG GAUZE ELAST 4 BULKY (GAUZE/BANDAGES/DRESSINGS) ×3 IMPLANT
BRUSH SCRUB DISP (MISCELLANEOUS) ×6 IMPLANT
CLEANER TIP ELECTROSURG 2X2 (MISCELLANEOUS) ×3 IMPLANT
CLOSURE WOUND 1/2 X4 (GAUZE/BANDAGES/DRESSINGS)
COVER SURGICAL LIGHT HANDLE (MISCELLANEOUS) ×6 IMPLANT
CUFF TOURNIQUET SINGLE 18IN (TOURNIQUET CUFF) IMPLANT
CUFF TOURNIQUET SINGLE 24IN (TOURNIQUET CUFF) IMPLANT
CUFF TOURNIQUET SINGLE 34IN LL (TOURNIQUET CUFF) IMPLANT
DRAPE C-ARM 42X72 X-RAY (DRAPES) IMPLANT
DRAPE C-ARMOR (DRAPES) ×3 IMPLANT
DRAPE OEC MINIVIEW 54X84 (DRAPES) ×3 IMPLANT
DRAPE U-SHAPE 47X51 STRL (DRAPES) ×3 IMPLANT
DRSG ADAPTIC 3X8 NADH LF (GAUZE/BANDAGES/DRESSINGS) ×3 IMPLANT
ELECT REM PT RETURN 9FT ADLT (ELECTROSURGICAL) ×3
ELECTRODE REM PT RTRN 9FT ADLT (ELECTROSURGICAL) ×1 IMPLANT
EVACUATOR 1/8 PVC DRAIN (DRAIN) IMPLANT
GAUZE SPONGE 4X4 12PLY STRL (GAUZE/BANDAGES/DRESSINGS) ×3 IMPLANT
GLOVE BIO SURGEON STRL SZ7.5 (GLOVE) ×3 IMPLANT
GLOVE BIO SURGEON STRL SZ8 (GLOVE) ×3 IMPLANT
GLOVE BIOGEL PI IND STRL 7.5 (GLOVE) ×1 IMPLANT
GLOVE BIOGEL PI IND STRL 8 (GLOVE) ×1 IMPLANT
GLOVE BIOGEL PI INDICATOR 7.5 (GLOVE) ×2
GLOVE BIOGEL PI INDICATOR 8 (GLOVE) ×2
GOWN STRL REUS W/ TWL LRG LVL3 (GOWN DISPOSABLE) ×2 IMPLANT
GOWN STRL REUS W/ TWL XL LVL3 (GOWN DISPOSABLE) ×1 IMPLANT
GOWN STRL REUS W/TWL LRG LVL3 (GOWN DISPOSABLE) ×4
GOWN STRL REUS W/TWL XL LVL3 (GOWN DISPOSABLE) ×2
KIT BASIN OR (CUSTOM PROCEDURE TRAY) ×3 IMPLANT
KIT ROOM TURNOVER OR (KITS) ×3 IMPLANT
MANIFOLD NEPTUNE II (INSTRUMENTS) ×3 IMPLANT
NEEDLE 22X1 1/2 (OR ONLY) (NEEDLE) IMPLANT
NS IRRIG 1000ML POUR BTL (IV SOLUTION) ×3 IMPLANT
PACK ORTHO EXTREMITY (CUSTOM PROCEDURE TRAY) ×3 IMPLANT
PAD ABD 8X10 STRL (GAUZE/BANDAGES/DRESSINGS) ×3 IMPLANT
PAD ARMBOARD 7.5X6 YLW CONV (MISCELLANEOUS) ×6 IMPLANT
PAD CAST 4YDX4 CTTN HI CHSV (CAST SUPPLIES) ×1 IMPLANT
PADDING CAST ABS 6INX4YD NS (CAST SUPPLIES) ×2
PADDING CAST ABS COTTON 6X4 NS (CAST SUPPLIES) ×1 IMPLANT
PADDING CAST COTTON 4X4 STRL (CAST SUPPLIES) ×2
PADDING CAST COTTON 6X4 STRL (CAST SUPPLIES) ×9 IMPLANT
SPONGE GAUZE 4X4 12PLY STER LF (GAUZE/BANDAGES/DRESSINGS) ×3 IMPLANT
SPONGE LAP 18X18 X RAY DECT (DISPOSABLE) ×3 IMPLANT
SPONGE SCRUB IODOPHOR (GAUZE/BANDAGES/DRESSINGS) ×3 IMPLANT
STAPLER VISISTAT 35W (STAPLE) IMPLANT
STOCKINETTE IMPERVIOUS LG (DRAPES) ×3 IMPLANT
STRIP CLOSURE SKIN 1/2X4 (GAUZE/BANDAGES/DRESSINGS) IMPLANT
SUCTION FRAZIER HANDLE 10FR (MISCELLANEOUS)
SUCTION TUBE FRAZIER 10FR DISP (MISCELLANEOUS) IMPLANT
SUT ETHILON 3 0 PS 1 (SUTURE) IMPLANT
SUT PDS AB 2-0 CT1 27 (SUTURE) IMPLANT
SUT VIC AB 0 CT1 27 (SUTURE)
SUT VIC AB 0 CT1 27XBRD ANBCTR (SUTURE) IMPLANT
SUT VIC AB 2-0 CT1 27 (SUTURE)
SUT VIC AB 2-0 CT1 TAPERPNT 27 (SUTURE) IMPLANT
SYR CONTROL 10ML LL (SYRINGE) IMPLANT
TOWEL OR 17X24 6PK STRL BLUE (TOWEL DISPOSABLE) ×6 IMPLANT
TOWEL OR 17X26 10 PK STRL BLUE (TOWEL DISPOSABLE) ×6 IMPLANT
TUBE CONNECTING 12'X1/4 (SUCTIONS) ×1
TUBE CONNECTING 12X1/4 (SUCTIONS) ×2 IMPLANT
UNDERPAD 30X30 (UNDERPADS AND DIAPERS) ×3 IMPLANT
WATER STERILE IRR 1000ML POUR (IV SOLUTION) ×6 IMPLANT
YANKAUER SUCT BULB TIP NO VENT (SUCTIONS) ×3 IMPLANT

## 2015-12-10 NOTE — Anesthesia Procedure Notes (Signed)
Procedure Name: LMA Insertion Date/Time: 12/10/2015 8:22 AM Performed by: Margaree MackintoshYACOUB, Gerber Penza B Pre-anesthesia Checklist: Patient identified, Emergency Drugs available, Suction available, Patient being monitored and Timeout performed Patient Re-evaluated:Patient Re-evaluated prior to inductionOxygen Delivery Method: Circle system utilized Preoxygenation: Pre-oxygenation with 100% oxygen Intubation Type: IV induction LMA: LMA inserted LMA Size: 4.0 Placement Confirmation: positive ETCO2 and breath sounds checked- equal and bilateral Tube secured with: Tape Dental Injury: Teeth and Oropharynx as per pre-operative assessment

## 2015-12-10 NOTE — Anesthesia Preprocedure Evaluation (Addendum)
Anesthesia Evaluation  Patient identified by MRN, date of birth, ID band Patient awake    Reviewed: Allergy & Precautions, NPO status , Patient's Chart, lab work & pertinent test results  History of Anesthesia Complications (+) PONV and history of anesthetic complications  Airway Mallampati: II  TM Distance: >3 FB Neck ROM: Full    Dental  (+) Dental Advisory Given   Pulmonary neg pulmonary ROS,    breath sounds clear to auscultation       Cardiovascular hypertension, Pt. on medications (-) angina Rhythm:Regular Rate:Normal     Neuro/Psych negative neurological ROS     GI/Hepatic Neg liver ROS, GERD  Controlled,  Endo/Other  negative endocrine ROS  Renal/GU negative Renal ROS     Musculoskeletal  (+) Arthritis , Osteoarthritis,    Abdominal   Peds  Hematology negative hematology ROS (+)   Anesthesia Other Findings   Reproductive/Obstetrics                            Anesthesia Physical Anesthesia Plan  ASA: II  Anesthesia Plan: General   Post-op Pain Management:    Induction: Intravenous  Airway Management Planned: LMA  Additional Equipment:   Intra-op Plan:   Post-operative Plan:   Informed Consent: I have reviewed the patients History and Physical, chart, labs and discussed the procedure including the risks, benefits and alternatives for the proposed anesthesia with the patient or authorized representative who has indicated his/her understanding and acceptance.   Dental advisory given  Plan Discussed with: CRNA and Surgeon  Anesthesia Plan Comments: (Plan routine monitors, GA- LMA OK)       Anesthesia Quick Evaluation

## 2015-12-10 NOTE — Brief Op Note (Signed)
12/10/2015  10:23 AM  PATIENT:  Bolivar HawMartha A Kildow  70 y.o. female  PRE-OPERATIVE DIAGNOSIS:  SYPMATIC HARDWARE RIGHT ANKLE  POST-OPERATIVE DIAGNOSIS:  SYPMATIC HARDWARE RIGHT ANKLE  PROCEDURE:  Procedure(s): HARDWARE REMOVAL RIGHT ANKLE (Right)  SURGEON:  Surgeon(s) and Role:    * Myrene GalasMichael Berklie Dethlefs, MD - Primary  ASSISTANTS: none   ANESTHESIA:   general  EBL:  Total I/O In: 1000 [I.V.:1000] Out: 30 [Blood:30]  BLOOD ADMINISTERED:none  DRAINS: none   LOCAL MEDICATIONS USED:  MARCAINE     SPECIMEN:  No Specimen  DISPOSITION OF SPECIMEN:  N/A  COUNTS:  YES  TOURNIQUET:  * No tourniquets in log *  DICTATION: .Other Dictation: Dictation Number [id number]  PLAN OF CARE: Discharge to home after PACU  PATIENT DISPOSITION:  PACU - hemodynamically stable.   Delay start of Pharmacological VTE agent (>24hrs) due to surgical blood loss or risk of bleeding: no

## 2015-12-10 NOTE — Anesthesia Postprocedure Evaluation (Signed)
Anesthesia Post Note  Patient: [name]  Procedure(s) Performed: Procedure(s) (LRB): HARDWARE REMOVAL RIGHT ANKLE (Right)  Patient location during evaluation: PACU Anesthesia Type: General Level of consciousness: awake and alert, oriented and patient cooperative Pain management: pain level controlled Vital Signs Assessment: post-procedure vital signs reviewed and stable Respiratory status: spontaneous breathing, nonlabored ventilation and respiratory function stable Cardiovascular status: blood pressure returned to baseline and stable Postop Assessment: no signs of nausea or vomiting Anesthetic complications: no    Last Vitals:  Vitals:   12/10/15 1000 12/10/15 1015  BP: (!) 151/84 (!) 145/90  Pulse: 76 79  Resp: 14 14  Temp: 36.2 C     Last Pain:  Vitals:   12/10/15 1015  TempSrc:   PainSc: 4                  Marybel Alcott,E. Blade Scheff

## 2015-12-10 NOTE — Transfer of Care (Signed)
Immediate Anesthesia Transfer of Care Note  Patient: [name]  Procedure(s) Performed: Procedure(s): HARDWARE REMOVAL RIGHT ANKLE (Right)  Patient Location: PACU  Anesthesia Type:General  Level of Consciousness: awake, alert  and oriented  Airway & Oxygen Therapy: Patient Spontanous Breathing and Patient connected to nasal cannula oxygen  Post-op Assessment: Report given to RN and Post -op Vital signs reviewed and stable  Post vital signs: Reviewed and stable  Last Vitals:  Vitals:   12/10/15 0632  BP: (!) 151/77  Pulse: 60  Resp: 20  Temp: 36.7 C    Last Pain:  Vitals:   12/10/15 0632  TempSrc: Oral      Patients Stated Pain Goal: 3 (12/10/15 0701)  Complications: No apparent anesthesia complications

## 2015-12-10 NOTE — Discharge Instructions (Signed)
[facility]th[facility] Trauma Service Discharge Instructions   General Discharge Instructions  WEIGHT BEARING STATUS: Weightbearing as tolerated  RANGE OF MOTION/ACTIVITY: as tolerated  Wound Care:daily dressing changes starting on 12/12/2015. See below  PAIN MEDICATION USE AND EXPECTATIONS  You have likely been given narcotic medications to help control your pain.  After a traumatic event that results in an fracture (broken bone) with or without surgery, it is ok to use narcotic pain medications to help control one's pain.  We understand that everyone responds to pain differently and each individual patient will be evaluated on a regular basis for the continued need for narcotic medications. Ideally, narcotic medication use should last no more than 6-8 weeks (coinciding with fracture healing).   As a patient it is your responsibility as well to monitor narcotic medication use and report the amount and frequency you use these medications when you come to your office visit.   We would also advise that if you are using narcotic medications, you should take a dose prior to therapy to maximize you participation.  IF YOU ARE ON NARCOTIC MEDICATIONS IT IS NOT PERMISSIBLE TO OPERATE A MOTOR VEHICLE (MOTORCYCLE/CAR/TRUCK/MOPED) OR HEAVY MACHINERY DO NOT MIX NARCOTICS WITH OTHER CNS (CENTRAL NERVOUS SYSTEM) DEPRESSANTS SUCH AS ALCOHOL  Diet: as you were eating previously.  Can use over the counter stool softeners and bowel preparations, such as Miralax, to help with bowel movements.  Narcotics can be constipating.  Be sure to drink plenty of fluids    STOP SMOKING OR USING NICOTINE PRODUCTS!!!!  As discussed nicotine severely impairs your body's ability to heal surgical and traumatic wounds but also impairs bone healing.  Wounds and bone heal by forming microscopic blood vessels (angiogenesis) and nicotine is a vasoconstrictor (essentially, shrinks blood vessels).  Therefore, if vasoconstriction occurs to these  microscopic blood vessels they essentially disappear and are unable to deliver necessary nutrients to the healing tissue.  This is one modifiable factor that you can do to dramatically increase your chances of healing your injury.    (This means no smoking, no nicotine gum, patches, etc)  DO NOT USE NONSTEROIDAL ANTI-INFLAMMATORY DRUGS (NSAID'S)  Using products such as Advil (ibuprofen), Aleve (naproxen), Motrin (ibuprofen) for additional pain control during fracture healing can delay and/or prevent the healing response.  If you would like to take over the counter (OTC) medication, Tylenol (acetaminophen) is ok.  However, some narcotic medications that are given for pain control contain acetaminophen as well. Therefore, you should not exceed more than 4000 mg of tylenol in a day if you do not have liver disease.  Also note that there are may OTC medicines, such as cold medicines and allergy medicines that my contain tylenol as well.  If you have any questions about medications and/or interactions please ask your doctor/PA or your pharmacist.      ICE AND ELEVATE INJURED/OPERATIVE EXTREMITY  Using ice and elevating the injured extremity above your heart can help with swelling and pain control.  Icing in a pulsatile fashion, such as 20 minutes on and 20 minutes off, can be followed.    Do not place ice directly on skin. Make sure there is a barrier between to skin and the ice pack.    Using frozen items such as frozen peas works well as the conform nicely to the are that needs to be iced.  USE AN ACE WRAP OR TED HOSE FOR SWELLING CONTROL  In addition to icing and elevation, Ace wraps or TED hose are  used to help limit and resolve swelling.  It is recommended to use Ace wraps or TED hose until you are informed to stop.    When using Ace Wraps start the wrapping distally (farthest away from the body) and wrap proximally (closer to the body)   Example: If you had surgery on your leg or thing and you do not  have a splint on, start the ace wrap at the toes and work your way up to the thigh        If you had surgery on your upper extremity and do not have a splint on, start the ace wrap at your fingers and work your way up to the upper arm  IF YOU ARE IN A SPLINT OR CAST DO NOT REMOVE IT FOR ANY REASON   If your splint gets wet for any reason please contact the office immediately. You may shower in your splint or cast as long as you keep it dry.  This can be done by wrapping in a cast cover or garbage back (or similar)  Do Not stick any thing down your splint or cast such as pencils, money, or hangers to try and scratch yourself with.  If you feel itchy take benadryl as prescribed on the bottle for itching  IF YOU ARE IN A CAM BOOT (BLACK BOOT)  You may remove boot periodically. Perform daily dressing changes as noted below.  Wash the liner of the boot regularly and wear a sock when wearing the boot. It is recommended that you sleep in the boot until told otherwise  CALL THE OFFICE WITH ANY QUESTIONS OR CONCERNS: [phone]        Discharge Pin Site Instructions  Dress pins daily with Kerlix roll starting on POD 2. Wrap the Kerlix so that it tamps the skin down around the pin-skin interface to prevent/limit motion of the skin relative to the pin.  (Pin-skin motion is the primary cause of pain and infection related to external fixator pin sites).  Remove any crust or coagulum that may obstruct drainage with a saline moistened gauze or soap and water.  After POD 3, if there is no discernable drainage on the pin site dressing, the interval for change can by increased to every other day.  You may shower with the fixator, cleaning all pin sites gently with soap and water.  If you have a surgical wound this needs to be completely dry and without drainage before showering.  The extremity can be lifted by the fixator to facilitate wound care and transfers.  Notify the office/Doctor if you  experience increasing drainage, redness, or pain from a pin site, or if you notice purulent (thick, snot-like) drainage.  Discharge Wound Care Instructions  Do NOT apply any ointments, solutions or lotions to pin sites or surgical wounds.  These prevent needed drainage and even though solutions like hydrogen peroxide kill bacteria, they also damage cells lining the pin sites that help fight infection.  Applying lotions or ointments can keep the wounds moist and can cause them to breakdown and open up as well. This can increase the risk for infection. When in doubt call the office.  Surgical incisions should be dressed daily.  If any drainage is noted, use one layer of adaptic, then gauze, Kerlix, and an ace wrap.  Once the incision is completely dry and without drainage, it may be left open to air out.  Showering may begin 36-48 hours later.  Cleaning gently with soap and water.  Traumatic wounds should be dressed daily as well.    One layer of adaptic, gauze, Kerlix, then ace wrap.  The adaptic can be discontinued once the draining has ceased    If you have a wet to dry dressing: wet the gauze with saline the squeeze as much saline out so the gauze is moist (not soaking wet), place moistened gauze over wound, then place a dry gauze over the moist one, followed by Kerlix wrap, then ace wrap.

## 2015-12-11 ENCOUNTER — Encounter (HOSPITAL_COMMUNITY): Payer: Self-pay | Admitting: Orthopedic Surgery

## 2015-12-11 NOTE — Op Note (Signed)
NAME:  [name], [name]                  ACCOUNT NO.:  [id number]  MEDICAL RECORD NO.:  [id number]  LOCATION:  [facility]                         FACILITY:  [facility]  PHYSICIAN:  Doralee Albino. Carola Frost, M.D. DATE OF BIRTH:  1945/12/24  DATE OF PROCEDURE:  12/10/2015 DATE OF DISCHARGE:                              OPERATIVE REPORT   PREOPERATIVE DIAGNOSIS:  Symptomatic hardware, right tibia and fibula.  POSTOPERATIVE DIAGNOSIS:  Symptomatic hardware, right tibia and fibula.  PROCEDURE:  Removal of deep implants, right tibia and right fibula.  SURGEON:  Doralee Albino. Carola Frost, M.D.  ASSISTANT:  None.  ANESTHESIA:  General.  COMPLICATIONS:  None.  ESTIMATED BLOOD LOSS:  30 mL.  DISPOSITION:  To PACU.  CONDITION:  Stable.  BRIEF SUMMARY AND INDICATION FOR PROCEDURE:  [name] is a 70 year old female status post ORIF of a distal tibial pilon fracture including the tibia and fibula.  She went on to unite, but has had persistent pain and tenderness of the hardware.  I discussed with her the risks and benefits of hardware removal including the potential for nerve injury, vessel injury, DVT, PE, failure to alleviate symptoms, need for further surgery, and others.  We also specifically discussed the syndesmotic screw.  She had no anterior syndesmotic tenderness, but rather had pain directly over the plate, particularly on the medial side.  I did discuss with her potentially coring out the bone in order to remove the broken syndesmotic screw and we agreed mutually that this would pose more risk than benefit.  BRIEF SUMMARY OF PROCEDURE:  The patient was taken to operating room after administration of preop antibiotics.  Her right lower extremity was prepped and draped in usual sterile fashion.  No tourniquet was used during the procedure.  I began medially, carefully protecting the saphenous vein, identifying the plate and screws and removing them proximally.  I used percutaneous stab wounds, removed  the screws without difficulty, and then was able to gently mobilize the plate and withdraw it from the distal wound.  I then turned my attention laterally.  A separate incision was made over the fibula and dissection carried down again protecting a large caliber vein in the area.  I exposed the heads of the screws and removed them and the plate used as a washer, the more proximal screw was fractured.  Wounds were irrigated thoroughly after C- arm showed removal of all these as well as additional lag screw from the anterolateral corner of the tibia and an additional lag screw from the anteromedial side.  Wounds were then closed in standard layered fashion with 2-0 Vicryl, 3-0 nylon.  Sterile gently compressive dressing was applied from foot to knee.  The patient was awakened from anesthesia and transported to the PACU in stable condition.  PROGNOSIS:  The patient will be weightbearing as tolerated with unrestricted range of motion of the knee and ankle.  She will not require pharmacologic DVT prophylaxis.  I will plan to see her back in the office in 10-14 days for removal of sutures.  She can shower and change her dressing in 48-72 hours.  She will ice, elevate, and use narcotics from her first operation, if  she requires them, but did not use any after the index procedure and therefore none is anticipated at this time.     Doralee AlbinoMichael H. Carola FrostHandy, M.D.     [facility]  D:  12/10/2015  T:  12/11/2015  Job:  [id number]

## 2016-01-11 LAB — BASIC METABOLIC PANEL
BUN: 12 mg/dL (ref 4–21)
Creatinine: 0.8 mg/dL (ref 0.5–1.1)
Glucose: 98 mg/dL
Potassium: 3.9 mmol/L (ref 3.4–5.3)
SODIUM: 140 mmol/L (ref 137–147)

## 2016-01-11 LAB — LIPID PANEL
Cholesterol: 220 mg/dL — AB (ref 0–200)
HDL: 93 mg/dL — AB (ref 35–70)
LDL CALC: 115 mg/dL
TRIGLYCERIDES: 59 mg/dL (ref 40–160)

## 2016-01-11 LAB — HEMOGLOBIN A1C: Hemoglobin A1C: 5.7

## 2016-01-11 LAB — HEPATIC FUNCTION PANEL
ALT: 27 U/L (ref 7–35)
AST: 25 U/L (ref 13–35)
Alkaline Phosphatase: 81 U/L (ref 25–125)
BILIRUBIN, TOTAL: 0.7 mg/dL

## 2016-02-01 LAB — FECAL OCCULT BLOOD, GUAIAC: Fecal Occult Blood: NEGATIVE

## 2016-05-05 ENCOUNTER — Telehealth: Payer: Self-pay | Admitting: *Deleted

## 2016-05-05 NOTE — Telephone Encounter (Signed)
PreVisit Call made. Pt states she filled out her New Patient Packet and will bring it in to her appt.

## 2016-05-09 ENCOUNTER — Ambulatory Visit (INDEPENDENT_AMBULATORY_CARE_PROVIDER_SITE_OTHER): Payer: Medicare Other | Admitting: Family Medicine

## 2016-05-09 ENCOUNTER — Encounter: Payer: Self-pay | Admitting: Family Medicine

## 2016-05-09 VITALS — BP 132/82 | HR 63 | Temp 98.2°F | Ht 62.0 in | Wt 149.2 lb

## 2016-05-09 DIAGNOSIS — K219 Gastro-esophageal reflux disease without esophagitis: Secondary | ICD-10-CM

## 2016-05-09 DIAGNOSIS — M940 Chondrocostal junction syndrome [Tietze]: Secondary | ICD-10-CM | POA: Diagnosis not present

## 2016-05-09 DIAGNOSIS — J301 Allergic rhinitis due to pollen: Secondary | ICD-10-CM | POA: Diagnosis not present

## 2016-05-09 DIAGNOSIS — R0789 Other chest pain: Secondary | ICD-10-CM

## 2016-05-09 NOTE — Progress Notes (Signed)
[name] is a 71 y.o. female is here to [facility].   History of Present Illness:   Chest Pain   This is a recurrent problem. The current episode started more than 1 month ago. The problem occurs intermittently. The pain is mild. The quality of the pain is described as dull. The pain does not radiate. Pertinent negatives include no abdominal pain, back pain, claudication, cough, diaphoresis, dizziness, exertional chest pressure, fever, headaches, hemoptysis, irregular heartbeat, lower extremity edema, malaise/fatigue, nausea, near-syncope, numbness, orthopnea, palpitations, PND, shortness of breath, sputum production, syncope, vomiting or weakness. Associated with: lying in an awkward position while sleeping. She has tried nothing for the symptoms.  Her past medical history is significant for hyperlipidemia and hypertension.    Health Maintenance Due  Topic Date Due  . Hepatitis C Screening  09/16/1945  . TETANUS/TDAP  06/15/1966  . COLONOSCOPY  06/14/1997  . PNA vac Low Risk Adult (1 of 2 - PCV13) 06/14/2012    PMHx, SurgHx, SocialHx, Medications, and Allergies were reviewed in the Visit Navigator and updated as appropriate.   Past Medical History:  Diagnosis Date  . Arthritis   . Complication of anesthesia   . GERD (gastroesophageal reflux disease)   . High cholesterol   . Hypertension   . PONV (postoperative nausea and vomiting)   . Seasonal allergies   . Vitamin D deficiency    Past Surgical History:  Procedure Laterality Date  . BUNIONECTOMY Bilateral   . COLONOSCOPY    . MYOMECTOMY    . ORIF TIBIA FRACTURE, HARDWARE REMOVAL Right 01/29/2015 12/10/2015   Family History  Problem Relation Age of Onset  . CVA Mother   . Heart disease Father   . Diabetes Father   . Heart attack Sister    Social History  Substance Use Topics  . Smoking status: Never Smoker  . Smokeless tobacco: Never Used  . Alcohol use 0.0 oz/week    3 - 4 Glasses of wine per week    Current Medications and Allergies:   .  aspirin 81 MG tablet, Take 81 mg by mouth daily., Disp: , Rfl:  .  co-enzyme Q-10 30 MG capsule, Take 30 mg by mouth daily., Disp: , Rfl:  .  diphenhydrAMINE (BENADRYL) 25 MG tablet, Take 25 mg by mouth as needed for allergies. , Disp: , Rfl:  .  fexofenadine (ALLEGRA) 180 MG tablet, Take 180 mg by mouth daily as needed for allergies or rhinitis., Disp: , Rfl:  .  fluticasone (FLONASE) 50 MCG/ACT nasal spray, Place 2 sprays into both nostrils as needed for allergies or rhinitis., Disp: , Rfl:  .  hydrochlorothiazide (HYDRODIURIL) 25 MG tablet, Take 25 mg by mouth daily., Disp: , Rfl:  .  Ketotifen Fumarate (ALLERGY EYE DROPS OP), Apply 1 drop to eye daily as needed (allergies)., Disp: , Rfl:  .  simvastatin (ZOCOR) 40 MG tablet, Take 40 mg by mouth every evening. , Disp: , Rfl:  .  vitamin C (VITAMIN C) 500 MG tablet, Take 1 tablet (500 mg total) by mouth daily., Disp: 60 tablet, Rfl: 2  No Known Allergies   Review of Systems:   Review of Systems  Constitutional: Negative for chills, diaphoresis, fever and malaise/fatigue.  HENT: Negative for congestion, ear pain, sinus pain and sore throat.   Eyes: Negative for blurred vision and double vision.  Respiratory: Negative for cough, hemoptysis, sputum production, shortness of breath and wheezing.   Cardiovascular: Positive for chest pain. Negative for  palpitations, orthopnea, claudication, leg swelling, syncope, PND and near-syncope.  Gastrointestinal: Positive for heartburn. Negative for abdominal pain, blood in stool, constipation, diarrhea, melena, nausea and vomiting.  Genitourinary: Negative for dysuria.  Musculoskeletal: Negative for back pain, joint pain and neck pain.  Skin: Negative for rash.  Neurological: Negative for dizziness, weakness, numbness and headaches.  Psychiatric/Behavioral: Negative for depression, hallucinations and memory loss.    Vitals:   Vitals:   05/09/16 0921   BP: 132/82  Pulse: 63  Temp: 98.2 F (36.8 C)  TempSrc: Oral  SpO2: 97%  Weight: 149 lb 3.2 oz (67.7 kg)  Height:  (1.575 m)     Body mass index is 27.29 kg/m.   Physical Exam:   Physical Exam  Constitutional: She appears well-nourished.  HENT:  Head: Normocephalic and atraumatic.  Eyes: EOM are normal. Pupils are equal, round, and reactive to light.  Neck: Normal range of motion. Neck supple.  Cardiovascular: Normal rate, regular rhythm, normal heart sounds and intact distal pulses.   Pulmonary/Chest: Effort normal.  Abdominal: Soft.  Skin: Skin is warm.  Psychiatric: She has a normal mood and affect. Her behavior is normal.  Nursing note and vitals reviewed.   Assessment and Plan:   [name] was seen today for establish care.  Diagnoses and all orders for this visit:  Costochondritis Comments: Symptomatic care and red flags reviewed.  Atypical chest pain Comments: EKG bradycardia, regular rhythm, without concerning changes. Orders: -     EKG 12-Lead  Chronic seasonal allergic rhinitis due to pollen Comments: Continue current medications.  Gastroesophageal reflux disease without esophagitis Comments: Continue current medications.    . Reviewed expectations re: course of current medical issues. . Discussed self-management of symptoms. . Outlined signs and symptoms indicating need for more acute intervention. . Patient verbalized understanding and all questions were answered. . See orders for this visit as documented in the electronic medical record. . Patient received an After Visit Summary.  Records requested if needed. I spent 30 minutes with this patient, greater than 50% was face-to-face time counseling regarding the above diagnoses.  CMA served as Neurosurgeon during this visit. History, Physical, and Plan performed by medical provider. Documentation and orders reviewed and attested to. Helane Rima, D.O.  Helane Rima, D.O. [place] [place] 05/13/2016   Follow-up: Return in about 6 months (around 11/09/2016) for CPE.  No orders of the defined types were placed in this encounter.  Medications Discontinued During This Encounter  Medication Reason  . Calcium Carb-Cholecalciferol (CALCIUM 1000 + D PO) Error  . cholecalciferol 1000 UNITS tablet Error   Orders Placed This Encounter  Procedures  . EKG 12-Lead

## 2016-05-09 NOTE — Progress Notes (Signed)
Pre visit review using our clinic review tool, if applicable. No additional management support is needed unless otherwise documented below in the visit note. 

## 2016-05-13 ENCOUNTER — Encounter: Payer: Self-pay | Admitting: Family Medicine

## 2016-05-13 DIAGNOSIS — J302 Other seasonal allergic rhinitis: Secondary | ICD-10-CM | POA: Insufficient documentation

## 2016-05-13 DIAGNOSIS — K219 Gastro-esophageal reflux disease without esophagitis: Secondary | ICD-10-CM | POA: Insufficient documentation

## 2016-05-16 ENCOUNTER — Encounter: Payer: Self-pay | Admitting: Surgical

## 2016-05-16 ENCOUNTER — Encounter: Payer: Self-pay | Admitting: Family Medicine

## 2016-05-20 LAB — HM COLONOSCOPY

## 2016-05-20 LAB — COLOGUARD

## 2016-06-20 ENCOUNTER — Telehealth: Payer: Self-pay | Admitting: Family Medicine

## 2016-06-20 NOTE — Telephone Encounter (Signed)
Rec'd from [facility] forward 47 pages to [facility].Wallace SaffordErica

## 2016-08-03 ENCOUNTER — Telehealth: Payer: Self-pay | Admitting: Family Medicine

## 2016-08-03 NOTE — Telephone Encounter (Signed)
Left pt message asking to call Allison back directly at [phone] to schedule AWV. Thanks! °  °*NOTE* Please schedule after 01/14/17 °

## 2016-09-23 NOTE — Telephone Encounter (Signed)
Scheduled 12/19/17

## 2016-09-23 NOTE — Telephone Encounter (Signed)
Left pt message asking to call Revonda Standardllison back directly at [phone] to schedule AWV. Thanks!  *NOTE* Please schedule after 01/14/17

## 2016-10-27 ENCOUNTER — Other Ambulatory Visit: Payer: Self-pay | Admitting: Family Medicine

## 2016-10-27 DIAGNOSIS — Z1231 Encounter for screening mammogram for malignant neoplasm of breast: Secondary | ICD-10-CM

## 2016-11-09 ENCOUNTER — Encounter: Payer: Medicare Other | Admitting: Family Medicine

## 2016-11-15 ENCOUNTER — Encounter: Payer: Medicare Other | Admitting: Family Medicine

## 2016-11-21 ENCOUNTER — Encounter: Payer: Self-pay | Admitting: Family Medicine

## 2016-11-21 ENCOUNTER — Ambulatory Visit (INDEPENDENT_AMBULATORY_CARE_PROVIDER_SITE_OTHER): Payer: Medicare Other | Admitting: Family Medicine

## 2016-11-21 VITALS — BP 134/78 | HR 59 | Temp 97.8°F | Ht 62.0 in | Wt 146.6 lb

## 2016-11-21 DIAGNOSIS — Z0000 Encounter for general adult medical examination without abnormal findings: Secondary | ICD-10-CM

## 2016-11-21 DIAGNOSIS — Z1159 Encounter for screening for other viral diseases: Secondary | ICD-10-CM | POA: Diagnosis not present

## 2016-11-21 DIAGNOSIS — Z23 Encounter for immunization: Secondary | ICD-10-CM | POA: Diagnosis not present

## 2016-11-21 DIAGNOSIS — Z13220 Encounter for screening for lipoid disorders: Secondary | ICD-10-CM | POA: Diagnosis not present

## 2016-11-21 DIAGNOSIS — R5383 Other fatigue: Secondary | ICD-10-CM | POA: Diagnosis not present

## 2016-11-21 LAB — CBC WITH DIFFERENTIAL/PLATELET
Basophils Absolute: 0 10*3/uL (ref 0.0–0.1)
Basophils Relative: 1.1 % (ref 0.0–3.0)
Eosinophils Absolute: 0.1 10*3/uL (ref 0.0–0.7)
Eosinophils Relative: 2.5 % (ref 0.0–5.0)
HCT: 45.3 % (ref 36.0–46.0)
Hemoglobin: 15.6 g/dL — ABNORMAL HIGH (ref 12.0–15.0)
Lymphocytes Relative: 28.5 % (ref 12.0–46.0)
Lymphs Abs: 1.2 10*3/uL (ref 0.7–4.0)
MCHC: 34.3 g/dL (ref 30.0–36.0)
MCV: 93.4 fl (ref 78.0–100.0)
Monocytes Absolute: 0.3 10*3/uL (ref 0.1–1.0)
Monocytes Relative: 7.7 % (ref 3.0–12.0)
Neutro Abs: 2.6 10*3/uL (ref 1.4–7.7)
Neutrophils Relative %: 60.2 % (ref 43.0–77.0)
Platelets: 237 10*3/uL (ref 150.0–400.0)
RBC: 4.85 Mil/uL (ref 3.87–5.11)
RDW: 13 % (ref 11.5–15.5)
WBC: 4.4 10*3/uL (ref 4.0–10.5)

## 2016-11-21 LAB — COMPREHENSIVE METABOLIC PANEL
ALT: 27 U/L (ref 0–35)
AST: 25 U/L (ref 0–37)
Albumin: 4.4 g/dL (ref 3.5–5.2)
Alkaline Phosphatase: 60 U/L (ref 39–117)
BUN: 14 mg/dL (ref 6–23)
CO2: 30 mEq/L (ref 19–32)
Calcium: 9.7 mg/dL (ref 8.4–10.5)
Chloride: 99 mEq/L (ref 96–112)
Creatinine, Ser: 0.79 mg/dL (ref 0.40–1.20)
GFR: 76.6 mL/min (ref >60.00)
Glucose, Bld: 105 mg/dL — ABNORMAL HIGH (ref 70–99)
Potassium: 3.4 mEq/L — ABNORMAL LOW (ref 3.5–5.1)
Sodium: 139 mEq/L (ref 135–145)
Total Bilirubin: 0.7 mg/dL (ref 0.2–1.2)
Total Protein: 6.8 g/dL (ref 6.0–8.3)

## 2016-11-21 LAB — LIPID PANEL
Cholesterol: 199 mg/dL (ref 0–200)
HDL: 85.6 mg/dL (ref >39.00)
LDL Cholesterol: 102 mg/dL — ABNORMAL HIGH (ref 0–99)
NonHDL: 113.27
Total CHOL/HDL Ratio: 2
Triglycerides: 56 mg/dL (ref 0.0–149.0)
VLDL: 11.2 mg/dL (ref 0.0–40.0)

## 2016-11-21 LAB — TSH: TSH: 5.2 u[IU]/mL — ABNORMAL HIGH (ref 0.35–4.50)

## 2016-11-21 LAB — T4, FREE: Free T4: 0.83 ng/dL (ref 0.60–1.60)

## 2016-11-21 NOTE — Addendum Note (Signed)
Addended by: London Sheer T on: 11/21/2016 08:15 AM   Modules accepted: Orders

## 2016-11-21 NOTE — Progress Notes (Signed)
Subjective:    [name] is a 71 y.o. female and is here for a comprehensive physical exam.  Pertinent Gynecological History: No LMP recorded. Patient is postmenopausal.  Eating habits have improved. Decreased wine and bread.   Health Maintenance Due  Topic Date Due  . Hepatitis C Screening  Jul 31, 1945  . INFLUENZA VACCINE  09/14/2016   PMHx, SurgHx, SocialHx, Medications, and Allergies were reviewed in the Visit Navigator and updated as appropriate.   Past Medical History:  Diagnosis Date  . Arthritis   . Complication of anesthesia   . GERD (gastroesophageal reflux disease)   . High cholesterol   . Hypertension   . PONV (postoperative nausea and vomiting)    patch worked very well  . Seasonal allergies   . Vitamin D deficiency 03/06/2015    Past Surgical History:  Procedure Laterality Date  . BUNIONECTOMY Bilateral   . COLONOSCOPY    . HARDWARE REMOVAL Right 12/10/2015   Procedure: HARDWARE REMOVAL RIGHT ANKLE;  Surgeon: Myrene Galas, MD;  Location: [facility] OR;  Service: Orthopedics;  Laterality: Right;  . MYOMECTOMY    . ORIF TIBIA FRACTURE Right 01/29/2015   Procedure: OPEN REDUCTION INTERNAL FIXATION (ORIF) RIGHT TIBIA  AND FIBULA FRACTURES;  Surgeon: Myrene Galas, MD;  Location: [facility] OR;  Service: Orthopedics;  Laterality: Right;    Family History  Problem Relation Age of Onset  . CVA Mother   . Heart disease Father   . Diabetes Father   . Heart attack Sister    Social History  Substance Use Topics  . Smoking status: Never Smoker  . Smokeless tobacco: Never Used  . Alcohol use 0.0 oz/week    3 - 4 Glasses of wine per week   Review of Systems:   Pertinent items are noted in the HPI. Otherwise, ROS is negative.  Objective:   BP 134/78   Pulse (!) 59   Temp 97.8 F (36.6 C) (Oral)   Ht  (1.575 m)   Wt 146 lb 9.6 oz (66.5 kg)   SpO2 97%   BMI 26.81 kg/m   Wt Readings from Last 3 Encounters:  11/21/16 146 lb 9.6 oz (66.5 kg)  05/09/16  149 lb 3.2 oz (67.7 kg)  12/10/15 150 lb (68 kg)     Ht Readings from Last 3 Encounters:  11/21/16  (1.575 m)  05/09/16  (1.575 m)  12/10/15  (1.575 m)   General appearance: alert, cooperative and appears stated age. Head: normocephalic, without obvious abnormality, atraumatic. Neck: no adenopathy, supple, symmetrical, trachea midline; thyroid not enlarged, symmetric, no tenderness/mass/nodules. Lungs: clear to auscultation bilaterally. Heart: regular rate and rhythm Abdomen: soft, non-tender; no masses,  no organomegaly. Extremities: extremities normal, atraumatic, no cyanosis or edema. Skin: skin color, texture, turgor normal, no rashes or lesions. Lymph: cervical, supraclavicular, and axillary nodes normal; no abnormal inguinal nodes palpated. Neurologic: grossly normal.  Assessment/Plan:   [name] was seen today for annual exam.  Diagnoses and all orders for this visit:  Routine medical exam -     Comprehensive metabolic panel -     CBC with Differential/Platelet -     Lipid panel -     TSH -     T4, free  Screening for lipid disorders -     Lipid panel  Fatigue, unspecified type -     Comprehensive metabolic panel -     CBC with Differential/Platelet -     Lipid panel -  TSH -     T4, free  Need for hepatitis C screening test -     Hepatitis C antibody, reflex   Patient Counseling:    Nutrition: Stressed importance of moderation in sodium/caffeine intake, saturated fat and cholesterol, caloric balance, sufficient intake of fresh fruits, vegetables, fiber, calcium, iron, and 1 mg of folate supplement per day (for females capable of pregnancy).     Stressed the importance of regular exercise.      Substance Abuse: Discussed cessation/primary prevention of tobacco, alcohol, or other drug use; driving or other dangerous activities under the influence; availability of treatment for abuse.      Injury prevention: Discussed safety belts,  safety helmets, smoke detector, smoking near bedding or upholstery.      Sexuality: Discussed sexually transmitted diseases, partner selection, use of condoms, avoidance of unintended pregnancy  and contraceptive alternatives.     Dental health: Discussed importance of regular tooth brushing, flossing, and dental visits.     Health maintenance and immunizations reviewed. Please refer to Health maintenance section.   Helane Rima, DO [facility]

## 2016-11-22 ENCOUNTER — Ambulatory Visit
Admission: RE | Admit: 2016-11-22 | Discharge: 2016-11-22 | Disposition: A | Discharge status: Home / Self Care | Payer: Medicare Other | Source: Ambulatory Visit | Attending: Family Medicine | Admitting: Family Medicine

## 2016-11-22 DIAGNOSIS — Z1231 Encounter for screening mammogram for malignant neoplasm of breast: Secondary | ICD-10-CM

## 2016-11-22 LAB — HEPATITIS C ANTIBODY
Hepatitis C Ab: NONREACTIVE
SIGNAL TO CUT-OFF: 0.01 (ref <1.00)

## 2016-11-27 ENCOUNTER — Encounter: Payer: Self-pay | Admitting: Family Medicine

## 2016-11-27 DIAGNOSIS — E039 Hypothyroidism, unspecified: Secondary | ICD-10-CM | POA: Insufficient documentation

## 2016-11-27 DIAGNOSIS — E038 Other specified hypothyroidism: Secondary | ICD-10-CM | POA: Insufficient documentation

## 2016-11-27 HISTORY — DX: Other specified hypothyroidism: E03.8

## 2016-11-29 ENCOUNTER — Ambulatory Visit (INDEPENDENT_AMBULATORY_CARE_PROVIDER_SITE_OTHER): Payer: Medicare Other | Admitting: Physician Assistant

## 2016-11-29 ENCOUNTER — Encounter: Payer: Self-pay | Admitting: Physician Assistant

## 2016-11-29 VITALS — BP 138/82 | HR 70 | Temp 98.4°F | Resp 16 | Wt 146.4 lb

## 2016-11-29 DIAGNOSIS — J029 Acute pharyngitis, unspecified: Secondary | ICD-10-CM

## 2016-11-29 MED ORDER — AMOXICILLIN-POT CLAVULANATE 875-125 MG PO TABS: 1.0000 | ORAL_TABLET | Freq: Two times a day (BID) | ORAL | 0 refills | Status: DC

## 2016-11-29 NOTE — Patient Instructions (Addendum)
It was great to meet you!  Start oral antibiotic, Augmentin, take with food to prevent stomach upset.  Push fluids.  Alternate tylenol and ibuprofen, every 4-6 hours. You may take 500 mg Tylenol and 4-6 hours later take 400 mg Ibuprofen.  If you develop worsening throat pain, sudden increase in fever, shortness of breath --> please go to the emergency room.
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
## 2016-11-29 NOTE — Progress Notes (Deleted)
Lissy Deuser is a 71 y.o. female here for a new problem.  I acted as a Neurosurgeon for [employer], PA-C [employer], LPN   History of Present Illness:   No chief complaint on file.   Sore Throat   This is a new problem.    Past Medical History:  Diagnosis Date  . Arthritis   . Complication of anesthesia   . GERD (gastroesophageal reflux disease)   . High cholesterol   . Hypertension   . PONV (postoperative nausea and vomiting)    patch worked very well  . Seasonal allergies   . Vitamin D deficiency 03/06/2015     Social History   Social History  . Marital status: Married    Spouse name: N/A  . Number of children: N/A  . Years of education: N/A   Occupational History  . Not on file.   Social History Main Topics  . Smoking status: Never Smoker  . Smokeless tobacco: Never Used  . Alcohol use 0.0 oz/week    3 - 4 Glasses of wine per week  . Drug use: No  . Sexual activity: Yes   Other Topics Concern  . Not on file   Social History Narrative  . No narrative on file    Past Surgical History:  Procedure Laterality Date  . BUNIONECTOMY Bilateral   . COLONOSCOPY    . HARDWARE REMOVAL Right 12/10/2015   Procedure: HARDWARE REMOVAL RIGHT ANKLE;  Surgeon: Myrene Galas, MD;  Location: [facility] OR;  Service: Orthopedics;  Laterality: Right;  . MYOMECTOMY    . ORIF TIBIA FRACTURE Right 01/29/2015   Procedure: OPEN REDUCTION INTERNAL FIXATION (ORIF) RIGHT TIBIA  AND FIBULA FRACTURES;  Surgeon: Myrene Galas, MD;  Location: [facility] OR;  Service: Orthopedics;  Laterality: Right;    Family History  Problem Relation Age of Onset  . CVA Mother   . Heart disease Father   . Diabetes Father   . Heart attack Sister     No Known Allergies  Current Medications:   Current Outpatient Prescriptions:  .  aspirin 81 MG tablet, Take 81 mg by mouth daily., Disp: , Rfl:  .  co-enzyme Q-10 30 MG capsule, Take 30 mg by mouth daily., Disp: , Rfl:  .  diphenhydrAMINE (BENADRYL)  25 MG tablet, Take 25 mg by mouth as needed for allergies. , Disp: , Rfl:  .  fexofenadine (ALLEGRA) 180 MG tablet, Take 180 mg by mouth daily as needed for allergies or rhinitis., Disp: , Rfl:  .  fluticasone (FLONASE) 50 MCG/ACT nasal spray, Place 2 sprays into both nostrils as needed for allergies or rhinitis., Disp: , Rfl:  .  hydrochlorothiazide (HYDRODIURIL) 25 MG tablet, Take 25 mg by mouth daily., Disp: , Rfl:  .  Ketotifen Fumarate (ALLERGY EYE DROPS OP), Apply 1 drop to eye daily as needed (allergies)., Disp: , Rfl:  .  simvastatin (ZOCOR) 40 MG tablet, Take 40 mg by mouth every evening. , Disp: , Rfl:  .  vitamin C (VITAMIN C) 500 MG tablet, Take 1 tablet (500 mg total) by mouth daily., Disp: 60 tablet, Rfl: 2   Review of Systems:   ROS  Vitals:   There were no vitals filed for this visit.   There is no height or weight on file to calculate BMI.  Physical Exam:   Physical Exam  Assessment and Plan:    There are no diagnoses linked to this encounter.  . Reviewed expectations re: course of current  medical issues. . Discussed self-management of symptoms. . Outlined signs and symptoms indicating need for more acute intervention. . Patient verbalized understanding and all questions were answered. . See orders for this visit as documented in the electronic medical record. . Patient received an After-Visit Summary.  CMA or LPN served as scribe during this visit. History, Physical, and Plan performed by medical provider. Documentation and orders reviewed and attested to.  Inda Coke, PA-C

## 2017-02-27 NOTE — Progress Notes (Signed)
Subjective:   [name] is a 72 y.o. female who presents for Medicare Annual (Subsequent) preventive examination.  Lives in two story single family home with husband.   Review of Systems:  No ROS.  Medicare Wellness Visit. Additional risk factors are reflected in the social history.  Cardiac Risk Factors include: advanced age (>7555men, 37>65 women);dyslipidemia;hypertension     Objective:     Vitals: BP 132/82 (BP Location: Left Arm, Patient Position: Sitting, Cuff Size: Normal)   Pulse (!) 54   Resp 16   Ht 5\' 2"  (1.575 m)   Wt 146 lb 9.6 oz (66.5 kg)   SpO2 98%   BMI 26.81 kg/m   Body mass index is 26.81 kg/m.  Advanced Directives 02/28/2017 12/10/2015 01/27/2015  Does Patient Have a Medical Advance Directive? Yes Yes No  Type of Advance Directive Living will;Healthcare Power of [employer] Healthcare Power of [place];Living will -  Does patient want to make changes to medical advance directive? No - Patient declined No - Patient declined -  Copy of Healthcare Power of Attorney in Chart? No - copy requested No - copy requested -    Tobacco Social History   Tobacco Use  Smoking Status Never Smoker  Smokeless Tobacco Never Used     Counseling given: Not Answered  Past Medical History:  Diagnosis Date  . Arthritis   . Complication of anesthesia   . GERD (gastroesophageal reflux disease)   . High cholesterol   . Hypertension   . PONV (postoperative nausea and vomiting)    patch worked very well  . Seasonal allergies   . Vitamin D deficiency 03/06/2015   Past Surgical History:  Procedure Laterality Date  . BUNIONECTOMY Bilateral   . COLONOSCOPY    . HARDWARE REMOVAL Right 12/10/2015   Procedure: HARDWARE REMOVAL RIGHT ANKLE;  Surgeon: Myrene GalasMichael Handy, MD;  [facility];  Service: Orthopedics;  Laterality: Right;  . MYOMECTOMY    . ORIF TIBIA FRACTURE Right 01/29/2015   Procedure: OPEN REDUCTION INTERNAL FIXATION (ORIF) RIGHT TIBIA  AND FIBULA FRACTURES;   Surgeon: Myrene GalasMichael Handy, MD;  [facility];  Service: Orthopedics;  Laterality: Right;   Family History  Problem Relation Age of Onset  . CVA Mother   . Heart disease Father   . Diabetes Father   . Heart attack Sister    Social History   Socioeconomic History  . Marital status: Married    Spouse name: None  . Number of children: None  . Years of education: None  . Highest education level: None  Social Needs  . Financial resource strain: None  . Food insecurity - worry: None  . Food insecurity - inability: None  . Transportation needs - medical: None  . Transportation needs - non-medical: None  Occupational History  . Occupation: retired    Comment: Runner, broadcasting/film/videoteacher   Tobacco Use  . Smoking status: Never Smoker  . Smokeless tobacco: Never Used  Substance and Sexual Activity  . Alcohol use: Yes    Alcohol/week: 0.0 oz    Types: 3 - 4 Glasses of wine per week  . Drug use: No  . Sexual activity: Yes  Other Topics Concern  . None  Social History Narrative  . None    Outpatient Encounter Medications as of 02/28/2017  Medication Sig  . aspirin 81 MG tablet Take 81 mg by mouth daily.  . Cholecalciferol (VITAMIN D) 2000 units CAPS Take by mouth daily.  Marland Kitchen. co-enzyme Q-10 30 MG capsule  Take 30 mg by mouth daily.  . diphenhydrAMINE (BENADRYL) 25 MG tablet Take 25 mg by mouth as needed for allergies.   . fexofenadine (ALLEGRA) 180 MG tablet Take 180 mg by mouth daily as needed for allergies or rhinitis.  . fluticasone (FLONASE) 50 MCG/ACT nasal spray Place 2 sprays into both nostrils as needed for allergies or rhinitis.  . hydrochlorothiazide (HYDRODIURIL) 25 MG tablet Take 25 mg by mouth daily.  Marland Kitchen Ketotifen Fumarate (ALLERGY EYE DROPS OP) Apply 1 drop to eye daily as needed (allergies).  . Omega-3 Fatty Acids (FISH OIL) 1000 MG CAPS Take by mouth daily.  . simvastatin (ZOCOR) 40 MG tablet Take 40 mg by mouth every evening.   . vitamin C (VITAMIN C) 500 MG tablet Take 1 tablet (500 mg  total) by mouth daily.  . [DISCONTINUED] amoxicillin-clavulanate (AUGMENTIN) 875-125 MG tablet Take 1 tablet by mouth 2 (two) times daily. (Patient not taking: Reported on 02/28/2017)   No facility-administered encounter medications on file as of 02/28/2017.     Activities of Daily Living In your present state of health, do you have any difficulty performing the following activities: 02/28/2017  Hearing? N  Vision? N  Difficulty concentrating or making decisions? N  Walking or climbing stairs? N  Dressing or bathing? N  Doing errands, shopping? N  Preparing Food and eating ? N  Using the Toilet? N  In the past six months, have you accidently leaked urine? Y  Comment Pressure incontinence if bladder is too full.   Do you have problems with loss of bowel control? N  Managing your Medications? N  Managing your Finances? N  Housekeeping or managing your Housekeeping? N  Some recent data might be hidden    Patient Care Team: Helane Rima, DO as PCP - General (Family Medicine)    Assessment:   This is a routine wellness examination for [name].  Exercise Activities and Dietary recommendations Current Exercise Habits: Home exercise routine, Type of exercise: walking, Time (Minutes): 60, Frequency (Times/Week): 5, Weekly Exercise (Minutes/Week): 300, Intensity: Moderate, Exercise limited by: None identified  Patient states she eats a somewhat healthy diet. States she eats fruits and vegetables but also enjoys carbs and sweets. Her husband cooks the majority of their meals. Drinks water constantly. 2 cups coffee. Occ beer and wine. Occ decaf tea.   Goals    . Eat better     Decrease carbs and starches.        Fall Risk Fall Risk  02/28/2017 05/09/2016  Falls in the past year? No No   Depression Screen PHQ 2/9 Scores 02/28/2017 05/09/2016  PHQ - 2 Score 0 0  PHQ- 9 Score 0 -  PHQ2 and PHQ9 completed. No signs or symptoms of depression. Total time spent on topic was 13 minutes.    Cognitive Function Ad8 score reviewed for issues:  Issues making decisions: no  Less interest in hobbies / activities:no  Repeats questions, stories (family complaining):no  Trouble using ordinary gadgets (microwave, computer, phone):no  Forgets the month or year: no  Mismanaging finances: no  Remembering appts:no  Daily problems with thinking and/or memory:no Ad8 score is=0 Patient does play Solitaire and cross word puzzles.         Immunization History  Administered Date(s) Administered  . Influenza, High Dose Seasonal PF 11/21/2016  . Influenza,inj,Quad PF,6+ Mos 01/05/2016  . Pneumococcal Conjugate-13 12/26/2014  . Pneumococcal Polysaccharide-23 12/28/2012  . Tdap 11/20/2013   Screening Tests Health Maintenance  Topic Date Due  .  MAMMOGRAM  11/23/2018  . TETANUS/TDAP  11/21/2023  . COLONOSCOPY  05/21/2026  . INFLUENZA VACCINE  Completed  . DEXA SCAN  Completed  . Hepatitis C Screening  Completed  . PNA vac Low Risk Adult  Completed      Plan:   Follow up with PCP as directed.  I have personally reviewed and noted the following in the patient's chart:   . Medical and social history . Use of alcohol, tobacco or illicit drugs  . Current medications and supplements . Functional ability and status . Nutritional status . Physical activity . Advanced directives . List of other physicians . Vitals . Screenings to include cognitive, depression, and falls . Referrals and appointments  In addition, I have reviewed and discussed with patient certain preventive protocols, quality metrics, and best practice recommendations. A written personalized care plan for preventive services as well as general preventive health recommendations were provided to patient.     Sherrye Payor, RN  02/28/2017

## 2017-02-27 NOTE — Progress Notes (Signed)
Pre visit review using our clinic review tool, if applicable. No additional management support is needed unless otherwise documented below in the visit note. 

## 2017-02-27 NOTE — Progress Notes (Signed)
PCP notes:   Health maintenance: NA.   Abnormal screenings: None.   Patient concerns: None.   Nurse concerns: None.   Next PCP appt: 11/20/2017

## 2017-02-28 ENCOUNTER — Ambulatory Visit (INDEPENDENT_AMBULATORY_CARE_PROVIDER_SITE_OTHER): Payer: Medicare Other | Admitting: *Deleted

## 2017-02-28 ENCOUNTER — Encounter: Payer: Self-pay | Admitting: *Deleted

## 2017-02-28 VITALS — BP 132/82 | HR 54 | Resp 16 | Ht 62.0 in | Wt 146.6 lb

## 2017-02-28 DIAGNOSIS — Z0000 Encounter for general adult medical examination without abnormal findings: Secondary | ICD-10-CM

## 2017-02-28 DIAGNOSIS — Z1331 Encounter for screening for depression: Secondary | ICD-10-CM

## 2017-02-28 NOTE — Patient Instructions (Addendum)
Ms. Derrek GuRau ,  Bring a copy of your living will and/or healthcare power of attorney to your next office visit.  Thank you for taking time to come for your Medicare Wellness Visit. I appreciate your ongoing commitment to your health goals. Please review the following plan we discussed and let me know if I can assist you in the future.   These are the goals we discussed: Goals    . Eat better     Decrease carbs and starches.        This is a list of the screening recommended for you and due dates:  Health Maintenance  Topic Date Due  . Mammogram  11/23/2018  . Tetanus Vaccine  11/21/2023  . Colon Cancer Screening  05/21/2026  . Flu Shot  Completed  . DEXA scan (bone density measurement)  Completed  .  Hepatitis C: One time screening is recommended by Center for Disease Control  (CDC) for  adults born from [birth year] through 1963.   Completed  . Pneumonia vaccines  Completed   Preventive Care for Adults  A healthy lifestyle and preventive care can promote health and wellness. Preventive health guidelines for adults include the following key practices.  . A routine yearly physical is a good way to check with your health care provider about your health and preventive screening. It is a chance to share any concerns and updates on your health and to receive a thorough exam.  . Visit your dentist for a routine exam and preventive care every 6 months. Brush your teeth twice a day and floss once a day. Good oral hygiene prevents tooth decay and gum disease.  . The frequency of eye exams is based on your age, health, family medical history, use  of contact lenses, and other factors. Follow your health care provider's recommendations for frequency of eye exams.  . Eat a healthy diet. Foods like vegetables, fruits, whole grains, low-fat dairy products, and lean protein foods contain the nutrients you need without too many calories. Decrease your intake of foods high in solid fats, added sugars, and  salt. Eat the right amount of calories for you. Get information about a proper diet from your health care provider, if necessary.  . Regular physical exercise is one of the most important things you can do for your health. Most adults should get at least 150 minutes of moderate-intensity exercise (any activity that increases your heart rate and causes you to sweat) each week. In addition, most adults need muscle-strengthening exercises on 2 or more days a week.  Silver Sneakers may be a benefit available to you. To determine eligibility, you may visit the website: www.silversneakers.com or contact program at [phone])[phone] Mon-Fri between 8AM-8PM.   . Maintain a healthy weight. The body mass index (BMI) is a screening tool to identify possible weight problems. It provides an estimate of body fat based on height and weight. Your health care provider can find your BMI and can help you achieve or maintain a healthy weight.   For adults 20 years and older: ? A BMI below 18.5 is considered underweight. ? A BMI of 18.5 to 24.9 is normal. ? A BMI of 25 to 29.9 is considered overweight. ? A BMI of 30 and above is considered obese.   . Maintain normal blood lipids and cholesterol levels by exercising and minimizing your intake of saturated fat. Eat a balanced diet with plenty of fruit and vegetables. Blood tests for lipids and cholesterol should begin at  age 71 and be repeated every 5 years. If your lipid or cholesterol levels are high, you are over 50, or you are at high risk for heart disease, you may need your cholesterol levels checked more frequently. Ongoing high lipid and cholesterol levels should be treated with medicines if diet and exercise are not working.  . If you smoke, find out from your health care provider how to quit. If you do not use tobacco, please do not start.  . If you choose to drink alcohol, please do not consume more than 2 drinks per day. One drink is considered to be 12 ounces  (355 mL) of beer, 5 ounces (148 mL) of wine, or 1.5 ounces (44 mL) of liquor.  . If you are 38-58 years old, ask your health care provider if you should take aspirin to prevent strokes.  . Use sunscreen. Apply sunscreen liberally and repeatedly throughout the day. You should seek shade when your shadow is shorter than you. Protect yourself by wearing long sleeves, pants, a wide-brimmed hat, and sunglasses year round, whenever you are outdoors.  . Once a month, do a whole body skin exam, using a mirror to look at the skin on your back. Tell your health care provider of new moles, moles that have irregular borders, moles that are larger than a pencil eraser, or moles that have changed in shape or color.

## 2017-03-01 NOTE — Progress Notes (Signed)
I have personally reviewed the Medicare Annual Wellness questionnaire and have noted 1. The patient's medical and social history 2. Their use of alcohol, tobacco or illicit drugs 3. Their current medications and supplements 4. The patient's functional ability including ADL's, fall risks, home safety risks and hearing or visual impairment. 5. Diet and physical activities 6. Evidence for depression or mood disorders 7. Reviewed Updated provider list, see scanned forms and [facility] Snapshot.   The patients weight, height, BMI and visual acuity have been recorded in the chart I have made referrals, counseling and provided education to the patient based review of the above and I have provided the pt with a written personalized care plan for preventive services.  I have provided the patient with a copy of your personalized plan for preventive services. Instructed to take the time to review along with their updated medication list.  Merly Hinkson  

## 2017-09-06 ENCOUNTER — Telehealth: Payer: Self-pay | Admitting: *Deleted

## 2017-09-06 NOTE — Telephone Encounter (Signed)
Pt called back, asked her about her dizziness. Pt said she has been having it off and on couple of weeks. Head feels like it is in a fog at times. Happens first thing in the morning sometimes and also had an episode when she came home from the store she was really dizzy and had her husband get her some water and something to eat and then she felt better. Pt said she took her blood pressure last week and got some high readings but not sure if cuff is working correctly. Asked pt if having any headaches? Pt said no, thinks having some sinus issues today and used her Flonase. Told pt I would like for her to come in this week instead of waiting till Monday. Pt verbalized understanding. Asked pt when can she come in? Pt said Friday. Told her Dr. Earlene PlaterWallace has 7:00 AM or 3:00 and 3:20 PM. Pt said 7:00 AM appointment would be good. Told her okay appt scheduled. Pt verbalized understanding. Asked pt if she is having any SOB? Pt said no. Told her okay see you Friday.

## 2017-09-06 NOTE — Telephone Encounter (Signed)
Left message on personal voicemail calling about your dizziness and you made an appointment on Monday. Calling to see if we should see you sooner. Please call office.

## 2017-09-07 NOTE — Progress Notes (Signed)
Ovidio HangerMartha Anita [name] is a 72 y.o. female here for an acute visit.  History of Present Illness:   [name], CMA acting as scribe for Dr. Helane RimaErica Jalexa Pifer.   HPI: Patient in office for follow up. She has had "floating" feeling in her head that has been going on for about three weeks. She has had a fullness sensation in her head but no head ache. The floating sensation comes and goes at random times. She has not noticed anything that may cause such as movements, time between meals. Symptoms only last a few minutes and come and go. The worst day was Wednesday.   Review of Systems  Constitutional: Negative for chills, fever, malaise/fatigue and weight loss.  HENT: Negative for congestion, ear discharge, ear pain, hearing loss, sinus pain, sore throat and tinnitus.   Respiratory: Negative for cough, shortness of breath and wheezing.   Cardiovascular: Negative for chest pain, palpitations and leg swelling.  Gastrointestinal: Negative for abdominal pain, constipation, diarrhea, nausea and vomiting.  Genitourinary: Negative for dysuria and urgency.  Musculoskeletal: Negative for joint pain and myalgias.  Skin: Negative for rash.  Neurological: Positive for dizziness. Negative for tingling, tremors, sensory change, speech change, focal weakness, seizures, loss of consciousness, weakness and headaches.  Psychiatric/Behavioral: Negative for depression, substance abuse and suicidal ideas. The patient is not nervous/anxious.     PMHx, SurgHx, SocialHx, Medications, and Allergies were reviewed in the Visit Navigator and updated as appropriate.  Current Medications:   Current Outpatient Medications:  .  aspirin 81 MG tablet, Take 81 mg by mouth daily., Disp: , Rfl:  .  Cholecalciferol (VITAMIN D) 2000 units CAPS, Take by mouth daily., Disp: , Rfl:  .  co-enzyme Q-10 30 MG capsule, Take 30 mg by mouth daily., Disp: , Rfl:  .  diphenhydrAMINE (BENADRYL) 25 MG tablet, Take 25 mg by mouth as needed for  allergies. , Disp: , Rfl:  .  fexofenadine (ALLEGRA) 180 MG tablet, Take 180 mg by mouth daily as needed for allergies or rhinitis., Disp: , Rfl:  .  fluticasone (FLONASE) 50 MCG/ACT nasal spray, Place 2 sprays into both nostrils as needed for allergies or rhinitis., Disp: , Rfl:  .  hydrochlorothiazide (HYDRODIURIL) 25 MG tablet, Take 25 mg by mouth daily., Disp: , Rfl:  .  Ketotifen Fumarate (ALLERGY EYE DROPS OP), Apply 1 drop to eye daily as needed (allergies)., Disp: , Rfl:  .  Omega-3 Fatty Acids (FISH OIL) 1000 MG CAPS, Take by mouth daily., Disp: , Rfl:  .  simvastatin (ZOCOR) 40 MG tablet, Take 40 mg by mouth every evening. , Disp: , Rfl:  .  vitamin C (VITAMIN C) 500 MG tablet, Take 1 tablet (500 mg total) by mouth daily., Disp: 60 tablet, Rfl: 2   No Known Allergies   Review of Systems:   Pertinent items are noted in the HPI. Otherwise, ROS is negative.  Vitals:   Vitals:   09/08/17 0700  BP: 140/76  Pulse: 63  Temp: 98.3 F (36.8 C)  TempSrc: Oral  SpO2: 96%  Weight: 156 lb 9.6 oz (71 kg)  Height: 5\' 2"  (1.575 m)     Body mass index is 28.64 kg/m.  Physical Exam:   Physical Exam  Constitutional: She appears well-nourished.  HENT:  Head: Normocephalic and atraumatic.  Eyes: Pupils are equal, round, and reactive to light. EOM are normal.  Neck: Normal range of motion. Neck supple.  Cardiovascular: Normal rate, regular rhythm, normal heart sounds and intact  distal pulses.  Pulmonary/Chest: Effort normal.  Abdominal: Soft.  Skin: Skin is warm.  Psychiatric: She has a normal mood and affect. Her behavior is normal.  Nursing note and vitals reviewed.  EKG: sinus bradycardia.   Assessment and Plan:   Laurieanne was seen today for dizziness.  Diagnoses and all orders for this visit:  Dizziness -     CBC with Differential/Platelet -     Comprehensive metabolic panel -     Urinalysis, Routine w reflex microscopic -     TSH -     Vitamin B12 -     Thyroid  peroxidase antibody -     EKG 12-Lead -     Lipid panel -     Hemoglobin A1c    . Reviewed expectations re: course of current medical issues. . Discussed self-management of symptoms. . Outlined signs and symptoms indicating need for more acute intervention. . Patient verbalized understanding and all questions were answered. Marland Kitchen Health Maintenance issues including appropriate healthy diet, exercise, and smoking avoidance were discussed with patient. . See orders for this visit as documented in the electronic medical record. . Patient received an After Visit Summary.   Helane Rima, DO [place] 09/09/2017   CMA served as Neurosurgeon during this visit. History, Physical, and Plan performed by medical provider. The above documentation has been reviewed and is accurate and complete. Helane Rima, D.O.

## 2017-09-08 ENCOUNTER — Ambulatory Visit: Payer: Medicare Other | Admitting: Family Medicine

## 2017-09-08 ENCOUNTER — Encounter: Payer: Self-pay | Admitting: Family Medicine

## 2017-09-08 VITALS — BP 140/76 | HR 63 | Temp 98.3°F | Ht 62.0 in | Wt 156.6 lb

## 2017-09-08 DIAGNOSIS — R42 Dizziness and giddiness: Secondary | ICD-10-CM | POA: Diagnosis not present

## 2017-09-08 LAB — LIPID PANEL
Cholesterol: 202 mg/dL — ABNORMAL HIGH (ref 0–200)
HDL: 79.6 mg/dL (ref >39.00)
LDL Cholesterol: 111 mg/dL — ABNORMAL HIGH (ref 0–99)
NonHDL: 122.85
Total CHOL/HDL Ratio: 3
Triglycerides: 60 mg/dL (ref 0.0–149.0)
VLDL: 12 mg/dL (ref 0.0–40.0)

## 2017-09-08 LAB — COMPREHENSIVE METABOLIC PANEL
ALT: 21 U/L (ref 0–35)
AST: 21 U/L (ref 0–37)
Albumin: 4.2 g/dL (ref 3.5–5.2)
Alkaline Phosphatase: 53 U/L (ref 39–117)
BUN: 15 mg/dL (ref 6–23)
CO2: 30 mEq/L (ref 19–32)
Calcium: 9.3 mg/dL (ref 8.4–10.5)
Chloride: 100 mEq/L (ref 96–112)
Creatinine, Ser: 0.85 mg/dL (ref 0.40–1.20)
GFR: 70.23 mL/min (ref >60.00)
Glucose, Bld: 103 mg/dL — ABNORMAL HIGH (ref 70–99)
Potassium: 3.7 mEq/L (ref 3.5–5.1)
Sodium: 139 mEq/L (ref 135–145)
Total Bilirubin: 0.5 mg/dL (ref 0.2–1.2)
Total Protein: 6.6 g/dL (ref 6.0–8.3)

## 2017-09-08 LAB — CBC WITH DIFFERENTIAL/PLATELET
Basophils Absolute: 0 10*3/uL (ref 0.0–0.1)
Basophils Relative: 0.9 % (ref 0.0–3.0)
Eosinophils Absolute: 0.1 10*3/uL (ref 0.0–0.7)
Eosinophils Relative: 2.8 % (ref 0.0–5.0)
HCT: 43.8 % (ref 36.0–46.0)
Hemoglobin: 15.5 g/dL — ABNORMAL HIGH (ref 12.0–15.0)
Lymphocytes Relative: 27.6 % (ref 12.0–46.0)
Lymphs Abs: 1.2 10*3/uL (ref 0.7–4.0)
MCHC: 35.3 g/dL (ref 30.0–36.0)
MCV: 91.4 fl (ref 78.0–100.0)
Monocytes Absolute: 0.3 10*3/uL (ref 0.1–1.0)
Monocytes Relative: 6.9 % (ref 3.0–12.0)
Neutro Abs: 2.7 10*3/uL (ref 1.4–7.7)
Neutrophils Relative %: 61.8 % (ref 43.0–77.0)
Platelets: 240 10*3/uL (ref 150.0–400.0)
RBC: 4.79 Mil/uL (ref 3.87–5.11)
RDW: 13.3 % (ref 11.5–15.5)
WBC: 4.4 10*3/uL (ref 4.0–10.5)

## 2017-09-08 LAB — URINALYSIS, ROUTINE W REFLEX MICROSCOPIC
Bilirubin Urine: NEGATIVE
Hgb urine dipstick: NEGATIVE
Ketones, ur: NEGATIVE
Leukocytes, UA: NEGATIVE
Nitrite: NEGATIVE
RBC / HPF: NONE SEEN (ref 0–?)
Specific Gravity, Urine: <=1.005 — AB (ref 1.000–1.030)
Total Protein, Urine: NEGATIVE
Urine Glucose: NEGATIVE
Urobilinogen, UA: 0.2 (ref 0.0–1.0)
WBC, UA: NONE SEEN (ref 0–?)
pH: 7 (ref 5.0–8.0)

## 2017-09-08 LAB — HEMOGLOBIN A1C: Hgb A1c MFr Bld: 5.8 % (ref 4.6–6.5)

## 2017-09-08 LAB — VITAMIN B12: Vitamin B-12: 312 pg/mL (ref 211–911)

## 2017-09-08 LAB — TSH: TSH: 2.72 u[IU]/mL (ref 0.35–4.50)

## 2017-09-11 ENCOUNTER — Ambulatory Visit: Payer: Medicare Other | Admitting: Physician Assistant

## 2017-09-11 LAB — THYROID PEROXIDASE ANTIBODY: Thyroperoxidase Ab SerPl-aCnc: 18 IU/mL — ABNORMAL HIGH (ref <9)

## 2017-09-14 ENCOUNTER — Other Ambulatory Visit: Payer: Self-pay

## 2017-09-14 DIAGNOSIS — R42 Dizziness and giddiness: Secondary | ICD-10-CM

## 2017-10-06 ENCOUNTER — Telehealth: Payer: Self-pay | Admitting: Surgical

## 2017-10-06 DIAGNOSIS — E063 Autoimmune thyroiditis: Secondary | ICD-10-CM

## 2017-10-06 NOTE — Telephone Encounter (Signed)
Please advise. ENT referral has been placed.

## 2017-10-06 NOTE — Telephone Encounter (Signed)
Elevated thyroid peroxidase antibody means that she has Hashimoto's thyroiditis. Can send to Endocrinologist. Okay referral if she wants it.

## 2017-10-06 NOTE — Telephone Encounter (Signed)
I have spoke with patient and she is wanting the referral to Endocrinology. I have placed the referral.

## 2017-10-06 NOTE — Telephone Encounter (Signed)
Copied from CRM [id number]. Topic: Inquiry >> Oct 06, 2017  8:43 AM Crist InfanteHarrald, Kathy J wrote: Reason for CRM:  pt would like to know what the dr is going to do concerning her thyroid issue. Pt states this was never addressed.  Pt states when she rolls over in bed, she gets dizzy.  Also when she bent over to comb her hair, she got dizzy. The dizzy is not constant, but she still plans to make appt with the ENT. (Has not seen yet)

## 2017-11-17 ENCOUNTER — Encounter: Payer: Self-pay | Admitting: Internal Medicine

## 2017-11-17 ENCOUNTER — Ambulatory Visit: Payer: Medicare Other | Admitting: Internal Medicine

## 2017-11-17 VITALS — BP 130/70 | HR 69 | Ht 62.0 in | Wt 149.0 lb

## 2017-11-17 DIAGNOSIS — Z23 Encounter for immunization: Secondary | ICD-10-CM

## 2017-11-17 DIAGNOSIS — E063 Autoimmune thyroiditis: Secondary | ICD-10-CM

## 2017-11-17 DIAGNOSIS — Z8639 Personal history of other endocrine, nutritional and metabolic disease: Secondary | ICD-10-CM | POA: Insufficient documentation

## 2017-11-17 HISTORY — DX: Autoimmune thyroiditis: E06.3

## 2017-11-17 LAB — TSH: TSH: 2.17 u[IU]/mL (ref 0.35–4.50)

## 2017-11-17 LAB — T3, FREE: T3, Free: 3.2 pg/mL (ref 2.3–4.2)

## 2017-11-17 LAB — T4, FREE: FREE T4: 0.87 ng/dL (ref 0.60–1.60)

## 2017-11-17 NOTE — Patient Instructions (Signed)
Please stop at the lab.  (You can try Selenium 200 mcg daily).  Please come back for a follow-up appointment in 1 year.

## 2017-11-17 NOTE — Progress Notes (Signed)
Patient ID: [name], female   DOB: 1945-07-03, 72 y.o.   MRN: [id number]    HPI  [name] is a 72 y.o.-year-old female, referred by her PCP, Dr. Earlene Plater, for management of euthyroid Hashimoto's thyroiditis.  Pt went to see her PCP for dizziness, "whoozy" >> TPO antibodies high. She was referred to ENT >> viral labyrinthitis. Sxs improved.  She remembers in college taking Synthroid for few years >> then stopped and TFTs remained normal.  Patient had one slightly elevated TSH in 11/2016, but subsequent TSH obtained in 08/2017 was normal.  I reviewed pt's thyroid tests: Lab Results  Component Value Date   TSH 2.72 09/08/2017   TSH 5.20 (H) 11/21/2016   TSH 3.813 01/29/2015   FREET4 0.83 11/21/2016   Antithyroid antibodies were found to be elevated: Component     Latest Ref Rng & Units 09/08/2017  Thyroperoxidase Ab SerPl-aCnc     <9 IU/mL 18 (H)   No results found for: THGAB No components found for: TPOAB  Pt describes: - weight gain - fatigue - heat intolerance (hot flashes) - Palpitations  But denies - depression or anxiety - constipation - dry skin - hair loss  Pt denies feeling nodules in neck, hoarseness, dysphagia/odynophagia, SOB with lying down.  She has no FH of thyroid disorders. No FH of thyroid cancer.  No h/o radiation tx to head or neck. No recent use of iodine supplements.  Pt. also has a history of HTN, HL.  ROS: Constitutional: + See HPI Eyes: no blurry vision, no xerophthalmia ENT: no sore throat, + see HPI + hypoacusis, + tinnitus Cardiovascular: no CP/SOB/+ palpitations/no leg swelling Respiratory: no cough/SOB Gastrointestinal: no N/V/D/C Musculoskeletal: no muscle/joint aches Skin: no rashes Neurological: no tremors/numbness/tingling/dizziness Psychiatric: no depression/anxiety + Low libido  Past Medical History:  Diagnosis Date  . Arthritis   . Complication of anesthesia   . GERD (gastroesophageal reflux disease)   .  High cholesterol   . Hypertension   . PONV (postoperative nausea and vomiting)    patch worked very well  . Seasonal allergies   . Vitamin D deficiency 03/06/2015   Past Surgical History:  Procedure Laterality Date  . BUNIONECTOMY Bilateral   . COLONOSCOPY    . HARDWARE REMOVAL Right 12/10/2015   Procedure: HARDWARE REMOVAL RIGHT ANKLE;  Surgeon: Myrene Galas, MD;  Location: [facility] OR;  Service: Orthopedics;  Laterality: Right;  . MYOMECTOMY    . ORIF TIBIA FRACTURE Right 01/29/2015   Procedure: OPEN REDUCTION INTERNAL FIXATION (ORIF) RIGHT TIBIA  AND FIBULA FRACTURES;  Surgeon: Myrene Galas, MD;  Location: [facility] OR;  Service: Orthopedics;  Laterality: Right;   Social History   Socioeconomic History  . Marital status: Married    Spouse name: Not on file  . Number of children:  1 son  . Years of education: Not on file  . Highest education level: Not on file  Occupational History  . Occupation: retired    Comment: Runner, broadcasting/film/video   Tobacco Use  . Smoking status: Never Smoker  . Smokeless tobacco: Never Used  Substance and Sexual Activity  . Alcohol use: Yes    Alcohol/week:  Twice a week, 1-2 drinks    Types:  Beer/wine  . Drug use: No  For exercise, she is walking 5-6 times a week Current Outpatient Medications on File Prior to Visit  Medication Sig Dispense Refill  . aspirin 81 MG tablet Take 81 mg by mouth daily.    . Cholecalciferol (VITAMIN D)  2000 units CAPS Take by mouth daily.    . fluticasone (FLONASE) 50 MCG/ACT nasal spray Place 2 sprays into both nostrils as needed for allergies or rhinitis.    . hydrochlorothiazide (HYDRODIURIL) 25 MG tablet Take 25 mg by mouth daily.    . Omega-3 Fatty Acids (FISH OIL) 1000 MG CAPS Take by mouth daily.    . simvastatin (ZOCOR) 40 MG tablet Take 40 mg by mouth every evening.     . vitamin C (VITAMIN C) 500 MG tablet Take 1 tablet (500 mg total) by mouth daily. 60 tablet 2  . diphenhydrAMINE (BENADRYL) 25 MG tablet Take 25 mg by mouth as  needed for allergies.     . fexofenadine (ALLEGRA) 180 MG tablet Take 180 mg by mouth daily as needed for allergies or rhinitis.    Marland Kitchen Ketotifen Fumarate (ALLERGY EYE DROPS OP) Apply 1 drop to eye daily as needed (allergies).     No current facility-administered medications on file prior to visit.    No Known Allergies Family History  Problem Relation Age of Onset  . CVA Mother   . Heart disease Father   . Diabetes Father   . Heart attack Sister   Sister with non-Hodgkin's lymphoma Mother with hypertension  PE: BP 130/70   Pulse 69   Ht 5\' 2"  (1.575 m) Comment: measured  Wt 149 lb (67.6 kg)   SpO2 97%   BMI 27.25 kg/m  Wt Readings from Last 3 Encounters:  11/17/17 149 lb (67.6 kg)  09/08/17 156 lb 9.6 oz (71 kg)  02/28/17 146 lb 9.6 oz (66.5 kg)   Constitutional: overweight, in NAD Eyes: PERRLA, EOMI, no exophthalmos ENT: moist mucous membranes, no thyromegaly, no cervical lymphadenopathy Cardiovascular: RRR, No MRG Respiratory: CTA B Gastrointestinal: abdomen soft, NT, ND, BS+ Musculoskeletal: no deformities, strength intact in all 4 Skin: moist, warm, no rashes Neurological: no tremor with outstretched hands, DTR normal in all 4  ASSESSMENT: 1.  Euthyroid Hashimoto thyroiditis  PLAN: 1. Hashimoto thyroiditis - I reviewed the previous thyroid tests and also the TPO antibodies obtained by PCP: TSH has been elevated at the end of last year but this normalized at the last check in 08/2017.  However, 107/2019, her TPO antibodies returned slightly elevated, confirming Hashimoto's thyroiditis - we had a long discussion about her Hashimoto thyroiditis diagnosis. I explained that this is an autoimmune disorder, in which she develops antibodies against her own thyroid. The antibodies bind to the thyroid tissue and cause inflammation, and, eventually, destruction of the gland and hypothyroidism. We don't know how long this process can be, it can last from months to years. As of  now, based on the last results that I have, her thyroid tests are normal. We will repeat them today, however. I will also add thyroid antibody levels. - I also explained that thyroid enlargement especially at the beginning of her Hashimoto thyroiditis course is not uncommon, and it has a waxing and waning character.  - We discussed about treatment for Hashimoto thyroiditis, which is actually limited to thyroid hormones in case her TFTs are abnormal. Supplements like selenium has been tried with various results, some showing improvement in the TPO antibodies. However, there are no randomized controlled trials of this are consistent results between trials. We also discussed about ways to improve her immune system (relaxation, diet, exercise, sleep) to reduce the Ab titer and, subsequently, the thyroid inflammation. - We decided to check thyroid tests now and have her return in 6  months for repeat.  - I advised pt to join my chart and I will send her the results through there   Component     Latest Ref Rng & Units 11/17/2017  TSH     0.35 - 4.50 uIU/mL 2.17  T4,Free(Direct)     0.60 - 1.60 ng/dL 9.14  Thyroperoxidase Ab SerPl-aCnc     <9 IU/mL 17 (H)  Triiodothyronine,Free,Serum     2.3 - 4.2 pg/mL 3.2  Thyroglobulin Ab     < or = 1 IU/mL <1   Thyroid tests are normal but TPO antibodies are still slightly elevated.  No intervention needed for now. Carlus Pavlov, MD PhD [facility] Endocrinology

## 2017-11-19 DIAGNOSIS — Z9189 Other specified personal risk factors, not elsewhere classified: Secondary | ICD-10-CM | POA: Insufficient documentation

## 2017-11-19 NOTE — Progress Notes (Signed)
Subjective:    [name] is a 72 y.o. female and is here for a comprehensive physical exam.  [name] is a 72 y.o. female who presents for evaluation of palpitations. The symptoms are described as a flutter. The symptoms have been present approximately several months. Associated symptoms include: none. The patient has the following cardiac risk factors: advanced age (older than 36 for men, 39 for women), family history of premature cardiovascular disease and hypertension.  The 10-year ASCVD risk score Denman George DC Montez Hageman., et al., 2013) is: 14.7%   Values used to calculate the score:     Age: 78 years     Sex: Female     Is Non-Hispanic African American: No     Diabetic: No     Tobacco smoker: No     Systolic Blood Pressure: 144 mmHg     Is BP treated: Yes     HDL Cholesterol: 79.6 mg/dL     Total Cholesterol: 202 mg/dL  Current Outpatient Medications:  .  aspirin 81 MG tablet, Take 81 mg by mouth daily., Disp: , Rfl:  .  Cholecalciferol (VITAMIN D) 2000 units CAPS, Take by mouth daily., Disp: , Rfl:  .  diphenhydrAMINE (BENADRYL) 25 MG tablet, Take 25 mg by mouth as needed for allergies. , Disp: , Rfl:  .  fexofenadine (ALLEGRA) 180 MG tablet, Take 180 mg by mouth daily as needed for allergies or rhinitis., Disp: , Rfl:  .  fluticasone (FLONASE) 50 MCG/ACT nasal spray, Place 2 sprays into both nostrils as needed for allergies or rhinitis., Disp: , Rfl:  .  hydrochlorothiazide (HYDRODIURIL) 25 MG tablet, Take 25 mg by mouth daily., Disp: , Rfl:  .  Ketotifen Fumarate (ALLERGY EYE DROPS OP), Apply 1 drop to eye daily as needed (allergies)., Disp: , Rfl:  .  Omega-3 Fatty Acids (FISH OIL) 1000 MG CAPS, Take by mouth daily., Disp: , Rfl:  .  simvastatin (ZOCOR) 40 MG tablet, Take 40 mg by mouth every evening. , Disp: , Rfl:  .  vitamin C (VITAMIN C) 500 MG tablet, Take 1 tablet (500 mg total) by mouth daily., Disp: 60 tablet, Rfl: 2  PMHx, SurgHx, SocialHx, Medications, and  Allergies were reviewed in the Visit Navigator and updated as appropriate.   Past Medical History:  Diagnosis Date  . Arthritis   . Complication of anesthesia   . GERD (gastroesophageal reflux disease)   . High cholesterol   . Hypertension   . PONV (postoperative nausea and vomiting), Scopalamine controls   . Seasonal allergies   . Vitamin D deficiency 03/06/2015     Past Surgical History:  Procedure Laterality Date  . BUNIONECTOMY Bilateral   . COLONOSCOPY    . HARDWARE REMOVAL Right 12/10/2015   Procedure: HARDWARE REMOVAL RIGHT ANKLE;  Surgeon: Myrene Galas, MD;  Location: [facility] OR;  Service: Orthopedics;  Laterality: Right;  . MYOMECTOMY    . ORIF TIBIA FRACTURE Right 01/29/2015   Procedure: OPEN REDUCTION INTERNAL FIXATION (ORIF) RIGHT TIBIA  AND FIBULA FRACTURES;  Surgeon: Myrene Galas, MD;  Location: [facility] OR;  Service: Orthopedics;  Laterality: Right;     Family History  Problem Relation Age of Onset  . CVA Mother   . Heart disease Father   . Diabetes Father   . Heart attack Sister     Social History   Tobacco Use  . Smoking status: Never Smoker  . Smokeless tobacco: Never Used  Substance Use Topics  . Alcohol use:  Yes    Alcohol/week: 3.0 - 4.0 standard drinks    Types: 3 - 4 Glasses of wine per week  . Drug use: No    Review of Systems:   Pertinent items are noted in the HPI. Otherwise, ROS is negative.  Objective:   BP (!) 144/88   Pulse 63   Temp 97.9 F (36.6 C) (Oral)   Ht 5\' 2"  (1.575 m)   Wt 148 lb 12.8 oz (67.5 kg)   SpO2 97%   BMI 27.22 kg/m   General appearance: alert, cooperative and appears stated age. Head: normocephalic, without obvious abnormality, atraumatic. Neck: no adenopathy, supple, symmetrical, trachea midline; thyroid not enlarged, symmetric, no tenderness/mass/nodules. Lungs: clear to auscultation bilaterally. Heart: regular rate and rhythm Abdomen: soft, non-tender; no masses,  no organomegaly. Extremities: extremities  normal, atraumatic, no cyanosis or edema. Skin: skin color, texture, turgor normal, no rashes or lesions. Lymph: cervical, supraclavicular, and axillary nodes normal; no abnormal inguinal nodes palpated. Neurologic: grossly normal.                                      Assessment/Plan:   Diagnoses and all orders for this visit:  Routine physical examination  At risk for cardiovascular event -     Ambulatory referral to Cardiology  Hashimoto's thyroiditis Comments: Labs improved. Will f/u with Dr. Lafe Garin in 6 months.  Palpitations Comments: Elevated ASCVD. Hx of stress test with Dr. Katrinka Blazing. Will send back to see if event monitor/ECHO/Cardiac CT needed.  Orders: -     Ambulatory referral to Cardiology    Patient Counseling: [x]    Nutrition: Stressed importance of moderation in sodium/caffeine intake, saturated fat and cholesterol, caloric balance, sufficient intake of fresh fruits, vegetables, fiber, calcium, iron, and 1 mg of folate supplement per day (for females capable of pregnancy).  [x]    Stressed the importance of regular exercise.   [x]    Substance Abuse: Discussed cessation/primary prevention of tobacco, alcohol, or other drug use; driving or other dangerous activities under the influence; availability of treatment for abuse.   [x]    Injury prevention: Discussed safety belts, safety helmets, smoke detector, smoking near bedding or upholstery.   [x]    Sexuality: Discussed sexually transmitted diseases, partner selection, use of condoms, avoidance of unintended pregnancy  and contraceptive alternatives.  [x]    Dental health: Discussed importance of regular tooth brushing, flossing, and dental visits.  [x]    Health maintenance and immunizations reviewed. Please refer to Health maintenance section.   Helane Rima, DO [place] [facility]

## 2017-11-20 ENCOUNTER — Encounter: Payer: Self-pay | Admitting: Family Medicine

## 2017-11-20 ENCOUNTER — Ambulatory Visit (INDEPENDENT_AMBULATORY_CARE_PROVIDER_SITE_OTHER): Payer: Medicare Other | Admitting: Family Medicine

## 2017-11-20 VITALS — BP 144/88 | HR 63 | Temp 97.9°F | Ht 62.0 in | Wt 148.8 lb

## 2017-11-20 DIAGNOSIS — Z0000 Encounter for general adult medical examination without abnormal findings: Secondary | ICD-10-CM

## 2017-11-20 DIAGNOSIS — Z9189 Other specified personal risk factors, not elsewhere classified: Secondary | ICD-10-CM

## 2017-11-20 DIAGNOSIS — R002 Palpitations: Secondary | ICD-10-CM

## 2017-11-20 DIAGNOSIS — E063 Autoimmune thyroiditis: Secondary | ICD-10-CM | POA: Diagnosis not present

## 2017-11-20 LAB — THYROGLOBULIN ANTIBODY: Thyroglobulin Ab: <1 IU/mL (ref <=1)

## 2017-11-20 LAB — THYROID PEROXIDASE ANTIBODY: Thyroperoxidase Ab SerPl-aCnc: 17 IU/mL — ABNORMAL HIGH (ref <9)

## 2017-12-15 ENCOUNTER — Other Ambulatory Visit: Payer: Self-pay

## 2017-12-15 MED ORDER — HYDROCHLOROTHIAZIDE 25 MG PO TABS: 25.0000 mg | ORAL_TABLET | Freq: Every day | ORAL | 1 refills | Status: DC

## 2017-12-15 MED ORDER — SIMVASTATIN 40 MG PO TABS: 40.0000 mg | ORAL_TABLET | Freq: Every evening | ORAL | 1 refills | Status: DC

## 2018-01-26 ENCOUNTER — Other Ambulatory Visit: Payer: Self-pay | Admitting: Family Medicine

## 2018-01-26 DIAGNOSIS — Z1231 Encounter for screening mammogram for malignant neoplasm of breast: Secondary | ICD-10-CM

## 2018-01-30 ENCOUNTER — Ambulatory Visit
Admission: RE | Admit: 2018-01-30 | Discharge: 2018-01-30 | Disposition: A | Discharge status: Home / Self Care | Payer: Medicare Other | Source: Ambulatory Visit

## 2018-01-30 DIAGNOSIS — Z1231 Encounter for screening mammogram for malignant neoplasm of breast: Secondary | ICD-10-CM

## 2018-05-21 ENCOUNTER — Ambulatory Visit: Payer: Medicare Other | Admitting: Internal Medicine

## 2018-05-22 ENCOUNTER — Ambulatory Visit: Payer: Medicare Other | Admitting: Family Medicine

## 2018-05-22 ENCOUNTER — Ambulatory Visit: Payer: Medicare Other | Admitting: *Deleted

## 2018-05-25 ENCOUNTER — Telehealth: Payer: Self-pay | Admitting: Interventional Cardiology

## 2018-05-25 NOTE — Telephone Encounter (Signed)
Spoke with pt and rescheduled her for 4/14 at 930A

## 2018-05-25 NOTE — Telephone Encounter (Signed)
  Patient is returning a call most likely regarding her upcoming appt

## 2018-05-28 ENCOUNTER — Telehealth: Payer: Self-pay

## 2018-05-28 NOTE — Progress Notes (Signed)
Virtual Visit via Video Note   This visit type was conducted due to national recommendations for restrictions regarding the COVID-19 Pandemic (e.g. social distancing) in an effort to limit this patient's exposure and mitigate transmission in our community.  Due to her co-morbid illnesses, this patient is at least at moderate risk for complications without adequate follow up.  This format is felt to be most appropriate for this patient at this time.  All issues noted in this document were discussed and addressed.  A limited physical exam was performed with this format.  Please refer to the patient's chart for her consent to telehealth for [facility].   Evaluation Performed:  Follow-up visit  Date:  05/29/2018   ID:  Alyssa, [name] 06-Jul-1945, MRN [id number]  Patient Location: Home  Provider Location: Office  PCP:  Helane Rima, DO  Cardiologist:  No primary care provider on file.  Electrophysiologist:  None   Chief Complaint:  Palpitations  History of Present Illness:    [name] is a 73 y.o. female who presents via audio/video conferencing for a telehealth visit today.    [name] is referred by Dr. Helane Rima for evaluation/consultation concerning "daily palpitations".  She states that she will have a fluttering in her chest intermittently.  These episodes are very short-lived.  She does not feel episodes have included sustained tachycardia.  These episodes are not precipitated by physical activity.  These episodes have been going on now for several months.  She has never had syncope or palpitation.  She has no personal history of significant vascular disease although she does have a risk factor profile that includes a family history of vascular disease and hyperlipidemia.  She drinks 3 cups of caffeinated coffee per day.  She denies consumption of tea and alcohol.  Mrs. Klann has a family history of stroke and cardiovascular disease.  She underwent an evaluation  in 2014 for atypical chest pain.  A stress test and EKG were all normal at that time.  She is physically active.  She walks on a daily basis.  She has not noted a change in exertional tolerance.  Current CV risk factors include hypertension, age, and hyperlipidemia.  The patient does not have symptoms concerning for COVID-19 infection (fever, chills, cough, or new shortness of breath).    Past Medical History:  Diagnosis Date  . Arthritis   . Chest pain at rest 01/14/2013  . Complication of anesthesia   . Fracture of tibia and fibula 01/27/2015  . GERD (gastroesophageal reflux disease)   . Hashimoto's thyroiditis 11/17/2017   Lab Results Component Value Date  TSH 2.17 11/17/2017  FREET4 0.87 11/17/2017   Lab Results Component Value Date  TSH 2.17 11/17/2017  TSH 2.72 09/08/2017  TSH 5.20 (H) 11/21/2016  TSH 3.813 01/29/2015  FREET4 0.87 11/17/2017  FREET4 0.83 11/21/2016    . High cholesterol   . Hypertension   . PONV (postoperative nausea and vomiting), Scopalamine controls   . Seasonal allergies   . 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 Syndesmotic disruption of right ankle 03/06/2015  . Vitamin D deficiency 03/06/2015   Past Surgical History:  Procedure Laterality Date  . BUNIONECTOMY Bilateral   . COLONOSCOPY    . HARDWARE REMOVAL Right 12/10/2015   Procedure: HARDWARE REMOVAL RIGHT ANKLE;  Surgeon: Myrene Galas, MD;  Location: [facility] OR;  Service: Orthopedics;  Laterality: Right;  . MYOMECTOMY    . ORIF TIBIA FRACTURE Right 01/29/2015   Procedure: OPEN REDUCTION  INTERNAL FIXATION (ORIF) RIGHT TIBIA  AND FIBULA FRACTURES;  Surgeon: Myrene Galas, MD;  Location: [facility] OR;  Service: Orthopedics;  Laterality: Right;     Current Meds  Medication Sig  . aspirin 81 MG tablet Take 81 mg by mouth daily.  . cholecalciferol (VITAMIN D3) 25 MCG (1000 UT) tablet Take by mouth daily.   . Coenzyme Q10 (COQ10) 100 MG CAPS Take 100 mg by mouth daily.  . diphenhydrAMINE (BENADRYL) 25 MG tablet Take  25 mg by mouth as needed for allergies.   . fluticasone (FLONASE) 50 MCG/ACT nasal spray Place 2 sprays into both nostrils as needed for allergies or rhinitis.  . hydrochlorothiazide (HYDRODIURIL) 25 MG tablet Take 1 tablet (25 mg total) by mouth daily.  Marland Kitchen Ketotifen Fumarate (ALLERGY EYE DROPS OP) Apply 1 drop to eye daily as needed (allergies).  . Multiple Vitamin (MULTIVITAMIN) tablet Take 1 tablet by mouth daily.  . Omega-3 Fatty Acids (FISH OIL) 1000 MG CAPS Take by mouth daily.  . Selenium 200 MCG CAPS Take 1 capsule by mouth daily.  . simvastatin (ZOCOR) 40 MG tablet Take 1 tablet (40 mg total) by mouth every evening.  . TURMERIC PO Take 1,000 mg by mouth 2 (two) times daily.  . vitamin C (VITAMIN C) 500 MG tablet Take 1 tablet (500 mg total) by mouth daily.     Allergies:   Patient has no known allergies.   Social History   Tobacco Use  . Smoking status: Never Smoker  . Smokeless tobacco: Never Used  Substance Use Topics  . Alcohol use: Yes    Alcohol/week: 3.0 - 4.0 standard drinks    Types: 3 - 4 Glasses of wine per week  . Drug use: No     Family Hx: The patient's family history includes CVA in her mother; Diabetes in her father; Heart attack in her sister; Heart disease in her father.  ROS:   Please see the history of present illness.    She is sleeping well.  She does not snore according to the observations made by her husband.  No peripheral edema is noted.  Overall doing quite well. All other systems reviewed and are negative.   Prior CV studies:   The following studies were reviewed today:  No recent CV studies have been performed.  Labs/Other Tests and Data Reviewed:    EKG:  An ECG dated September 08, 2017 was personally reviewed today and demonstrated:   sinus bradycardia at a rate of 55 bpm with no other abnormalities noted.  Recent Labs: 09/08/2017: ALT 21; BUN 15; Creatinine, Ser 0.85; Hemoglobin 15.5; Platelets 240.0; Potassium 3.7; Sodium 139 11/17/2017:  TSH 2.17   Recent Lipid Panel Lab Results  Component Value Date/Time   CHOL 202 (H) 09/08/2017 07:29 AM   TRIG 60.0 09/08/2017 07:29 AM   HDL 79.60 09/08/2017 07:29 AM   CHOLHDL 3 09/08/2017 07:29 AM   LDLCALC 111 (H) 09/08/2017 07:29 AM    Wt Readings from Last 3 Encounters:  05/29/18 146 lb (66.2 kg)  11/20/17 148 lb 12.8 oz (67.5 kg)  11/17/17 149 lb (67.6 kg)     Objective:    Vital Signs:  BP 123/85   Pulse 68   Ht 5\' 2"  (1.575 m)   Wt 146 lb (66.2 kg)   BMI 26.70 kg/m    Well nourished, well developed female in no acute distress. Normal skin color.  Normal respiratory pattern.  No peripheral edema.  No visualized neck vein distention.  ASSESSMENT & PLAN:    1. Palpitations   2. Essential hypertension, benign, on HCTZ   3. Mixed hyperlipidemia   4. Educated About Covid-19 Virus Infection    PLAN according to problems:  1. Suspect PACs or PVCs.  Given her age and relative bradycardia on EKG, a 2-week continuous monitor will be performed.  No restrictions on physical activity.  Report if activity brings on symptoms. 2. Her blood pressure is adequately controlled.  Target 130/80 mmHg or less. 3. LDL target should be less than 100 and preferably 70.  When last evaluated in summer 2019, LDL was 111.  She is on simvastatin 40.  Consider adding ezetimibe.  Overall she seems to be doing well.  I encouraged continued physical activity and primary prevention.  Overall education and awareness concerning primary/secondary risk prevention was discussed in detail: LDL less than 70, hemoglobin A1c less than 7, blood pressure target less than 130/80 mmHg, >150 minutes of moderate aerobic activity per week, avoidance of smoking, weight control (via diet and exercise), and continued surveillance/management of/for obstructive sleep apnea.   COVID-19 Education: The signs and symptoms of COVID-19 were discussed with the patient and how to seek care for testing (follow up with PCP or  arrange E-visit).  The importance of social distancing was discussed today.  Time:   Today, I have spent 15 minutes with the patient with telehealth technology discussing the above problems.     Medication Adjustments/Labs and Tests Ordered: Current medicines are reviewed at length with the patient today.  Concerns regarding medicines are outlined above.  Tests Ordered: No orders of the defined types were placed in this encounter.   Medication Changes: No orders of the defined types were placed in this encounter.   Disposition:  Follow up in a few Weeks to months if needed or dictated by monitor results.  Signed, Lesleigh NoeHenry W Smith III, MD  05/29/2018 10:06 AM    [facility]

## 2018-05-28 NOTE — Telephone Encounter (Signed)
Consent form sent through pt's MyChart. Pt agreed to have vitals, weight and current medications available prior to her appt time.

## 2018-05-29 ENCOUNTER — Telehealth (INDEPENDENT_AMBULATORY_CARE_PROVIDER_SITE_OTHER): Payer: Medicare Other | Admitting: Interventional Cardiology

## 2018-05-29 ENCOUNTER — Encounter: Payer: Self-pay | Admitting: Interventional Cardiology

## 2018-05-29 ENCOUNTER — Telehealth: Payer: Self-pay | Admitting: *Deleted

## 2018-05-29 ENCOUNTER — Other Ambulatory Visit: Payer: Self-pay

## 2018-05-29 VITALS — BP 123/85 | HR 68 | Ht 62.0 in | Wt 146.0 lb

## 2018-05-29 DIAGNOSIS — E782 Mixed hyperlipidemia: Secondary | ICD-10-CM

## 2018-05-29 DIAGNOSIS — I10 Essential (primary) hypertension: Secondary | ICD-10-CM

## 2018-05-29 DIAGNOSIS — R002 Palpitations: Secondary | ICD-10-CM

## 2018-05-29 DIAGNOSIS — Z7189 Other specified counseling: Secondary | ICD-10-CM

## 2018-05-29 NOTE — Patient Instructions (Signed)
Medication Instructions:  Your physician recommends that you continue on your current medications as directed. Please refer to the Current Medication list given to you today.  If you need a refill on your cardiac medications before your next appointment, please call your pharmacy.   Lab work: None If you have labs (blood work) drawn today and your tests are completely normal, you will receive your results only by: Marland Kitchen MyChart Message (if you have MyChart) OR . A paper copy in the mail If you have any lab test that is abnormal or we need to change your treatment, we will call you to review the results.  Testing/Procedures: Your physician has recommended that you wear a ZIO monitor. Event monitors are medical devices that record the heart's electrical activity. Doctors most often [place] these monitors to diagnose arrhythmias. Arrhythmias are problems with the speed or rhythm of the heartbeat. The monitor is a small, portable device. You can wear one while you do your normal daily activities. This is usually used to diagnose what is causing palpitations/syncope (passing out).    Follow-Up: Your physician recommends that you schedule a follow-up appointment as needed with Dr. Katrinka Blazing.   Any Other Special Instructions Will Be Listed Below (If Applicable).

## 2018-05-29 NOTE — Telephone Encounter (Signed)
Patient informed she has been enrolled for [employer] to mail a 14 day ZIO XT patch directly to her home.  Instructions briefly reviewed as the are included in the ZIO patch kit.  Follow instructions to apply, do not shower for 24 hours after application.  In 14 days, mail monitor back to [employer] in [place] included.  Call [employer] directly with questions or concerns regarding monitor.  They are available 24/7.

## 2018-05-31 ENCOUNTER — Ambulatory Visit (INDEPENDENT_AMBULATORY_CARE_PROVIDER_SITE_OTHER): Payer: Medicare Other

## 2018-05-31 DIAGNOSIS — R002 Palpitations: Secondary | ICD-10-CM | POA: Diagnosis not present

## 2018-06-04 ENCOUNTER — Ambulatory Visit: Payer: Medicare Other | Admitting: Family Medicine

## 2018-06-05 ENCOUNTER — Ambulatory Visit: Payer: Medicare Other | Admitting: Interventional Cardiology

## 2018-06-05 ENCOUNTER — Telehealth: Payer: Medicare Other | Admitting: Interventional Cardiology

## 2018-06-21 ENCOUNTER — Other Ambulatory Visit: Payer: Self-pay

## 2018-07-23 ENCOUNTER — Other Ambulatory Visit: Payer: Self-pay | Admitting: Family Medicine

## 2018-07-24 NOTE — Telephone Encounter (Signed)
Last OV 11/20/17 Last refill Simvastatin and HCT 12/15/17 #90/1 Next OV 08/20/18

## 2018-08-15 ENCOUNTER — Encounter: Payer: Self-pay | Admitting: Family Medicine

## 2018-08-15 ENCOUNTER — Other Ambulatory Visit: Payer: Self-pay

## 2018-08-15 ENCOUNTER — Ambulatory Visit (INDEPENDENT_AMBULATORY_CARE_PROVIDER_SITE_OTHER): Payer: Medicare Other | Admitting: Family Medicine

## 2018-08-15 VITALS — BP 120/80 | HR 63 | Temp 98.6°F | Ht 62.0 in | Wt 146.0 lb

## 2018-08-15 DIAGNOSIS — B372 Candidiasis of skin and nail: Secondary | ICD-10-CM

## 2018-08-15 DIAGNOSIS — I10 Essential (primary) hypertension: Secondary | ICD-10-CM

## 2018-08-15 DIAGNOSIS — E063 Autoimmune thyroiditis: Secondary | ICD-10-CM

## 2018-08-15 DIAGNOSIS — K219 Gastro-esophageal reflux disease without esophagitis: Secondary | ICD-10-CM | POA: Diagnosis not present

## 2018-08-15 DIAGNOSIS — B351 Tinea unguium: Secondary | ICD-10-CM | POA: Diagnosis not present

## 2018-08-15 DIAGNOSIS — E782 Mixed hyperlipidemia: Secondary | ICD-10-CM | POA: Diagnosis not present

## 2018-08-15 LAB — CBC WITH DIFFERENTIAL/PLATELET
Basophils Absolute: 0 10*3/uL (ref 0.0–0.1)
Basophils Relative: 0.6 % (ref 0.0–3.0)
Eosinophils Absolute: 0.1 10*3/uL (ref 0.0–0.7)
Eosinophils Relative: 1.1 % (ref 0.0–5.0)
HCT: 45.6 % (ref 36.0–46.0)
Hemoglobin: 15.7 g/dL — ABNORMAL HIGH (ref 12.0–15.0)
Lymphocytes Relative: 26.8 % (ref 12.0–46.0)
Lymphs Abs: 1.3 10*3/uL (ref 0.7–4.0)
MCHC: 34.5 g/dL (ref 30.0–36.0)
MCV: 93.2 fl (ref 78.0–100.0)
Monocytes Absolute: 0.4 10*3/uL (ref 0.1–1.0)
Monocytes Relative: 7.3 % (ref 3.0–12.0)
Neutro Abs: 3.1 10*3/uL (ref 1.4–7.7)
Neutrophils Relative %: 64.2 % (ref 43.0–77.0)
Platelets: 241 10*3/uL (ref 150.0–400.0)
RBC: 4.9 Mil/uL (ref 3.87–5.11)
RDW: 13.1 % (ref 11.5–15.5)
WBC: 4.9 10*3/uL (ref 4.0–10.5)

## 2018-08-15 LAB — TSH: TSH: 1.98 u[IU]/mL (ref 0.35–4.50)

## 2018-08-15 MED ORDER — FLUCONAZOLE 150 MG PO TABS: 150.0000 mg | ORAL_TABLET | Freq: Once | ORAL | 0 refills | Status: AC

## 2018-08-15 NOTE — Progress Notes (Signed)
[name] is a 73 y.o. female is here for follow up.  History of Present Illness:   HPI:    1. Gastroesophageal reflux disease without esophagitis   2. Essential hypertension, benign, on HCTZ. At goal. Had event monitor with Dr. Katrinka BlazingSmith. Dx with ectopic atrial tachycardia. Offer beta blocker if it worsens. Decreased caffeine and controlled at this time.   3. Mixed hyperlipidemia. Due for lipid panel today.  4. Onychomycosis. New. Worst at left great toe, corner.   5. Candidal intertrigo. New Bilateral. Works outside a lot. Sweats.   Dep[facility] 2/9 08/15/2018 02/28/2017 05/09/2016  Decreased Interest 0 0 0  Down, Depressed, Hopeless 0 0 0  PHQ - 2 Score 0 0 0  Altered sleeping 1 0 -  Tired, decreased energy 0 0 -  Change in appetite 0 0 -  Feeling bad or failure about yourself  0 0 -  Trouble concentrating 0 0 -  Moving slowly or fidgety/restless 0 0 -  Suicidal thoughts 0 0 -  PHQ-9 Score 1 0 -  Difficult doing work/chores Not difficult at all Not difficult at all -   PMHx, SurgHx, SocialHx, FamHx, Medications, and Allergies were reviewed in the Visit Navigator and updated as appropriate.   Patient Active Problem List   Diagnosis Date Noted  . At risk for cardiovascular event 11/19/2017  . Hashimoto's thyroiditis 11/17/2017  . Seasonal allergies   . GERD (gastroesophageal reflux disease)   . Vitamin D deficiency 03/06/2015  . HLD (hyperlipidemia), on Zocor   . Essential hypertension, benign, on HCTZ 01/14/2013   Social History   Tobacco Use  . Smoking status: Never Smoker  . Smokeless tobacco: Never Used  Substance Use Topics  . Alcohol use: Yes    Alcohol/week: 3.0 - 4.0 standard drinks    Types: 3 - 4 Glasses of wine per week  . Drug use: No   Current Medications and Allergies   .  aspirin 81 MG tablet, Take 81 mg by mouth daily., Disp: , Rfl:  .  cholecalciferol (VITAMIN D3) 25 MCG (1000 UT) tablet, Take by mouth daily. , Disp: , Rfl:  .   Coenzyme Q10 (COQ10) 100 MG CAPS, Take 100 mg by mouth daily., Disp: , Rfl:  .  diphenhydrAMINE (BENADRYL) 25 MG tablet, Take 25 mg by mouth as needed for allergies. , Disp: , Rfl:  .  fluticasone (FLONASE) 50 MCG/ACT nasal spray, Place 2 sprays into both nostrils as needed for allergies or rhinitis., Disp: , Rfl:  .  hydrochlorothiazide (HYDRODIURIL) 25 MG tablet, TAKE 1 TABLET BY MOUTH  DAILY, Disp: 90 tablet, Rfl: 0 .  Ketotifen Fumarate (ALLERGY EYE DROPS OP), Apply 1 drop to eye daily as needed (allergies)., Disp: , Rfl:  .  Multiple Vitamin (MULTIVITAMIN) tablet, Take 1 tablet by mouth daily., Disp: , Rfl:  .  Omega-3 Fatty Acids (FISH OIL) 1000 MG CAPS, Take by mouth daily., Disp: , Rfl:  .  Selenium 200 MCG CAPS, Take 1 capsule by mouth daily., Disp: , Rfl:  .  simvastatin (ZOCOR) 40 MG tablet, TAKE 1 TABLET BY MOUTH  EVERY EVENING, Disp: 90 tablet, Rfl: 0 .  TURMERIC PO, Take 1,000 mg by mouth 2 (two) times daily., Disp: , Rfl:  .  vitamin C (VITAMIN C) 500 MG tablet, Take 1 tablet (500 mg total) by mouth daily., Disp: 60 tablet, Rfl: 2 .  fluconazole (DIFLUCAN) 150 MG tablet, Take 1 tablet (150 mg total) by mouth  once for 1 dose., Disp: 4 tablet, Rfl: 0  No Known Allergies   Review of Systems   Pertinent items are noted in the HPI. Otherwise, a complete ROS is negative.  Vitals   Vitals:   08/15/18 1036  BP: 120/80  Pulse: 63  Temp: 98.6 F (37 C)  TempSrc: Oral  SpO2: 97%  Weight: 146 lb (66.2 kg)  Height: 5\' 2"  (1.575 m)     Body mass index is 26.7 kg/m.  Physical Exam   Physical Exam Vitals signs and nursing note reviewed.  HENT:     Head: Normocephalic and atraumatic.  Eyes:     Pupils: Pupils are equal, round, and reactive to light.  Neck:     Musculoskeletal: Normal range of motion and neck supple.  Cardiovascular:     Rate and Rhythm: Normal rate and regular rhythm.     Heart sounds: Normal heart sounds.  Pulmonary:     Effort: Pulmonary effort is  normal.  Abdominal:     Palpations: Abdomen is soft.  Skin:    General: Skin is warm.  Psychiatric:        Behavior: Behavior normal.    Assessment and Plan   [name] was seen today for follow-up and rash.  Diagnoses and all orders for this visit:  Candidal intertrigo -     fluconazole (DIFLUCAN) 150 MG tablet; Take 1 tablet (150 mg total) by mouth once for 1 dose.  Gastroesophageal reflux disease without esophagitis  Essential hypertension, benign, on HCTZ  Mixed hyperlipidemia -     Lipid panel  Onychomycosis -     fluconazole (DIFLUCAN) 150 MG tablet; Take 1 tablet (150 mg total) by mouth once for 1 dose. -     CBC with Differential/Platelet -     Comprehensive metabolic panel  Hashimoto's thyroiditis -     TSH   . Orders and follow up as documented in [place], reviewed diet, exercise and weight control, cardiovascular risk and specific lipid/LDL goals reviewed, reviewed medications and side effects in detail.  . Reviewed expectations re: course of current medical issues. . Outlined signs and symptoms indicating need for more acute intervention. . Patient verbalized understanding and all questions were answered. . Patient received an After Visit Summary.  Briscoe Deutscher, DO [place], [facility] 08/15/2018

## 2018-08-16 LAB — COMPREHENSIVE METABOLIC PANEL
ALT: 22 U/L (ref 0–35)
AST: 24 U/L (ref 0–37)
Albumin: 4.5 g/dL (ref 3.5–5.2)
Alkaline Phosphatase: 55 U/L (ref 39–117)
BUN: 17 mg/dL (ref 6–23)
CO2: 26 mEq/L (ref 19–32)
Calcium: 9.6 mg/dL (ref 8.4–10.5)
Chloride: 100 mEq/L (ref 96–112)
Creatinine, Ser: 0.92 mg/dL (ref 0.40–1.20)
GFR: 60.15 mL/min (ref >60.00)
Glucose, Bld: 94 mg/dL (ref 70–99)
Potassium: 3.9 mEq/L (ref 3.5–5.1)
Sodium: 141 mEq/L (ref 135–145)
Total Bilirubin: 0.5 mg/dL (ref 0.2–1.2)
Total Protein: 6.9 g/dL (ref 6.0–8.3)

## 2018-08-16 LAB — LIPID PANEL
Cholesterol: 204 mg/dL — ABNORMAL HIGH (ref 0–200)
HDL: 92 mg/dL (ref >39.00)
LDL Cholesterol: 102 mg/dL — ABNORMAL HIGH (ref 0–99)
NonHDL: 112.13
Total CHOL/HDL Ratio: 2
Triglycerides: 53 mg/dL (ref 0.0–149.0)
VLDL: 10.6 mg/dL (ref 0.0–40.0)

## 2018-08-20 ENCOUNTER — Ambulatory Visit: Payer: Medicare Other | Admitting: Family Medicine

## 2018-10-17 ENCOUNTER — Other Ambulatory Visit: Payer: Self-pay | Admitting: Family Medicine

## 2018-11-16 ENCOUNTER — Ambulatory Visit: Payer: Medicare Other | Admitting: Family Medicine

## 2018-11-19 ENCOUNTER — Other Ambulatory Visit: Payer: Self-pay

## 2018-11-19 ENCOUNTER — Encounter: Payer: Self-pay | Admitting: Physician Assistant

## 2018-11-19 ENCOUNTER — Ambulatory Visit (INDEPENDENT_AMBULATORY_CARE_PROVIDER_SITE_OTHER): Payer: Medicare Other | Admitting: Physician Assistant

## 2018-11-19 VITALS — BP 126/82 | HR 58 | Temp 98.1°F | Ht 62.0 in | Wt 150.5 lb

## 2018-11-19 DIAGNOSIS — I10 Essential (primary) hypertension: Secondary | ICD-10-CM | POA: Diagnosis not present

## 2018-11-19 DIAGNOSIS — R21 Rash and other nonspecific skin eruption: Secondary | ICD-10-CM

## 2018-11-19 DIAGNOSIS — Z23 Encounter for immunization: Secondary | ICD-10-CM

## 2018-11-19 MED ORDER — KETOCONAZOLE 2 % EX CREA: TOPICAL_CREAM | CUTANEOUS | 0 refills | Status: DC

## 2018-11-19 MED ORDER — TRIAMCINOLONE ACETONIDE 0.1 % EX CREA: TOPICAL_CREAM | CUTANEOUS | 0 refills | Status: DC

## 2018-11-19 NOTE — Patient Instructions (Signed)
It was great to see you!  For your rash: -May trial the steroid cream (triamcinolone) and/or the fungal cream (ketoconazole) -If one of them makes the area worse, please stop use -If symptoms persist, we should get dermatology involved  For your heart: -BP looks good today -If palpitations persist or change, please follow-up with your cardiologist.  I recommend Dr. Jonni Sanger for your PCP.  Take care,  Inda Coke PA-C

## 2018-11-19 NOTE — Progress Notes (Signed)
[name] is a 73 y.o. female is here to discuss: Hypertension  I acted as a Education administrator for [employer], PA-C Anselmo Pickler, LPN  History of Present Illness:   Chief Complaint  Patient presents with  . Hypertension    HPI   Hypertension Pt following up today on blood pressure. She is checking blood pressure daily, Systolic 893-810, diastolic mid 17'P. Currently taking HCTZ 25 mg daily. Pt denies headaches, dizziness, blurred vision, chest pain, SOB or lower leg edema. Denies excessive caffeine intake, stimulant usage, excessive alcohol intake or increase in salt consumption.  History of palpitations, has had cardiology evaluation with Dr. Daneen Schick. 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 She states that she continues to have palpitations and that they have not become more frequent or bothersome, but they are still there. Has strong family history of heart issues.  Rash Has had ongoing rash in bilateral axilla and chest wall. She states that she has switched laundry detergent to see if this would help (has chosen a "free and clear" detergent) and didn't have much improvement. Was prescribed fluconazole weekly x 4 weeks for toenail fungus around July 1, and did have improvement with those symptoms, but not the other rash. Denies: open areas, pain, itching.    Health Maintenance Due  Topic Date Due  . INFLUENZA VACCINE  09/15/2018    Past Medical History:  Diagnosis Date  . Arthritis   . Chest pain at rest 01/14/2013  . Complication of anesthesia   . Fracture of tibia and fibula 01/27/2015  . GERD (gastroesophageal reflux disease)   . Hashimoto's thyroiditis 11/17/2017   Lab Results Component Value Date  TSH 2.17 11/17/2017  FREET4 0.87 11/17/2017   Lab Results Component Value Date  TSH 2.17 11/17/2017  TSH 2.72 09/08/2017  TSH 5.20 (H) 11/21/2016  TSH 3.813 01/29/2015  FREET4 0.87 11/17/2017  FREET4 0.83 11/21/2016    . High cholesterol   .  Hypertension   . PONV (postoperative nausea and vomiting), Scopalamine controls   . Seasonal allergies   . 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 Syndesmotic disruption of right ankle 03/06/2015  . Vitamin D deficiency 03/06/2015     Social History   Socioeconomic History  . Marital status: Married    Spouse name: Not on file  . Number of children: Not on file  . Years of education: Not on file  . Highest education level: Not on file  Occupational History  . Occupation: Retired Tour manager  . Financial resource strain: Not on file  . Food insecurity    Worry: Not on file    Inability: Not on file  . Transportation needs    Medical: Not on file    Non-medical: Not on file  Tobacco Use  . Smoking status: Never Smoker  . Smokeless tobacco: Never Used  Substance and Sexual Activity  . Alcohol use: Yes    Alcohol/week: 3.0 - 4.0 standard drinks    Types: 3 - 4 Glasses of wine per week  . Drug use: No  . Sexual activity: Yes  Lifestyle  . Physical activity    Days per week: Not on file    Minutes per session: Not on file  . Stress: Not on file  Relationships  . Social Herbalist on phone: Not on file    Gets together: Not on file    Attends religious service: Not on file  Active member of club or organization: Not on file    Attends meetings of clubs or organizations: Not on file    Relationship status: Not on file  . Intimate partner violence    Fear of current or ex partner: Not on file    Emotionally abused: Not on file    Physically abused: Not on file    Forced sexual activity: Not on file  Other Topics Concern  . Not on file  Social History Narrative  . Not on file    Past Surgical History:  Procedure Laterality Date  . BUNIONECTOMY Bilateral   . COLONOSCOPY    . HARDWARE REMOVAL Right 12/10/2015   Procedure: HARDWARE REMOVAL RIGHT ANKLE;  Surgeon: Myrene Galas, MD;  Location: [facility] OR;  Service: Orthopedics;  Laterality: Right;  .  MYOMECTOMY    . ORIF TIBIA FRACTURE Right 01/29/2015   Procedure: OPEN REDUCTION INTERNAL FIXATION (ORIF) RIGHT TIBIA  AND FIBULA FRACTURES;  Surgeon: Myrene Galas, MD;  Location: [facility] OR;  Service: Orthopedics;  Laterality: Right;    Family History  Problem Relation Age of Onset  . CVA Mother   . Heart disease Father   . Diabetes Father   . Heart attack Sister     PMHx, SurgHx, SocialHx, FamHx, Medications, and Allergies were reviewed in the Visit Navigator and updated as appropriate.   Patient Active Problem List   Diagnosis Date Noted  . At risk for cardiovascular event 11/19/2017  . Hashimoto's thyroiditis 11/17/2017  . Seasonal allergies   . GERD (gastroesophageal reflux disease)   . Vitamin D deficiency 03/06/2015  . HLD (hyperlipidemia), on Zocor   . Essential hypertension, benign, on HCTZ 01/14/2013    Social History   Tobacco Use  . Smoking status: Never Smoker  . Smokeless tobacco: Never Used  Substance Use Topics  . Alcohol use: Yes    Alcohol/week: 3.0 - 4.0 standard drinks    Types: 3 - 4 Glasses of wine per week  . Drug use: No    Current Medications and Allergies:    Current Outpatient Medications:  .  aspirin 81 MG tablet, Take 81 mg by mouth daily., Disp: , Rfl:  .  cholecalciferol (VITAMIN D3) 25 MCG (1000 UT) tablet, Take by mouth daily. , Disp: , Rfl:  .  Coenzyme Q10 (COQ10) 100 MG CAPS, Take 100 mg by mouth daily., Disp: , Rfl:  .  diphenhydrAMINE (BENADRYL) 25 MG tablet, Take 25 mg by mouth as needed for allergies. , Disp: , Rfl:  .  fluticasone (FLONASE) 50 MCG/ACT nasal spray, Place 2 sprays into both nostrils as needed for allergies or rhinitis., Disp: , Rfl:  .  hydrochlorothiazide (HYDRODIURIL) 25 MG tablet, TAKE 1 TABLET BY MOUTH  DAILY, Disp: 90 tablet, Rfl: 3 .  Ketotifen Fumarate (ALLERGY EYE DROPS OP), Apply 1 drop to eye daily as needed (allergies)., Disp: , Rfl:  .  Multiple Vitamin (MULTIVITAMIN) tablet, Take 1 tablet by mouth  daily., Disp: , Rfl:  .  Omega-3 Fatty Acids (FISH OIL) 1000 MG CAPS, Take by mouth daily., Disp: , Rfl:  .  Selenium 200 MCG CAPS, Take 1 capsule by mouth daily., Disp: , Rfl:  .  simvastatin (ZOCOR) 40 MG tablet, TAKE 1 TABLET BY MOUTH IN  THE EVENING, Disp: 90 tablet, Rfl: 3 .  TURMERIC PO, Take 1,000 mg by mouth 2 (two) times daily., Disp: , Rfl:  .  vitamin C (VITAMIN C) 500 MG tablet, Take 1 tablet (500 mg  total) by mouth daily., Disp: 60 tablet, Rfl: 2 .  ketoconazole (NIZORAL) 2 % cream, Apply to affected area 1-2 times daily, Disp: 15 g, Rfl: 0 .  triamcinolone cream (KENALOG) 0.1 %, Apply to affected area 1-2 times daily, Disp: 30 g, Rfl: 0  No Known Allergies  Review of Systems   ROS Negative unless otherwise specified per HPI.  Vitals:   Vitals:   11/19/18 0803  BP: 126/82  Pulse: (!) 58  Temp: 98.1 F (36.7 C)  TempSrc: Temporal  SpO2: 96%  Weight: 150 lb 8 oz (68.3 kg)  Height: 5\' 2"  (1.575 m)     Body mass index is 27.53 kg/m.   Physical Exam:    Physical Exam Vitals signs and nursing note reviewed.  Constitutional:      General: She is not in acute distress.    Appearance: She is well-developed. She is not ill-appearing or toxic-appearing.  Cardiovascular:     Rate and Rhythm: Normal rate and regular rhythm.     Pulses: Normal pulses.     Heart sounds: Normal heart sounds, S1 normal and S2 normal.     Comments: No LE edema Pulmonary:     Effort: Pulmonary effort is normal.     Breath sounds: Normal breath sounds.  Skin:    General: Skin is warm and dry.     Comments: Very mild erythematous plaques to bilateral lower axilla areas; front of chest with diffuse very mild erythema  Neurological:     Mental Status: She is alert.     GCS: GCS eye subscore is 4. GCS verbal subscore is 5. GCS motor subscore is 6.  Psychiatric:        Speech: Speech normal.        Behavior: Behavior normal. Behavior is cooperative.      Assessment and Plan:     Johnny BridgeMartha was seen today for hypertension.  Diagnoses and all orders for this visit:  Essential hypertension, benign, on HCTZ Well controlled, continue current regimen. I did recommend that if palpitations become more bothersome, to reach out to cardiology.  Rash Unclear etiology. Provided her with triamcinolone and ketoconazole creams. Recommended that she trial these and let us know if no improvement, consider derm or biopsy.  Need for immunization against influenza -     Flu Vaccine QUAD High Dose(Fluad)  Other orders -     ketoconazole (NIZORAL) 2 % cream; Apply to affected area 1-2 times daily -     triamcinolone cream (KENALOG) 0.1 %; Apply to affected area 1-2 times daily    . Reviewed expectations re: course of current medical issues. . Discussed self-management of symptoms. . Outlined signs and symptoms indicating need for more acute intervention. . Patient verbalized understanding and all questions were answered. . See orders for this visit as documented in the electronic medical record. . Patient received an After Visit Summary.  CMA or LPN served as scribe during this visit. History, Physical, and Plan performed by medical provider. The above documentation has been reviewed and is accurate and complete.  Jarold MottoSamantha Afra Tricarico, PA-C [place], [facility] 11/19/2018  Follow-up: No follow-ups on file.

## 2019-02-20 ENCOUNTER — Other Ambulatory Visit: Payer: Self-pay

## 2019-02-21 ENCOUNTER — Ambulatory Visit (INDEPENDENT_AMBULATORY_CARE_PROVIDER_SITE_OTHER): Payer: Medicare PPO | Admitting: Family Medicine

## 2019-02-21 ENCOUNTER — Encounter: Payer: Self-pay | Admitting: Family Medicine

## 2019-02-21 ENCOUNTER — Telehealth: Payer: Self-pay | Admitting: Family Medicine

## 2019-02-21 VITALS — BP 144/82 | HR 60 | Temp 97.9°F | Ht 62.0 in | Wt 150.2 lb

## 2019-02-21 DIAGNOSIS — I10 Essential (primary) hypertension: Secondary | ICD-10-CM | POA: Diagnosis not present

## 2019-02-21 DIAGNOSIS — R7301 Impaired fasting glucose: Secondary | ICD-10-CM

## 2019-02-21 DIAGNOSIS — J302 Other seasonal allergic rhinitis: Secondary | ICD-10-CM

## 2019-02-21 DIAGNOSIS — R7309 Other abnormal glucose: Secondary | ICD-10-CM | POA: Diagnosis not present

## 2019-02-21 DIAGNOSIS — I471 Supraventricular tachycardia: Secondary | ICD-10-CM

## 2019-02-21 DIAGNOSIS — E063 Autoimmune thyroiditis: Secondary | ICD-10-CM

## 2019-02-21 DIAGNOSIS — K219 Gastro-esophageal reflux disease without esophagitis: Secondary | ICD-10-CM

## 2019-02-21 DIAGNOSIS — Z0000 Encounter for general adult medical examination without abnormal findings: Secondary | ICD-10-CM

## 2019-02-21 DIAGNOSIS — E782 Mixed hyperlipidemia: Secondary | ICD-10-CM

## 2019-02-21 DIAGNOSIS — I4719 Other supraventricular tachycardia: Secondary | ICD-10-CM

## 2019-02-21 LAB — CBC WITH DIFFERENTIAL/PLATELET
Basophils Absolute: 0 10*3/uL (ref 0.0–0.1)
Basophils Relative: 1 % (ref 0.0–3.0)
Eosinophils Absolute: 0.1 10*3/uL (ref 0.0–0.7)
Eosinophils Relative: 1.6 % (ref 0.0–5.0)
HCT: 48.7 % — ABNORMAL HIGH (ref 36.0–46.0)
Hemoglobin: 16.5 g/dL — ABNORMAL HIGH (ref 12.0–15.0)
Lymphocytes Relative: 25.3 % (ref 12.0–46.0)
Lymphs Abs: 1.2 10*3/uL (ref 0.7–4.0)
MCHC: 33.9 g/dL (ref 30.0–36.0)
MCV: 92.9 fl (ref 78.0–100.0)
Monocytes Absolute: 0.3 10*3/uL (ref 0.1–1.0)
Monocytes Relative: 7.1 % (ref 3.0–12.0)
Neutro Abs: 3.2 10*3/uL (ref 1.4–7.7)
Neutrophils Relative %: 65 % (ref 43.0–77.0)
Platelets: 249 10*3/uL (ref 150.0–400.0)
RBC: 5.24 Mil/uL — ABNORMAL HIGH (ref 3.87–5.11)
RDW: 13.2 % (ref 11.5–15.5)
WBC: 4.9 10*3/uL (ref 4.0–10.5)

## 2019-02-21 LAB — LIPID PANEL
Cholesterol: 220 mg/dL — ABNORMAL HIGH (ref 0–200)
HDL: 97.7 mg/dL (ref >39.00)
LDL Cholesterol: 111 mg/dL — ABNORMAL HIGH (ref 0–99)
NonHDL: 121.93
Total CHOL/HDL Ratio: 2
Triglycerides: 54 mg/dL (ref 0.0–149.0)
VLDL: 10.8 mg/dL (ref 0.0–40.0)

## 2019-02-21 LAB — COMPREHENSIVE METABOLIC PANEL
ALT: 28 U/L (ref 0–35)
AST: 27 U/L (ref 0–37)
Albumin: 4.7 g/dL (ref 3.5–5.2)
Alkaline Phosphatase: 57 U/L (ref 39–117)
BUN: 17 mg/dL (ref 6–23)
CO2: 31 mEq/L (ref 19–32)
Calcium: 9.8 mg/dL (ref 8.4–10.5)
Chloride: 100 mEq/L (ref 96–112)
Creatinine, Ser: 0.83 mg/dL (ref 0.40–1.20)
GFR: 67.63 mL/min (ref >60.00)
Glucose, Bld: 113 mg/dL — ABNORMAL HIGH (ref 70–99)
Potassium: 4.2 mEq/L (ref 3.5–5.1)
Sodium: 140 mEq/L (ref 135–145)
Total Bilirubin: 0.7 mg/dL (ref 0.2–1.2)
Total Protein: 7.2 g/dL (ref 6.0–8.3)

## 2019-02-21 LAB — T4, FREE: Free T4: 0.88 ng/dL (ref 0.60–1.60)

## 2019-02-21 LAB — TSH: TSH: 2.34 u[IU]/mL (ref 0.35–4.50)

## 2019-02-21 MED ORDER — METOPROLOL-HYDROCHLOROTHIAZIDE 50-25 MG PO TABS: 1.0000 | ORAL_TABLET | Freq: Every day | ORAL | 3 refills | Status: DC

## 2019-02-21 MED ORDER — ROSUVASTATIN CALCIUM 20 MG PO TABS: 20.0000 mg | ORAL_TABLET | Freq: Every day | ORAL | 3 refills | Status: DC

## 2019-02-21 NOTE — Telephone Encounter (Signed)
Called patient to r/s here appt per her teamhealth form that she has been exposed to Covid.

## 2019-02-21 NOTE — Patient Instructions (Signed)
Please return in 3 months for blood pressure and cholesterol recheck. Please come fasting if possible.   We are changing a few of your medications for blood pressure and cholesterol control today.  Please schedule your mammogram.   It was a pleasure meeting you today! Thank you for choosing [place] to meet your healthcare needs! I truly look forward to working with you. If you have any questions or concerns, please send me a message via Mychart or call the office at [phone].

## 2019-02-21 NOTE — Progress Notes (Signed)
Subjective  Chief Complaint  Patient presents with  . Annual Exam  . Hypertension  . Hashimoto's Thyroiditis  . Hyperlipidemia    HPI: [name] is a 74 y.o. female who presents to [facility] today for a Female Wellness Visit. She also has the concerns and/or needs as listed above in the chief complaint. These will be addressed in addition to the Health Maintenance Visit. 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  She is married and has 1 grown son who lives in the area.  She has 2 grandchildren and enjoys them.  She is originally from [place].  Overall she is very healthy and active.  Health maintenance: She is due for mammogram and would like to defer this to the spring due to Covid.  Bone density screens are up-to-date with her most recent being normal.  Immunizations are up-to-date.  Eats well. Chronic disease f/u and/or acute problem visit: (deemed necessary to be done in addition to the wellness visit):  Chronic hypertension that has been fairly well controlled although it is trending upwards.  She does check her blood pressure at home and it averages 130s over 80s.  Today it is mildly elevated.  She has been on hydrochlorothiazide for many years.  No known coronary artery disease or heart failure.  No chest pain shortness of breath or lower extremity edema.  She has had symptomatic palpitations for some time that became worse.  Recently she was evaluated by cardiology and found to have ectopic atrial tachycardia.  She feels fluttering in the chest at times but nothing prolonged and without associated symptoms.  She did have a negative stress test a few years back.  No known coronary disease.  Cardiology recommend doing monitoring and treat if becomes more symptomatic.  History of  Hashimoto's thyroiditis, asymptomatic and euthyroid.  Has been followed by endocrine.  She denies symptoms of hyper or hypothyroidism at this time.  I reviewed her consult notes from endocrinology and lab work.  She has chronic allergies that are controlled with medications  Mixed hyperlipidemia on a statin with most recent LDL levels above goal.  She takes Zocor 40 mg nightly without adverse effects.  She has a strong history of hypertension and strokes, 3 of her 5 sisters have had CVAs.  They were also smokers.  Due to this family history, history of hypertension hyperlipidemia would prefer to push LDL goal to less than 80.  History of GERD, currently asymptomatic.  As needed meds if needed.  Assessment  1. Annual physical exam   2. Essential hypertension   3. Ectopic atrial tachycardia (HCC)   4. Seasonal allergies   5. Hashimoto's thyroiditis   6. Mixed hyperlipidemia   7. Gastroesophageal reflux disease without esophagitis      Plan  Female Wellness Visit:  Age appropriate Health Maintenance and Prevention measures were discussed with patient. Included topics are cancer screening recommendations, ways to keep healthy (see AVS) including dietary and exercise recommendations, regular eye and dental care, use of seat belts, and avoidance of moderate alcohol use and tobacco use.  Mammogram to be scheduled.  BMI: discussed patient's BMI and encouraged positive lifestyle modifications to help get to or maintain a target BMI.  HM needs and immunizations were addressed and ordered. See below for  orders. See HM and immunization section for updates.  Up-to-date  Routine labs and screening tests ordered including cmp, cbc and lipids where appropriate.  Discussed recommendations regarding Vit D and calcium supplementation (see AVS)  Chronic disease management visit and/or acute problem visit:  Hypertension with only fair control.  Discussed goals of treatment.  Would like blood pressures  120s over 70s consistently.  Add beta-blocker to HCTZ and recheck in 3 months.  Recheck renal and electrolytes at this time  Ectopic atrial tachycardia with symptomatic palpitations: Mild by patient report.  Benign evaluation by cardiology.  Add low-dose beta-blocker to help with hypertension control and symptomatic palpitations.  Hyperlipidemia due for recheck.  If LDL remains above 100, would change to Crestor.  Patient understands and agrees.  Hashimoto's thyroiditis: Has been stable.  We will continue to monitor for signs or symptoms of hypothyroidism or chemical hypothyroidism.  Allergies and GERD are well controlled.  Follow up: No follow-ups on file.  Orders Placed This Encounter  Procedures  . CBC w/Diff  . CMP  . Lipids  . TSH  . T3  . T4, free   Meds ordered this encounter  Medications  . rosuvastatin (CRESTOR) 20 MG tablet    Sig: Take 1 tablet (20 mg total) by mouth daily.    Dispense:  90 tablet    Refill:  3  . metoprolol-hydrochlorothiazide (LOPRESSOR HCT) 50-25 MG tablet    Sig: Take 1 tablet by mouth daily.    Dispense:  90 tablet    Refill:  3      Lifestyle: Body mass index is 27.47 kg/m. Wt Readings from Last 3 Encounters:  02/21/19 150 lb 3.2 oz (68.1 kg)  11/19/18 150 lb 8 oz (68.3 kg)  08/15/18 146 lb (66.2 kg)   Diet: general Exercise: intermittently,   Patient Active Problem List   Diagnosis Date Noted  . Ectopic atrial tachycardia (HCC) 02/21/2019  . Hashimoto's thyroiditis 11/17/2017    Lab Results  Component Value Date   TSH 2.17 11/17/2017   FREET4 0.87 11/17/2017    Lab Results  Component Value Date   TSH 2.17 11/17/2017   TSH 2.72 09/08/2017   TSH 5.20 (H) 11/21/2016   TSH 3.813 01/29/2015   FREET4 0.87 11/17/2017   FREET4 0.83 11/21/2016      . Seasonal allergies   . GERD (gastroesophageal reflux disease)   . Vitamin D deficiency 03/06/2015  . Mixed hyperlipidemia   . Essential hypertension 01/14/2013   Health  Maintenance  Topic Date Due  . MAMMOGRAM  01/31/2019  . DEXA SCAN  04/20/2020  . TETANUS/TDAP  11/21/2023  . COLONOSCOPY  05/21/2026  . INFLUENZA VACCINE  Completed  . Hepatitis C Screening  Completed  . PNA vac Low Risk Adult  Completed   Immunization History  Administered Date(s) Administered  . Fluad Quad(high Dose 65+) 11/19/2018  . Influenza, High Dose Seasonal PF 11/21/2016, 11/17/2017  . Influenza,inj,Quad PF,6+ Mos 01/05/2016  . Pneumococcal Conjugate-13 12/26/2014  . Pneumococcal Polysaccharide-23 12/28/2012  . Tdap 11/20/2013  . Zoster Recombinat (Shingrix) 12/21/2017, 02/26/2018   We updated and reviewed the patient's past history in detail and it is documented below. Allergies: Patient has No Known Allergies. Past Medical History Patient  has a past medical history of Arthritis, Chest pain at rest (01/14/2013), Complication of anesthesia, Fracture of tibia and fibula (01/27/2015), GERD (gastroesophageal reflux disease), Hashimoto's thyroiditis (11/17/2017), High cholesterol, Hypertension, PONV (postoperative nausea and vomiting), Scopalamine controls, Seasonal allergies, Subclinical hypothyroidism (11/27/2016), Syndesmotic  disruption of right ankle (03/06/2015), and Vitamin D deficiency (03/06/2015). Past Surgical History Patient  has a past surgical history that includes ORIF tibia fracture (Right, 01/29/2015); Bunionectomy (Bilateral); Colonoscopy; Hardware Removal (Right, 12/10/2015); and Myomectomy. Family History: Patient family history includes CVA in her mother, sister, sister, and sister; Diabetes in her father; Heart attack in her sister; Heart disease in her father. Social History:  Patient  reports that she has never smoked. She has never used smokeless tobacco. She reports current alcohol use of about 3.0 - 4.0 standard drinks of alcohol per week. She reports that she does not use drugs.  Review of Systems: Constitutional: negative for fever or malaise  Ophthalmic: negative for photophobia, double vision or loss of vision Cardiovascular: negative for chest pain, dyspnea on exertion, or new LE swelling Respiratory: negative for SOB or persistent cough Gastrointestinal: negative for abdominal pain, change in bowel habits or melena Genitourinary: negative for dysuria or gross hematuria, no abnormal uterine bleeding or disharge Musculoskeletal: negative for new gait disturbance or muscular weakness Integumentary: negative for new or persistent rashes, no breast lumps Neurological: negative for TIA or stroke symptoms Psychiatric: negative for SI or delusions Allergic/Immunologic: negative for hives Endocrine: + hot flushes Patient Care Team    Relationship Specialty Notifications Start End  Leamon Arnt, MD PCP - General Family Medicine  02/21/19   Philemon Kingdom, MD Consulting Physician Internal Medicine  08/15/18   Belva Crome, MD Consulting Physician Cardiology  08/15/18     Objective  Vitals: BP (!) 144/82 (BP Location: Right Arm, Patient Position: Sitting, Cuff Size: Normal)   Pulse 60   Temp 97.9 F (36.6 C) (Temporal)   Ht 5\' 2"  (1.575 m)   Wt 150 lb 3.2 oz (68.1 kg)   SpO2 99%   BMI 27.47 kg/m  General:  Well developed, well nourished, no acute distress  Psych:  Alert and orientedx3,normal mood and affect HEENT:  Normocephalic, atraumatic, non-icteric sclera, PERRL, oropharynx is clear without mass or exudate, supple neck without adenopathy, mass or thyromegaly Cardiovascular:  Normal S1, S2, RRR without gallop, rub or murmur, nondisplaced PMI Respiratory:  Good breath sounds bilaterally, CTAB with normal respiratory effort Gastrointestinal: normal bowel sounds, soft, non-tender, no noted masses. No HSM MSK: no deformities, contusions. Joints are without erythema or swelling. Spine and CVA region are nontender Skin:  Warm, no rashes or suspicious lesions noted Neurologic:    Mental status is normal. CN 2-11 are normal.  Gross motor and sensory exams are normal. Normal gait. No tremor Breast Exam: No mass, skin retraction or nipple discharge is appreciated in either breast. No axillary adenopathy. Fibrocystic changes are not noted    Commons side effects, risks, benefits, and alternatives for medications and treatment plan prescribed today were discussed, and the patient expressed understanding of the given instructions. Patient is instructed to call or message via MyChart if he/she has any questions or concerns regarding our treatment plan. No barriers to understanding were identified. We discussed Red Flag symptoms and signs in detail. Patient expressed understanding regarding what to do in case of urgent or emergency type symptoms.   Medication list was reconciled, printed and provided to the patient in AVS. Patient instructions and summary information was reviewed with the patient as documented in the AVS. This note was prepared with assistance of Dragon voice recognition software. Occasional wrong-word or sound-a-like substitutions may have occurred due to the inherent limitations of voice recognition software  This visit occurred during the SARS-CoV-2 public  health emergency.  Safety protocols were in place, including screening questions prior to the visit, additional usage of staff PPE, and extensive cleaning of exam room while observing appropriate contact time as indicated for disinfecting solutions.

## 2019-02-22 ENCOUNTER — Other Ambulatory Visit: Payer: Self-pay

## 2019-02-22 ENCOUNTER — Other Ambulatory Visit (INDEPENDENT_AMBULATORY_CARE_PROVIDER_SITE_OTHER): Payer: Medicare PPO

## 2019-02-22 DIAGNOSIS — R7309 Other abnormal glucose: Secondary | ICD-10-CM

## 2019-02-22 DIAGNOSIS — R7301 Impaired fasting glucose: Secondary | ICD-10-CM | POA: Insufficient documentation

## 2019-02-22 LAB — HEMOGLOBIN A1C: Hgb A1c MFr Bld: 5.7 % (ref 4.6–6.5)

## 2019-02-22 LAB — T3: T3, Total: 128 ng/dL (ref 76–181)

## 2019-02-22 NOTE — Addendum Note (Signed)
Addended by: Young Berry T on: 02/22/2019 10:49 AM   Modules accepted: Orders

## 2019-02-22 NOTE — Addendum Note (Signed)
Addended by: Young Berry T on: 02/22/2019 10:45 AM   Modules accepted: Orders

## 2019-02-25 ENCOUNTER — Other Ambulatory Visit: Payer: Self-pay | Admitting: Family Medicine

## 2019-02-25 ENCOUNTER — Encounter: Payer: Self-pay | Admitting: Family Medicine

## 2019-02-25 DIAGNOSIS — D751 Secondary polycythemia: Secondary | ICD-10-CM | POA: Insufficient documentation

## 2019-02-25 DIAGNOSIS — Z1231 Encounter for screening mammogram for malignant neoplasm of breast: Secondary | ICD-10-CM

## 2019-02-26 ENCOUNTER — Ambulatory Visit
Admission: RE | Admit: 2019-02-26 | Discharge: 2019-02-26 | Disposition: A | Discharge status: Home / Self Care | Payer: Medicare PPO | Source: Ambulatory Visit

## 2019-02-26 ENCOUNTER — Other Ambulatory Visit: Payer: Self-pay

## 2019-02-26 DIAGNOSIS — Z1231 Encounter for screening mammogram for malignant neoplasm of breast: Secondary | ICD-10-CM

## 2019-03-04 ENCOUNTER — Encounter: Payer: Self-pay | Admitting: Family Medicine

## 2019-03-26 ENCOUNTER — Encounter: Payer: Self-pay | Admitting: Family Medicine

## 2019-04-08 ENCOUNTER — Ambulatory Visit: Payer: Medicare PPO

## 2019-04-12 ENCOUNTER — Other Ambulatory Visit: Payer: Self-pay

## 2019-04-12 ENCOUNTER — Ambulatory Visit (INDEPENDENT_AMBULATORY_CARE_PROVIDER_SITE_OTHER): Payer: Medicare PPO

## 2019-04-12 VITALS — BP 123/82

## 2019-04-12 DIAGNOSIS — Z0000 Encounter for general adult medical examination without abnormal findings: Secondary | ICD-10-CM | POA: Diagnosis not present

## 2019-04-12 NOTE — Patient Instructions (Signed)
[name] , Thank you for taking time to come for your Medicare Wellness Visit. I appreciate your ongoing commitment to your health goals. Please review the following plan we discussed and let me know if I can assist you in the future.   Screening recommendations/referrals: Colorectal Screening: up to date; last colonoscopy 05/20/16  Mammogram: up to date; last 02/26/19 Bone Density: up to date; last 04/21/15  Vision and Dental Exams: Recommended annual ophthalmology exams for early detection of glaucoma and other disorders of the eye Recommended annual dental exams for proper oral hygiene  Vaccinations: Influenza vaccine: completed 11/19/18 Pneumococcal vaccine: up to date; last 12/26/14 Tdap vaccine: up to date; last 11/20/13  Shingles vaccine: Shingrix completed   Advanced directives: Please bring a copy of your POA (Power of [place]) and/or Living Will to your next appointment.  Goals: Recommend to drink at least 6-8 8oz glasses of water per day and consume a balanced diet rich in fresh fruits and vegetables.   Next appointment: Please schedule your Annual Wellness Visit with your Nurse Health Advisor in one year.  Preventive Care 41 Years and Older, Female Preventive care refers to lifestyle choices and visits with your health care provider that can promote health and wellness. What does preventive care include?  A yearly physical exam. This is also called an annual well check.  Dental exams once or twice a year.  Routine eye exams. Ask your health care provider how often you should have your eyes checked.  Personal lifestyle choices, including:  Daily care of your teeth and gums.  Regular physical activity.  Eating a healthy diet.  Avoiding tobacco and drug use.  Limiting alcohol use.  Practicing safe sex.  Taking low-dose aspirin every day if recommended by your health care provider.  Taking vitamin and mineral supplements as recommended by your health care  provider. What happens during an annual well check? The services and screenings done by your health care provider during your annual well check will depend on your age, overall health, lifestyle risk factors, and family history of disease. Counseling  Your health care provider may ask you questions about your:  Alcohol use.  Tobacco use.  Drug use.  Emotional well-being.  Home and relationship well-being.  Sexual activity.  Eating habits.  History of falls.  Memory and ability to understand (cognition).  Work and work Astronomer.  Reproductive health. Screening  You may have the following tests or measurements:  Height, weight, and BMI.  Blood pressure.  Lipid and cholesterol levels. These may be checked every 5 years, or more frequently if you are over 73 years old.  Skin check.  Lung cancer screening. You may have this screening every year starting at age 67 if you have a 30-pack-year history of smoking and currently smoke or have quit within the past 15 years.  Fecal occult blood test (FOBT) of the stool. You may have this test every year starting at age 58.  Flexible sigmoidoscopy or colonoscopy. You may have a sigmoidoscopy every 5 years or a colonoscopy every 10 years starting at age 65.  Hepatitis C blood test.  Hepatitis B blood test.  Sexually transmitted disease (STD) testing.  Diabetes screening. This is done by checking your blood sugar (glucose) after you have not eaten for a while (fasting). You may have this done every 1-3 years.  Bone density scan. This is done to screen for osteoporosis. You may have this done starting at age 38.  Mammogram. This may be  done every 1-2 years. Talk to your health care provider about how often you should have regular mammograms. Talk with your health care provider about your test results, treatment options, and if necessary, the need for more tests. Vaccines  Your health care provider may recommend certain  vaccines, such as:  Influenza vaccine. This is recommended every year.  Tetanus, diphtheria, and acellular pertussis (Tdap, Td) vaccine. You may need a Td booster every 10 years.  Zoster vaccine. You may need this after age 42.  Pneumococcal 13-valent conjugate (PCV13) vaccine. One dose is recommended after age 92.  Pneumococcal polysaccharide (PPSV23) vaccine. One dose is recommended after age 69. Talk to your health care provider about which screenings and vaccines you need and how often you need them. This information is not intended to replace advice given to you by your health care provider. Make sure you discuss any questions you have with your health care provider. Document Released: 02/27/2015 Document Revised: 10/21/2015 Document Reviewed: 12/02/2014 Elsevier Interactive Patient Education  2017 [place] Prevention in the Home Falls can cause injuries. They can happen to people of all ages. There are many things you can do to make your home safe and to help prevent falls. What can I do on the outside of my home?  Regularly fix the edges of walkways and driveways and fix any cracks.  Remove anything that might make you trip as you walk through a door, such as a raised step or threshold.  Trim any bushes or trees on the path to your home.  Use bright outdoor lighting.  Clear any walking paths of anything that might make someone trip, such as rocks or tools.  Regularly check to see if handrails are loose or broken. Make sure that both sides of any steps have handrails.  Any raised decks and porches should have guardrails on the edges.  Have any leaves, snow, or ice cleared regularly.  Use sand or salt on walking paths during winter.  Clean up any spills in your garage right away. This includes oil or grease spills. What can I do in the bathroom?  Use night lights.  Install grab bars by the toilet and in the tub and shower. Do not use towel bars as grab  bars.  Use non-skid mats or decals in the tub or shower.  If you need to sit down in the shower, use a plastic, non-slip stool.  Keep the floor dry. Clean up any water that spills on the floor as soon as it happens.  Remove soap buildup in the tub or shower regularly.  Attach bath mats securely with double-sided non-slip rug tape.  Do not have throw rugs and other things on the floor that can make you trip. What can I do in the bedroom?  Use night lights.  Make sure that you have a light by your bed that is easy to reach.  Do not use any sheets or blankets that are too big for your bed. They should not hang down onto the floor.  Have a firm chair that has side arms. You can use this for support while you get dressed.  Do not have throw rugs and other things on the floor that can make you trip. What can I do in the kitchen?  Clean up any spills right away.  Avoid walking on wet floors.  Keep items that you use a lot in easy-to-reach places.  If you need to reach something above you, use  a strong step stool that has a grab bar.  Keep electrical cords out of the way.  Do not use floor polish or wax that makes floors slippery. If you must use wax, use non-skid floor wax.  Do not have throw rugs and other things on the floor that can make you trip. What can I do with my stairs?  Do not leave any items on the stairs.  Make sure that there are handrails on both sides of the stairs and use them. Fix handrails that are broken or loose. Make sure that handrails are as long as the stairways.  Check any carpeting to make sure that it is firmly attached to the stairs. Fix any carpet that is loose or worn.  Avoid having throw rugs at the top or bottom of the stairs. If you do have throw rugs, attach them to the floor with carpet tape.  Make sure that you have a light switch at the top of the stairs and the bottom of the stairs. If you do not have them, ask someone to add them for  you. What else can I do to help prevent falls?  Wear shoes that:  Do not have high heels.  Have rubber bottoms.  Are comfortable and fit you well.  Are closed at the toe. Do not wear sandals.  If you use a stepladder:  Make sure that it is fully opened. Do not climb a closed stepladder.  Make sure that both sides of the stepladder are locked into place.  Ask someone to hold it for you, if possible.  Clearly mark and make sure that you can see:  Any grab bars or handrails.  First and last steps.  Where the edge of each step is.  Use tools that help you move around (mobility aids) if they are needed. These include:  Canes.  Walkers.  Scooters.  Crutches.  Turn on the lights when you go into a dark area. Replace any light bulbs as soon as they burn out.  Set up your furniture so you have a clear path. Avoid moving your furniture around.  If any of your floors are uneven, fix them.  If there are any pets around you, be aware of where they are.  Review your medicines with your doctor. Some medicines can make you feel dizzy. This can increase your chance of falling. Ask your doctor what other things that you can do to help prevent falls. This information is not intended to replace advice given to you by your health care provider. Make sure you discuss any questions you have with your health care provider. Document Released: 11/27/2008 Document Revised: 07/09/2015 Document Reviewed: 03/07/2014 Elsevier Interactive Patient Education  2017 [employer].

## 2019-04-12 NOTE — Progress Notes (Signed)
This visit is being conducted via phone call due to the COVID-19 pandemic. This patient has given me verbal consent via phone to conduct this visit, patient states they are participating from their home address. Some vital signs may be absent or patient reported.   Patient identification: identified by name, DOB, and current address.  Location provider: [facility], Office Persons participating in the virtual visit: Kandis Fantasia LPN, patient, and Dr. Asencion Partridge   Subjective:   [name] is a 74 y.o. female who presents for Medicare Annual (Subsequent) preventive examination.  Review of Systems:   Cardiac Risk Factors include: advanced age (>60men, >14 women);hypertension;dyslipidemia    Objective:     Vitals: BP 123/82 Comment: patient reported  There is no height or weight on file to calculate BMI.  Advanced Directives 04/12/2019 02/28/2017 12/10/2015 01/27/2015  Does Patient Have a Medical Advance Directive? Yes Yes Yes No  Type of Advance Directive Living will;Healthcare Power of Attorney Living will;Healthcare Power of [employer] Power of [place];Living will -  Does patient want to make changes to medical advance directive? No - Patient declined No - Patient declined No - Patient declined -  Copy of Healthcare Power of Attorney in Chart? No - copy requested No - copy requested No - copy requested -    Tobacco Social History   Tobacco Use  Smoking Status Never Smoker  Smokeless Tobacco Never Used     Counseling given: Not Answered   Clinical Intake:  Pre-visit preparation completed: Yes  Pain : No/denies pain  Diabetes: No  How often do you need to have someone help you when you read instructions, pamphlets, or other written materials from your doctor or pharmacy?: 1 - Never  Interpreter Needed?: No  Information entered by :: Kandis Fantasia LPN  Past Medical History:  Diagnosis Date  . Arthritis   . Chest pain at rest 01/14/2013  .  Complication of anesthesia   . Fracture of tibia and fibula 01/27/2015  . GERD (gastroesophageal reflux disease)   . Hashimoto's thyroiditis 11/17/2017   Lab Results Component Value Date  TSH 2.17 11/17/2017  FREET4 0.87 11/17/2017   Lab Results Component Value Date  TSH 2.17 11/17/2017  TSH 2.72 09/08/2017  TSH 5.20 (H) 11/21/2016  TSH 3.813 01/29/2015  FREET4 0.87 11/17/2017  FREET4 0.83 11/21/2016    . High cholesterol   . Hypertension   . PONV (postoperative nausea and vomiting), Scopalamine controls   . Seasonal allergies   . 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 Syndesmotic disruption of right ankle 03/06/2015  . Vitamin D deficiency 03/06/2015   Past Surgical History:  Procedure Laterality Date  . BUNIONECTOMY Bilateral   . COLONOSCOPY    . HARDWARE REMOVAL Right 12/10/2015   Procedure: HARDWARE REMOVAL RIGHT ANKLE;  Surgeon: Myrene Galas, MD;  Location: [facility] OR;  Service: Orthopedics;  Laterality: Right;  . MYOMECTOMY    . ORIF TIBIA FRACTURE Right 01/29/2015   Procedure: OPEN REDUCTION INTERNAL FIXATION (ORIF) RIGHT TIBIA  AND FIBULA FRACTURES;  Surgeon: Myrene Galas, MD;  Location: [facility] OR;  Service: Orthopedics;  Laterality: Right;   Family History  Problem Relation Age of Onset  . CVA Mother   . Heart disease Father   . Diabetes Father   . Heart attack Sister   . CVA Sister   . CVA Sister   . CVA Sister    Social History   Socioeconomic History  . Marital status: Married    Spouse name: Not  on file  . Number of children: Not on file  . Years of education: Not on file  . Highest education level: Not on file  Occupational History  . Occupation: Retired Runner, broadcasting/film/video  Tobacco Use  . Smoking status: Never Smoker  . Smokeless tobacco: Never Used  Substance and Sexual Activity  . Alcohol use: Yes    Alcohol/week: 3.0 - 4.0 standard drinks    Types: 3 - 4 Glasses of wine per week  . Drug use: No  . Sexual activity: Yes    Birth control/protection: Post-menopausal    Other Topics Concern  . Not on file  Social History Narrative  . Not on file   Social Determinants of Health   Financial Resource Strain:   . Difficulty of Paying Living Expenses: Not on file  Food Insecurity:   . Worried About Programme researcher, broadcasting/film/video in the Last Year: Not on file  . Ran Out of Food in the Last Year: Not on file  Transportation Needs:   . Lack of Transportation (Medical): Not on file  . Lack of Transportation (Non-Medical): Not on file  Physical Activity:   . Days of Exercise per Week: Not on file  . Minutes of Exercise per Session: Not on file  Stress:   . Feeling of Stress : Not on file  Social Connections:   . Frequency of Communication with Friends and Family: Not on file  . Frequency of Social Gatherings with Friends and Family: Not on file  . Attends Religious Services: Not on file  . Active Member of Clubs or Organizations: Not on file  . Attends Banker Meetings: Not on file  . Marital Status: Not on file    Outpatient Encounter Medications as of 04/12/2019  Medication Sig  . aspirin 81 MG tablet Take 81 mg by mouth daily.  . cholecalciferol (VITAMIN D3) 25 MCG (1000 UT) tablet Take by mouth daily.   . Coenzyme Q10 (COQ10) 100 MG CAPS Take 100 mg by mouth daily.  . diphenhydrAMINE (BENADRYL) 25 MG tablet Take 25 mg by mouth as needed for allergies.   . fluticasone (FLONASE) 50 MCG/ACT nasal spray Place 2 sprays into both nostrils as needed for allergies or rhinitis.  Marland Kitchen ketoconazole (NIZORAL) 2 % cream Apply to affected area 1-2 times daily  . Ketotifen Fumarate (ALLERGY EYE DROPS OP) Apply 1 drop to eye daily as needed (allergies).  . metoprolol-hydrochlorothiazide (LOPRESSOR HCT) 50-25 MG tablet Take 1 tablet by mouth daily.  . Multiple Vitamin (MULTIVITAMIN) tablet Take 1 tablet by mouth daily.  . Omega-3 Fatty Acids (FISH OIL) 1000 MG CAPS Take by mouth daily.  . rosuvastatin (CRESTOR) 20 MG tablet Take 1 tablet (20 mg total) by mouth  daily.  . Selenium 200 MCG CAPS Take 1 capsule by mouth daily.  Marland Kitchen triamcinolone cream (KENALOG) 0.1 % Apply to affected area 1-2 times daily  . TURMERIC PO Take 1,000 mg by mouth 2 (two) times daily.  . vitamin C (VITAMIN C) 500 MG tablet Take 1 tablet (500 mg total) by mouth daily.   No facility-administered encounter medications on file as of 04/12/2019.    Activities of Daily Living In your present state of health, do you have any difficulty performing the following activities: 04/12/2019  Hearing? N  Vision? N  Difficulty concentrating or making decisions? N  Walking or climbing stairs? N  Dressing or bathing? N  Doing errands, shopping? N  Preparing Food and eating ? N  Using  the Toilet? N  In the past six months, have you accidently leaked urine? N  Do you have problems with loss of bowel control? N  Managing your Medications? N  Managing your Finances? N  Housekeeping or managing your Housekeeping? N  Some recent data might be hidden    Patient Care Team: Leamon Arnt, MD as PCP - General (Family Medicine) Philemon Kingdom, MD as Consulting Physician (Internal Medicine) Belva Crome, MD as Consulting Physician (Cardiology)    Assessment:   This is a routine wellness examination for [name].  Exercise Activities and Dietary recommendations Current Exercise Habits: Home exercise routine, Type of exercise: walking, Time (Minutes): 60, Frequency (Times/Week): 6, Weekly Exercise (Minutes/Week): 360, Intensity: Moderate  Goals    . Eat better     Decrease carbs and starches.        Fall Risk Fall Risk  04/12/2019 02/21/2019 08/15/2018 02/28/2017 05/09/2016  Falls in the past year? 0 0 0 No No  Number falls in past yr: 0 - - - -  Injury with Fall? 0 - - - -  Follow up Falls evaluation completed;Education provided;Falls prevention discussed Falls evaluation completed - - -   Is the patient's home free of loose throw rugs in walkways, pet beds, electrical cords, etc?    yes      Grab bars in the bathroom? yes      Handrails on the stairs?   yes      Adequate lighting?   yes  Depression Screen PHQ 2/9 Scores 04/12/2019 08/15/2018 02/28/2017 05/09/2016  PHQ - 2 Score 0 0 0 0  PHQ- 9 Score - 1 0 -     Cognitive Function- no cognitive concerns at this time    6CIT Screen 04/12/2019  What Year? 0 points  What month? 0 points  What time? 0 points  Count back from 20 0 points  Months in reverse 0 points  Repeat phrase 0 points  Total Score 0    Immunization History  Administered Date(s) Administered  . Fluad Quad(high Dose 65+) 11/19/2018  . Influenza, High Dose Seasonal PF 11/21/2016, 11/17/2017  . Influenza,inj,Quad PF,6+ Mos 01/05/2016  . Pneumococcal Conjugate-13 12/26/2014  . Pneumococcal Polysaccharide-23 12/28/2012  . Tdap 11/20/2013  . Zoster Recombinat (Shingrix) 12/21/2017, 02/26/2018    Qualifies for Shingles Vaccine? Shingrix completed   Screening Tests Health Maintenance  Topic Date Due  . MAMMOGRAM  02/26/2020  . DEXA SCAN  04/20/2020  . TETANUS/TDAP  11/21/2023  . COLONOSCOPY  05/21/2026  . INFLUENZA VACCINE  Completed  . Hepatitis C Screening  Completed  . PNA vac Low Risk Adult  Completed    Cancer Screenings: Lung: Low Dose CT Chest recommended if Age 49-80 years, 30 pack-year currently smoking OR have quit w/in 15years. Patient does not qualify. Breast:  Up to date on Mammogram? Yes   Up to date of Bone Density/Dexa? Yes Colorectal: colonoscopy 05/20/16    Plan:  I have personally reviewed and addressed the Medicare Annual Wellness questionnaire and have noted the following in the patient's chart:  A. Medical and social history B. Use of alcohol, tobacco or illicit drugs  C. Current medications and supplements D. Functional ability and status E.  Nutritional status F.  Physical activity G. Advance directives H. List of other physicians I.  Hospitalizations, surgeries, and ER visits in previous 12 months J.   [place] such as hearing and vision if needed, cognitive and depression L. Referrals, records requested, and  appointments- none   In addition, I have reviewed and discussed with patient certain preventive protocols, quality metrics, and best practice recommendations. A written personalized care plan for preventive services as well as general preventive health recommendations were provided to patient.   Signed,  Kandis Fantasia, LPN  Nurse Health Advisor   Nurse Notes: Patient has completed Covid vaccine

## 2019-05-22 ENCOUNTER — Other Ambulatory Visit: Payer: Self-pay

## 2019-05-22 ENCOUNTER — Ambulatory Visit (INDEPENDENT_AMBULATORY_CARE_PROVIDER_SITE_OTHER): Payer: Medicare PPO | Admitting: Family Medicine

## 2019-05-22 ENCOUNTER — Encounter: Payer: Self-pay | Admitting: Family Medicine

## 2019-05-22 VITALS — BP 128/78 | HR 56 | Temp 97.2°F | Resp 18 | Ht 62.0 in | Wt 149.6 lb

## 2019-05-22 DIAGNOSIS — E782 Mixed hyperlipidemia: Secondary | ICD-10-CM | POA: Diagnosis not present

## 2019-05-22 DIAGNOSIS — I471 Supraventricular tachycardia: Secondary | ICD-10-CM | POA: Diagnosis not present

## 2019-05-22 DIAGNOSIS — I10 Essential (primary) hypertension: Secondary | ICD-10-CM | POA: Diagnosis not present

## 2019-05-22 DIAGNOSIS — D751 Secondary polycythemia: Secondary | ICD-10-CM | POA: Diagnosis not present

## 2019-05-22 LAB — URINALYSIS, ROUTINE W REFLEX MICROSCOPIC
Bilirubin Urine: NEGATIVE
Ketones, ur: NEGATIVE
Leukocytes,Ua: NEGATIVE
Nitrite: NEGATIVE
RBC / HPF: NONE SEEN (ref 0–?)
Specific Gravity, Urine: >=1.03 — AB (ref 1.000–1.030)
Total Protein, Urine: NEGATIVE
Urine Glucose: NEGATIVE
Urobilinogen, UA: 0.2 (ref 0.0–1.0)
WBC, UA: NONE SEEN (ref 0–?)
pH: 5.5 (ref 5.0–8.0)

## 2019-05-22 LAB — LIPID PANEL
Cholesterol: 203 mg/dL — ABNORMAL HIGH (ref 0–200)
HDL: 93.5 mg/dL (ref >39.00)
LDL Cholesterol: 97 mg/dL (ref 0–99)
NonHDL: 109.8
Total CHOL/HDL Ratio: 2
Triglycerides: 63 mg/dL (ref 0.0–149.0)
VLDL: 12.6 mg/dL (ref 0.0–40.0)

## 2019-05-22 LAB — CBC WITH DIFFERENTIAL/PLATELET
Basophils Absolute: 0 10*3/uL (ref 0.0–0.1)
Basophils Relative: 0.9 % (ref 0.0–3.0)
Eosinophils Absolute: 0.1 10*3/uL (ref 0.0–0.7)
Eosinophils Relative: 1.9 % (ref 0.0–5.0)
HCT: 47.2 % — ABNORMAL HIGH (ref 36.0–46.0)
Hemoglobin: 16.3 g/dL — ABNORMAL HIGH (ref 12.0–15.0)
Lymphocytes Relative: 33.3 % (ref 12.0–46.0)
Lymphs Abs: 1.3 10*3/uL (ref 0.7–4.0)
MCHC: 34.6 g/dL (ref 30.0–36.0)
MCV: 92 fl (ref 78.0–100.0)
Monocytes Absolute: 0.3 10*3/uL (ref 0.1–1.0)
Monocytes Relative: 7.4 % (ref 3.0–12.0)
Neutro Abs: 2.2 10*3/uL (ref 1.4–7.7)
Neutrophils Relative %: 56.5 % (ref 43.0–77.0)
Platelets: 215 10*3/uL (ref 150.0–400.0)
RBC: 5.13 Mil/uL — ABNORMAL HIGH (ref 3.87–5.11)
RDW: 13.4 % (ref 11.5–15.5)
WBC: 4 10*3/uL (ref 4.0–10.5)

## 2019-05-22 LAB — SEDIMENTATION RATE: Sed Rate: 7 mm/hr (ref 0–30)

## 2019-05-22 MED ORDER — METOPROLOL-HYDROCHLOROTHIAZIDE 50-25 MG PO TABS: 1.0000 | ORAL_TABLET | Freq: Every day | ORAL | 3 refills | Status: DC

## 2019-05-22 NOTE — Progress Notes (Signed)
Subjective  CC:  Chief Complaint  Patient presents with  . Hypertension    She would like to to discuss polycythemia  . Hyperlipidemia    HPI: [name] is a 74 y.o. female who presents to the office today to address the problems listed above in the chief complaint.  Hypertension f/u: we added BB to hctz/amlodipine at last visit. Control is good . Pt reports she is doing well. taking medications as instructed, no medication side effects noted, no TIAs, no chest pain on exertion, no dyspnea on exertion, no swelling of ankles. Home readings now consistently 120s/70s. Having less palpitations as well. Compliance with medication is good. However, noting feeling more hungry and like her blood sugar is dropping in the am. She typically eats breakfast and then takes a walk for an hour; noting during her walk that she often feels shaky and "hungry" ... having to go back to get something to eat which corrects the feeling. No sweats, CP, SOB - ? Related to BB. Denies lightheadedness, increased fatigue.  HLD: we increased crestor dose to 20mg  from 10mg  due to persistent elevated LDL in January. Doing well w/ change. Fasting for labs today.  Elevated hgb: chart reviewed; present in 2016, resolved in 2017 then returned and has persisted. Never has been mentioned to patient nor evaluated. Feels fine. No SOB. Remote h/o smoking, none for decades. No ischemic cardiopulmonary disease. No gross hematuria.   Assessment  1. Essential hypertension   2. Mixed hyperlipidemia   3. Ectopic atrial tachycardia (HCC)   4. Polycythemia      Plan    Hypertension f/u: BP control is well controlled. However, ? Related to institution of BB. Hold BB and monitor for resolution of sxs. Could consider CCB if needed.   Hyperlipidemia f/u: recheck fasting lipids and lfts with crestor dose increase  Polycythemia: check labs, serum EPO and UA and refer to hematology for further evaluation. Counseling and education  given.   Education regarding management of these chronic disease states was given. Management strategies discussed on successive visits include dietary and exercise recommendations, goals of achieving and maintaining IBW, and lifestyle modifications aiming for adequate sleep and minimizing stressors.   Follow up: 6 months f/u htn  Orders Placed This Encounter  Procedures  . CBC with Differential/Platelet  . Iron, TIBC and Ferritin Panel  . Sedimentation rate  . Lipid panel  . Urinalysis, Routine w reflex microscopic  . Erythropoietin  . Ambulatory referral to Hematology   Meds ordered this encounter  Medications  . metoprolol-hydrochlorothiazide (LOPRESSOR HCT) 50-25 MG tablet    Sig: Take 1 tablet by mouth daily. Hold x 2 weeks    Dispense:  90 tablet    Refill:  3      BP Readings from Last 3 Encounters:  05/22/19 128/78  04/12/19 123/82  02/21/19 (!) 144/82   Wt Readings from Last 3 Encounters:  05/22/19 149 lb 9.6 oz (67.9 kg)  02/21/19 150 lb 3.2 oz (68.1 kg)  11/19/18 150 lb 8 oz (68.3 kg)    Lab Results  Component Value Date   CHOL 220 (H) 02/21/2019   CHOL 204 (H) 08/15/2018   CHOL 202 (H) 09/08/2017   Lab Results  Component Value Date   HDL 97.70 02/21/2019   HDL 92.00 08/15/2018   HDL 79.60 09/08/2017   Lab Results  Component Value Date   LDLCALC 111 (H) 02/21/2019   LDLCALC 102 (H) 08/15/2018   LDLCALC 111 (H) 09/08/2017  Lab Results  Component Value Date   TRIG 54.0 02/21/2019   TRIG 53.0 08/15/2018   TRIG 60.0 09/08/2017   Lab Results  Component Value Date   CHOLHDL 2 02/21/2019   CHOLHDL 2 08/15/2018   CHOLHDL 3 09/08/2017   No results found for: LDLDIRECT Lab Results  Component Value Date   CREATININE 0.83 02/21/2019   BUN 17 02/21/2019   NA 140 02/21/2019   K 4.2 02/21/2019   CL 100 02/21/2019   CO2 31 02/21/2019    The 10-year ASCVD risk score Denman George DC Jr., et al., 2013) is: 13.2%   Values used to calculate the score:      Age: 33 years     Sex: Female     Is Non-Hispanic African American: No     Diabetic: No     Tobacco smoker: No     Systolic Blood Pressure: 128 mmHg     Is BP treated: Yes     HDL Cholesterol: 97.7 mg/dL     Total Cholesterol: 220 mg/dL  Lab Results  Component Value Date   WBC 4.9 02/21/2019   HGB 16.5 (H) 02/21/2019   HCT 48.7 (H) 02/21/2019   MCV 92.9 02/21/2019   PLT 249.0 02/21/2019   Lab Results  Component Value Date   TSH 2.34 02/21/2019    I reviewed the patients updated PMH, FH, and SocHx.    Patient Active Problem List   Diagnosis Date Noted  . Polycythemia 02/25/2019  . IFG (impaired fasting glucose) 02/22/2019  . Ectopic atrial tachycardia (HCC) 02/21/2019  . Hashimoto's thyroiditis 11/17/2017  . Seasonal allergies   . GERD (gastroesophageal reflux disease)   . Vitamin D deficiency 03/06/2015  . Mixed hyperlipidemia   . Essential hypertension 01/14/2013    Allergies: Patient has no known allergies.  Social History: Patient  reports that she has never smoked. She has never used smokeless tobacco. She reports current alcohol use of about 3.0 - 4.0 standard drinks of alcohol per week. She reports that she does not use drugs.  Current Meds  Medication Sig  . aspirin 81 MG tablet Take 81 mg by mouth daily.  . cholecalciferol (VITAMIN D3) 25 MCG (1000 UT) tablet Take by mouth daily.   . Coenzyme Q10 (COQ10) 100 MG CAPS Take 100 mg by mouth daily.  . fluticasone (FLONASE) 50 MCG/ACT nasal spray Place 2 sprays into both nostrils as needed for allergies or rhinitis.  Marland Kitchen Ketotifen Fumarate (ALLERGY EYE DROPS OP) Apply 1 drop to eye daily as needed (allergies).  . metoprolol-hydrochlorothiazide (LOPRESSOR HCT) 50-25 MG tablet Take 1 tablet by mouth daily. Hold x 2 weeks  . Multiple Vitamin (MULTIVITAMIN) tablet Take 1 tablet by mouth daily.  . Omega-3 Fatty Acids (FISH OIL) 1000 MG CAPS Take by mouth daily.  . rosuvastatin (CRESTOR) 20 MG tablet Take 1 tablet (20  mg total) by mouth daily.  . Selenium 200 MCG CAPS Take 1 capsule by mouth daily.  . TURMERIC PO Take 1,000 mg by mouth 2 (two) times daily.  . vitamin C (VITAMIN C) 500 MG tablet Take 1 tablet (500 mg total) by mouth daily.  . [DISCONTINUED] metoprolol-hydrochlorothiazide (LOPRESSOR HCT) 50-25 MG tablet Take 1 tablet by mouth daily.    Review of Systems: Cardiovascular: negative for chest pain, palpitations, leg swelling, orthopnea Respiratory: negative for SOB, wheezing or persistent cough Gastrointestinal: negative for abdominal pain Genitourinary: negative for dysuria or gross hematuria  Objective  Vitals: BP 128/78   Pulse Marland Kitchen)  56   Temp (!) 97.2 F (36.2 C) (Temporal)   Resp 18   Ht 5\' 2"  (1.575 m)   Wt 149 lb 9.6 oz (67.9 kg)   SpO2 98%   BMI 27.36 kg/m  General: no acute distress  Psych:  Alert and oriented, normal mood and affect HEENT:  Normocephalic, atraumatic, supple neck  Cardiovascular:  RRR without murmur. no edema Respiratory:  Good breath sounds bilaterally, CTAB with normal respiratory effort Skin:  Warm, no rashes Neurologic:   Mental status is normal  Commons side effects, risks, benefits, and alternatives for medications and treatment plan prescribed today were discussed, and the patient expressed understanding of the given instructions. Patient is instructed to call or message via MyChart if he/she has any questions or concerns regarding our treatment plan. No barriers to understanding were identified. We discussed Red Flag symptoms and signs in detail. Patient expressed understanding regarding what to do in case of urgent or emergency type symptoms.   Medication list was reconciled, printed and provided to the patient in AVS. Patient instructions and summary information was reviewed with the patient as documented in the AVS. This note was prepared with assistance of Dragon voice recognition software. Occasional wrong-word or sound-a-like substitutions may  have occurred due to the inherent limitations of voice recognition software  This visit occurred during the SARS-CoV-2 public health emergency.  Safety protocols were in place, including screening questions prior to the visit, additional usage of staff PPE, and extensive cleaning of exam room while observing appropriate contact time as indicated for disinfecting solutions.

## 2019-05-22 NOTE — Patient Instructions (Signed)
Please return in 6 months for hypertension follow up.  Please hold the combo metoprolol/hctz for 2 weeks or so to see if your symptoms improve. Continue the hctz in the meantime. Send me a mychart message to let me know how you do.  I have ordered some labwork in regard to your elevated hemoglobin. I have also referred you to hematology.   If you have any questions or concerns, please don't hesitate to send me a message via MyChart or call the office at [phone]. Thank you for visiting with [place] today! It's our pleasure caring for you.   Hypoglycemia Hypoglycemia occurs when the level of sugar (glucose) in the blood is too low. Hypoglycemia can happen in people who do or do not have diabetes. It can develop quickly, and it can be a medical emergency. For most people with diabetes, a blood glucose level below 70 mg/dL (3.9 mmol/L) is considered hypoglycemia. Glucose is a type of sugar that provides the body's main source of energy. Certain hormones (insulin and glucagon) control the level of glucose in the blood. Insulin lowers blood glucose, and glucagon raises blood glucose. Hypoglycemia can result from having too much insulin in the bloodstream, or from not eating enough food that contains glucose. You may also have reactive hypoglycemia, which happens within 4 hours after eating a meal. What are the causes? Hypoglycemia occurs most often in people who have diabetes and may be caused by:  Diabetes medicine.  Not eating enough, or not eating often enough.  Increased physical activity.  Drinking alcohol on an empty stomach. If you do not have diabetes, hypoglycemia may be caused by:  A tumor in the pancreas.  Not eating enough, or not eating for long periods at a time (fasting).  A severe infection or illness.  Certain medicines. What increases the risk? Hypoglycemia is more likely to develop in:  People who have diabetes and take medicines to lower blood glucose.  People who  abuse alcohol.  People who have a severe illness. What are the signs or symptoms? Mild symptoms Mild hypoglycemia may not cause any symptoms. If you do have symptoms, they may include:  Hunger.  Anxiety.  Sweating and feeling clammy.  Dizziness or feeling light-headed.  Sleepiness.  Nausea.  Increased heart rate.  Headache.  Blurry vision.  Irritability.  Tingling or numbness around the mouth, lips, or tongue.  A change in coordination.  Restless sleep. Moderate symptoms Moderate hypoglycemia can cause:  Mental confusion and poor judgment.  Behavior changes.  Weakness.  Irregular heartbeat. Severe symptoms Severe hypoglycemia is a medical emergency. It can cause:  Fainting.  Seizures.  Loss of consciousness (coma).  Death. How is this diagnosed? Hypoglycemia is diagnosed with a blood test to measure your blood glucose level. This blood test is done while you are having symptoms. Your health care provider may also do a physical exam and review your medical history. How is this treated? This condition can often be treated by immediately eating or drinking something that contains sugar, such as:  Fruit juice, 4-6 oz (120-150 mL).  Regular soda (not diet soda), 4-6 oz (120-150 mL).  Low-fat milk, 4 oz (120 mL).  Several pieces of hard candy.  Sugar or honey, 1 Tbsp (15 mL). Treating hypoglycemia if you have diabetes If you are alert and able to swallow safely, follow the 15:15 rule:  Take 15 grams of a rapid-acting carbohydrate. Talk with your health care provider about how much you should take.  Rapid-acting  options include: ? Glucose pills (take 15 grams). ? 6-8 pieces of hard candy. ? 4-6 oz (120-150 mL) of fruit juice. ? 4-6 oz (120-150 mL) of regular (not diet) soda. ? 1 Tbsp (15 mL) honey or sugar.  Check your blood glucose 15 minutes after you take the carbohydrate.  If the repeat blood glucose level is still at or below 70 mg/dL (3.9  mmol/L), take 15 grams of a carbohydrate again.  If your blood glucose level does not increase above 70 mg/dL (3.9 mmol/L) after 3 tries, seek emergency medical care.  After your blood glucose level returns to normal, eat a meal or a snack within 1 hour.  Treating severe hypoglycemia Severe hypoglycemia is when your blood glucose level is at or below 54 mg/dL (3 mmol/L). Severe hypoglycemia is a medical emergency. Get medical help right away. If you have severe hypoglycemia and you cannot eat or drink, you may need an injection of glucagon. A family member or close friend should learn how to check your blood glucose and how to give you a glucagon injection. Ask your health care provider if you need to have an emergency glucagon injection kit available. Severe hypoglycemia may need to be treated in a hospital. The treatment may include getting glucose through an IV. You may also need treatment for the cause of your hypoglycemia. Follow these instructions at home:  General instructions  Take over-the-counter and prescription medicines only as told by your health care provider.  Monitor your blood glucose as told by your health care provider.  Limit alcohol intake to no more than 1 drink a day for nonpregnant women and 2 drinks a day for men. One drink equals 12 oz of beer (355 mL), 5 oz of wine (148 mL), or 1 oz of hard liquor (44 mL).  Keep all follow-up visits as told by your health care provider. This is important. If you have diabetes:  Always have a rapid-acting carbohydrate snack with you to treat low blood glucose.  Follow your diabetes management plan as directed. Make sure you: ? Know the symptoms of hypoglycemia. It is important to treat it right away to prevent it from becoming severe. ? Take your medicines as directed. ? Follow your exercise plan. ? Follow your meal plan. Eat on time, and do not skip meals. ? Check your blood glucose as often as directed. Always check before  and after exercise. ? Follow your sick day plan whenever you cannot eat or drink normally. Make this plan in advance with your health care provider.  Share your diabetes management plan with people in your workplace, school, and household.  Check your urine for ketones when you are ill and as told by your health care provider.  Carry a medical alert card or wear medical alert jewelry. Contact a health care provider if:  You have problems keeping your blood glucose in your target range.  You have frequent episodes of hypoglycemia. Get help right away if:  You continue to have hypoglycemia symptoms after eating or drinking something containing glucose.  Your blood glucose is at or below 54 mg/dL (3 mmol/L).  You have a seizure.  You faint. These symptoms may represent a serious problem that is an emergency. Do not wait to see if the symptoms will go away. Get medical help right away. Call your local emergency services (911 in the [place] Summary  Hypoglycemia occurs when the level of sugar (glucose) in the blood is too low.  Hypoglycemia can  happen in people who do or do not have diabetes. It can develop quickly, and it can be a medical emergency.  Make sure you know the symptoms of hypoglycemia and how to treat it.  Always have a rapid-acting carbohydrate snack with you to treat low blood sugar. This information is not intended to replace advice given to you by your health care provider. Make sure you discuss any questions you have with your health care provider. Document Revised: 07/24/2017 Document Reviewed: 03/06/2015 Elsevier Patient Education  2020 [employer].

## 2019-05-23 ENCOUNTER — Telehealth: Payer: Self-pay | Admitting: Adult Health

## 2019-05-23 LAB — IRON,TIBC AND FERRITIN PANEL
%SAT: 37 % (calc) (ref 16–45)
Ferritin: 159 ng/mL (ref 16–288)
Iron: 124 ug/dL (ref 45–160)
TIBC: 337 mcg/dL (calc) (ref 250–450)

## 2019-05-23 LAB — ERYTHROPOIETIN: Erythropoietin: 9.8 m[IU]/mL (ref 2.6–18.5)

## 2019-05-23 NOTE — Telephone Encounter (Signed)
Recevied a new hem referral from Dr. Mardelle Matte for polycythemia. [name] returned my call and has been scheduled to see Lillard Anes on 4/21 at 930am w/labs at 9am. Pt aware to arrive 15 minutes early.

## 2019-05-24 ENCOUNTER — Encounter: Payer: Self-pay | Admitting: Family Medicine

## 2019-05-25 ENCOUNTER — Encounter: Payer: Self-pay | Admitting: Family Medicine

## 2019-05-27 ENCOUNTER — Other Ambulatory Visit: Payer: Self-pay

## 2019-05-27 MED ORDER — ROSUVASTATIN CALCIUM 20 MG PO TABS: 20.0000 mg | ORAL_TABLET | Freq: Every day | ORAL | 3 refills | Status: DC

## 2019-06-04 ENCOUNTER — Other Ambulatory Visit: Payer: Self-pay | Admitting: *Deleted

## 2019-06-04 DIAGNOSIS — D751 Secondary polycythemia: Secondary | ICD-10-CM

## 2019-06-05 ENCOUNTER — Inpatient Hospital Stay: Payer: Medicare PPO | Attending: Adult Health | Admitting: Adult Health

## 2019-06-05 ENCOUNTER — Inpatient Hospital Stay: Payer: Medicare PPO

## 2019-06-05 ENCOUNTER — Other Ambulatory Visit: Payer: Self-pay | Admitting: Oncology

## 2019-06-05 ENCOUNTER — Encounter: Payer: Self-pay | Admitting: Family Medicine

## 2019-06-05 ENCOUNTER — Encounter: Payer: Self-pay | Admitting: Adult Health

## 2019-06-05 ENCOUNTER — Other Ambulatory Visit: Payer: Self-pay

## 2019-06-05 VITALS — BP 148/85 | HR 65 | Temp 98.2°F | Resp 17 | Ht 62.0 in | Wt 152.5 lb

## 2019-06-05 DIAGNOSIS — E063 Autoimmune thyroiditis: Secondary | ICD-10-CM | POA: Diagnosis not present

## 2019-06-05 DIAGNOSIS — I10 Essential (primary) hypertension: Secondary | ICD-10-CM | POA: Insufficient documentation

## 2019-06-05 DIAGNOSIS — M129 Arthropathy, unspecified: Secondary | ICD-10-CM | POA: Insufficient documentation

## 2019-06-05 DIAGNOSIS — K219 Gastro-esophageal reflux disease without esophagitis: Secondary | ICD-10-CM | POA: Insufficient documentation

## 2019-06-05 DIAGNOSIS — D751 Secondary polycythemia: Secondary | ICD-10-CM | POA: Insufficient documentation

## 2019-06-05 DIAGNOSIS — E7800 Pure hypercholesterolemia, unspecified: Secondary | ICD-10-CM | POA: Insufficient documentation

## 2019-06-05 DIAGNOSIS — Z79899 Other long term (current) drug therapy: Secondary | ICD-10-CM | POA: Insufficient documentation

## 2019-06-05 DIAGNOSIS — Z7982 Long term (current) use of aspirin: Secondary | ICD-10-CM | POA: Diagnosis not present

## 2019-06-05 LAB — CBC WITH DIFFERENTIAL (CANCER CENTER ONLY)
Abs Immature Granulocytes: 0.01 10*3/uL (ref 0.00–0.07)
Basophils Absolute: 0.1 10*3/uL (ref 0.0–0.1)
Basophils Relative: 1 %
Eosinophils Absolute: 0.1 10*3/uL (ref 0.0–0.5)
Eosinophils Relative: 2 %
HCT: 46.6 % — ABNORMAL HIGH (ref 36.0–46.0)
Hemoglobin: 15.9 g/dL — ABNORMAL HIGH (ref 12.0–15.0)
Immature Granulocytes: 0 %
Lymphocytes Relative: 24 %
Lymphs Abs: 1.1 10*3/uL (ref 0.7–4.0)
MCH: 31.5 pg (ref 26.0–34.0)
MCHC: 34.1 g/dL (ref 30.0–36.0)
MCV: 92.5 fL (ref 80.0–100.0)
Monocytes Absolute: 0.3 10*3/uL (ref 0.1–1.0)
Monocytes Relative: 7 %
Neutro Abs: 3.1 10*3/uL (ref 1.7–7.7)
Neutrophils Relative %: 66 %
Platelet Count: 240 10*3/uL (ref 150–400)
RBC: 5.04 MIL/uL (ref 3.87–5.11)
RDW: 12.8 % (ref 11.5–15.5)
WBC Count: 4.7 10*3/uL (ref 4.0–10.5)
nRBC: 0 % (ref 0.0–0.2)

## 2019-06-05 LAB — CMP (CANCER CENTER ONLY)
ALT: 38 U/L (ref 0–44)
AST: 33 U/L (ref 15–41)
Albumin: 4.3 g/dL (ref 3.5–5.0)
Alkaline Phosphatase: 58 U/L (ref 38–126)
Anion gap: 13 (ref 5–15)
BUN: 14 mg/dL (ref 8–23)
CO2: 27 mmol/L (ref 22–32)
Calcium: 9.5 mg/dL (ref 8.9–10.3)
Chloride: 100 mmol/L (ref 98–111)
Creatinine: 0.88 mg/dL (ref 0.44–1.00)
GFR, Est AFR Am: >60 mL/min (ref >60)
GFR, Estimated: >60 mL/min (ref >60)
Glucose, Bld: 114 mg/dL — ABNORMAL HIGH (ref 70–99)
Potassium: 3.6 mmol/L (ref 3.5–5.1)
Sodium: 140 mmol/L (ref 135–145)
Total Bilirubin: 0.7 mg/dL (ref 0.3–1.2)
Total Protein: 7.4 g/dL (ref 6.5–8.1)

## 2019-06-05 LAB — RETICULOCYTES
Immature Retic Fract: 6.6 % (ref 2.3–15.9)
RBC.: 5.06 MIL/uL (ref 3.87–5.11)
Retic Count, Absolute: 57.2 10*3/uL (ref 19.0–186.0)
Retic Ct Pct: 1.1 % (ref 0.4–3.1)

## 2019-06-05 LAB — SAVE SMEAR(SSMR), FOR PROVIDER SLIDE REVIEW

## 2019-06-05 NOTE — Progress Notes (Addendum)
[place]  [phone]) [phone] [phone]) [phone]     ID: Tynesha Free DOB: [birth year]  MR#: [id number]  MLY#:[id number]  Patient Care Team: Leamon Arnt, MD as PCP - General (Family Medicine) Philemon Kingdom, MD as Consulting Physician (Internal Medicine) Belva Crome, MD as Consulting Physician (Cardiology) Chauncey Cruel, MD OTHER MD:  CHIEF COMPLAINT: elevated hemoglobin   CURRENT TREATMENT:  Undergoing evaluation   HISTORY OF CURRENT ILLNESS:  Cornell was referred to [place] after meeting with her PCP Dr. Jonni Sanger.  She had been noted to have an elevated hemoglobin that had been persistent over the past few years.  She drew an EPO level which was low normal, and referred her for hematology for further evaluation.    Aryahi's hemoglobin has been as follows:   Results for [place], KMYA PLACIDE (MRN [id number]) as of 06/05/2019 09:22  Ref. Range 01/27/2015 13:58 01/29/2015 09:00 01/29/2015 21:00 01/30/2015 04:24 12/10/2015 06:58 11/21/2016 08:16 09/08/2017 07:29 08/15/2018 11:15 02/21/2019 10:02 05/22/2019 08:57 06/05/2019 08:57  Hemoglobin Latest Ref Range: 12.0 - 15.0 g/dL 17.1 (H) 14.2 14.1 13.3 16.3 (H) 15.6 (H) 15.5 (H) 15.7 (H) 16.5 (H) 16.3 (H) 15.9 (H)   Shivaun has an extensive family history of CVA and heart disease.  She has no tobacco use, COPD, or sleep apnea.  Based on the above data she is here for evaluation and discussion.  The patient's subsequent history is as detailed below.  INTERVAL HISTORY: Jonee notes that in general she is feeling well.  She has no new issues such as lymphadenopathy, night sweats, headaches, vision issues, dizziness, abdominal fullness.  She has been worried that she might have leukemia, which is why she is here today to make sure that she does not.    REVIEW OF SYSTEMS: Ily is doing well today.  She has mild shortness of breath with exertion, however this is mainly noted when she is climbing stairs.  She is exercising by  walking 1 hour every day.  She says that she walks for this hour as quickly as she possibly can.    Breena denies any fever, chills, chest pain, palpitations, cough, shortness of breath, nasuea, vomiting, headaches, vision changes, bowel/bladder issues, skin issues or any other concerns.  A detailed ROS was otherwise non contributory.    PAST MEDICAL HISTORY: Past Medical History:  Diagnosis Date  . Arthritis   . Chest pain at rest 01/14/2013  . Complication of anesthesia   . Fracture of tibia and fibula 01/27/2015  . GERD (gastroesophageal reflux disease)   . Hashimoto's thyroiditis 11/17/2017   Lab Results Component Value Date  TSH 2.17 11/17/2017  FREET4 0.87 11/17/2017   Lab Results Component Value Date  TSH 2.17 11/17/2017  TSH 2.72 09/08/2017  TSH 5.20 (H) 11/21/2016  TSH 3.813 01/29/2015  FREET4 0.87 11/17/2017  FREET4 0.83 11/21/2016    . High cholesterol   . Hypertension   . PONV (postoperative nausea and vomiting), Scopalamine controls   . Seasonal allergies   . 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 Syndesmotic disruption of right ankle 03/06/2015  . Vitamin D deficiency 03/06/2015    PAST SURGICAL HISTORY: Past Surgical History:  Procedure Laterality Date  . BUNIONECTOMY Bilateral   . COLONOSCOPY    . HARDWARE REMOVAL Right 12/10/2015   Procedure: HARDWARE REMOVAL RIGHT ANKLE;  Surgeon: Altamese Dudley, MD;  Location: [facility];  Service: Orthopedics;  Laterality: Right;  . MYOMECTOMY    . ORIF TIBIA FRACTURE Right 01/29/2015   Procedure:  OPEN REDUCTION INTERNAL FIXATION (ORIF) RIGHT TIBIA  AND FIBULA FRACTURES;  Surgeon: Altamese Vilas, MD;  Location: [facility];  Service: Orthopedics;  Laterality: Right;    FAMILY HISTORY Family History  Problem Relation Age of Onset  . CVA Mother   . Heart disease Father   . Diabetes Father   . Heart attack Sister   . CVA Sister   . CVA Sister   . CVA Sister     GYNECOLOGIC HISTORY:  No LMP recorded. Patient is  postmenopausal. Menarche: 74 years old Age at first live birth: never [place] P 0 Contraceptive for 5 years previously  HRT for 1-2 years, stopped about 20 years ago  Hysterectomy? no Salpingo-oophorectomy?no    SOCIAL HISTORY: married, lives with her husband who is a retired Theatre manager in [place].  She is retired also, previously was Printmaker.  She has one son who also lives in [place].  He does "odd jobs" as his main source of income.  She has two grandchildren.  She is a never smoker, drinks 1-2 glasses of wine per week, and denies drug use.       ADVANCED DIRECTIVES: not in place   HEALTH MAINTENANCE: Social History   Tobacco Use  . Smoking status: Never Smoker  . Smokeless tobacco: Never Used  Substance Use Topics  . Alcohol use: Yes    Alcohol/week: 3.0 - 4.0 standard drinks    Types: 3 - 4 Glasses of wine per week  . Drug use: No     Colonoscopy: Cologuard completed in 2018  PAP: no longer needed  Bone density: Normal in 2017--due again in 2022  Mammogram: 02/2019 normal   No Known Allergies  Current Outpatient Medications  Medication Sig Dispense Refill  . hydrochlorothiazide (HYDRODIURIL) 25 MG tablet Take 25 mg by mouth daily.    Marland Kitchen aspirin 81 MG tablet Take 81 mg by mouth daily.    . cholecalciferol (VITAMIN D3) 25 MCG (1000 UT) tablet Take by mouth daily.     . Coenzyme Q10 (COQ10) 100 MG CAPS Take 100 mg by mouth daily.    . fluticasone (FLONASE) 50 MCG/ACT nasal spray Place 2 sprays into both nostrils as needed for allergies or rhinitis.    Marland Kitchen Ketotifen Fumarate (ALLERGY EYE DROPS OP) Apply 1 drop to eye daily as needed (allergies).    . Multiple Vitamin (MULTIVITAMIN) tablet Take 1 tablet by mouth daily.    . Omega-3 Fatty Acids (FISH OIL) 1000 MG CAPS Take by mouth daily.    . rosuvastatin (CRESTOR) 20 MG tablet Take 1 tablet (20 mg total) by mouth daily. 90 tablet 3  . Selenium 200 MCG CAPS Take 1 capsule by mouth daily.    . TURMERIC PO  Take 1,000 mg by mouth 2 (two) times daily.    . vitamin C (VITAMIN C) 500 MG tablet Take 1 tablet (500 mg total) by mouth daily. 60 tablet 2   No current facility-administered medications for this visit.    OBJECTIVE:  Vitals:   06/05/19 0932  BP: (!) 148/85  Pulse: 65  Resp: 17  Temp: 98.2 F (36.8 C)  SpO2: 99%     Body mass index is 27.89 kg/m.   Wt Readings from Last 3 Encounters:  06/05/19 152 lb 8 oz (69.2 kg)  05/22/19 149 lb 9.6 oz (67.9 kg)  02/21/19 150 lb 3.2 oz (68.1 kg)  ECOG FS:1 - Symptomatic but completely ambulatory GENERAL: Patient is a well appearing female in no  acute distress HEENT:  Sclerae anicteric.  Mask in place.   Neck is supple.  NODES:  No cervical, supraclavicular, or axillary lymphadenopathy palpated.  LUNGS:  Clear to auscultation bilaterally.  No wheezes or rhonchi. HEART:  Regular rate and rhythm. No murmur appreciated. ABDOMEN:  Soft, nontender.  Positive, normoactive bowel sounds. No organomegaly palpated. MSK:  No focal spinal tenderness to palpation. EXTREMITIES:  No peripheral edema.   SKIN:  Clear with no obvious rashes or skin changes. No nail dyscrasia. NEURO:  Nonfocal. Well oriented.  Appropriate affect.    LAB RESULTS:  CMP     Component Value Date/Time   NA 140 06/05/2019 0857   NA 140 01/11/2016 0000   K 3.6 06/05/2019 0857   CL 100 06/05/2019 0857   CO2 27 06/05/2019 0857   GLUCOSE 114 (H) 06/05/2019 0857   BUN 14 06/05/2019 0857   BUN 12 01/11/2016 0000   CREATININE 0.88 06/05/2019 0857   CALCIUM 9.5 06/05/2019 0857   CALCIUM 8.9 01/29/2015 0900   PROT 7.4 06/05/2019 0857   ALBUMIN 4.3 06/05/2019 0857   AST 33 06/05/2019 0857   ALT 38 06/05/2019 0857   ALKPHOS 58 06/05/2019 0857   BILITOT 0.7 06/05/2019 0857   GFRNONAA >60 06/05/2019 0857   GFRAA >60 06/05/2019 0857    No results found for: Ronnald Ramp, A1GS, A2GS, BETS, BETA2SER, GAMS, MSPIKE, SPEI  No results found for: Nils Pyle, [facility]  Lab Results  Component Value Date   WBC 4.7 06/05/2019   NEUTROABS 3.1 06/05/2019   HGB 15.9 (H) 06/05/2019   HCT 46.6 (H) 06/05/2019   MCV 92.5 06/05/2019   PLT 240 06/05/2019      Chemistry      Component Value Date/Time   NA 140 06/05/2019 0857   NA 140 01/11/2016 0000   K 3.6 06/05/2019 0857   CL 100 06/05/2019 0857   CO2 27 06/05/2019 0857   BUN 14 06/05/2019 0857   BUN 12 01/11/2016 0000   CREATININE 0.88 06/05/2019 0857   GLU 98 01/11/2016 0000      Component Value Date/Time   CALCIUM 9.5 06/05/2019 0857   CALCIUM 8.9 01/29/2015 0900   ALKPHOS 58 06/05/2019 0857   AST 33 06/05/2019 0857   ALT 38 06/05/2019 0857   BILITOT 0.7 06/05/2019 0857       No results found for: LABCA2  No components found for: [id number]  No results for input(s): INR in the last 168 hours.  No results found for: LABCA2  No results found for: JIR678  No results found for: LFY101  No results found for: BPZ025  No results found for: CA2729  No components found for: HGQUANT  No results found for: CEA1 / No results found for: CEA1   No results found for: AFPTUMOR  No results found for: CHROMOGRNA  No results found for: PSA1  Appointment on 06/05/2019  Component Date Value Ref Range Status  . Retic Ct Pct 06/05/2019 1.1  0.4 - 3.1 % Final  . RBC. 06/05/2019 5.06  3.87 - 5.11 MIL/uL Final  . Retic Count, Absolute 06/05/2019 57.2  19.0 - 186.0 K/uL Final  . Immature Retic Fract 06/05/2019 6.6  2.3 - 15.9 % Final   Performed at [facility], [place] [address] [place], [place] [address]  . Smear Review 06/05/2019 SMEAR STAINED AND AVAILABLE FOR REVIEW   Final   Performed at [facility], [address] [place], [place]  [address]  . Sodium 06/05/2019 140  135 - 145 mmol/L Final  . Potassium 06/05/2019 3.6  3.5 - 5.1 mmol/L Final  . Chloride 06/05/2019 100  98 - 111 mmol/L Final  . CO2 06/05/2019 27  22  - 32 mmol/L Final  . Glucose, Bld 06/05/2019 114* 70 - 99 mg/dL Final   Glucose reference range applies only to samples taken after fasting for at least 8 hours.  . BUN 06/05/2019 14  8 - 23 mg/dL Final  . Creatinine 06/05/2019 0.88  0.44 - 1.00 mg/dL Final  . Calcium 06/05/2019 9.5  8.9 - 10.3 mg/dL Final  . Total Protein 06/05/2019 7.4  6.5 - 8.1 g/dL Final  . Albumin 06/05/2019 4.3  3.5 - 5.0 g/dL Final  . AST 06/05/2019 33  15 - 41 U/L Final  . ALT 06/05/2019 38  0 - 44 U/L Final  . Alkaline Phosphatase 06/05/2019 58  38 - 126 U/L Final  . Total Bilirubin 06/05/2019 0.7  0.3 - 1.2 mg/dL Final  . GFR, Est Non Af Am 06/05/2019 >60  >60 mL/min Final  . GFR, Est AFR Am 06/05/2019 >60  >60 mL/min Final  . Anion gap 06/05/2019 13  5 - 15 Final   Performed at [facility], [place] [address], [place], [place] [address]  . WBC Count 06/05/2019 4.7  4.0 - 10.5 K/uL Final  . RBC 06/05/2019 5.04  3.87 - 5.11 MIL/uL Final  . Hemoglobin 06/05/2019 15.9* 12.0 - 15.0 g/dL Final  . HCT 06/05/2019 46.6* 36.0 - 46.0 % Final  . MCV 06/05/2019 92.5  80.0 - 100.0 fL Final  . MCH 06/05/2019 31.5  26.0 - 34.0 pg Final  . MCHC 06/05/2019 34.1  30.0 - 36.0 g/dL Final  . RDW 06/05/2019 12.8  11.5 - 15.5 % Final  . Platelet Count 06/05/2019 240  150 - 400 K/uL Final  . nRBC 06/05/2019 0.0  0.0 - 0.2 % Final  . Neutrophils Relative % 06/05/2019 66  % Final  . Neutro Abs 06/05/2019 3.1  1.7 - 7.7 K/uL Final  . Lymphocytes Relative 06/05/2019 24  % Final  . Lymphs Abs 06/05/2019 1.1  0.7 - 4.0 K/uL Final  . Monocytes Relative 06/05/2019 7  % Final  . Monocytes Absolute 06/05/2019 0.3  0.1 - 1.0 K/uL Final  . Eosinophils Relative 06/05/2019 2  % Final  . Eosinophils Absolute 06/05/2019 0.1  0.0 - 0.5 K/uL Final  . Basophils Relative 06/05/2019 1  % Final  . Basophils Absolute 06/05/2019 0.1  0.0 - 0.1 K/uL Final  . Immature Granulocytes 06/05/2019 0  % Final  . Abs Immature  Granulocytes 06/05/2019 0.01  0.00 - 0.07 K/uL Final   Performed at [facility], [place] [address], [place], [place] [address]    (this displays the last labs from the last 3 days)  No results found for: TOTALPROTELP, ALBUMINELP, A1GS, A2GS, BETS, BETA2SER, GAMS, MSPIKE, SPEI (this displays SPEP labs)  No results found for: KPAFRELGTCHN, LAMBDASER, KAPLAMBRATIO (kappa/lambda light chains)  No results found for: HGBA, HGBA2QUANT, HGBFQUANT, HGBSQUAN (Hemoglobinopathy evaluation)   No results found for: LDH  Lab Results  Component Value Date   IRON 124 05/22/2019   TIBC 337 05/22/2019   IRONPCTSAT 37 05/22/2019   (Iron and TIBC)  Lab Results  Component Value Date   FERRITIN 159 05/22/2019    Urinalysis    Component Value Date/Time   COLORURINE YELLOW 05/22/2019 0857  APPEARANCEUR CLEAR 05/22/2019 0857   LABSPEC >=1.030 (A) 05/22/2019 0857   PHURINE 5.5 05/22/2019 0857   GLUCOSEU NEGATIVE 05/22/2019 0857   HGBUR TRACE-LYSED (A) 05/22/2019 0857   BILIRUBINUR NEGATIVE 05/22/2019 0857   KETONESUR NEGATIVE 05/22/2019 0857   UROBILINOGEN 0.2 05/22/2019 0857   NITRITE NEGATIVE 05/22/2019 0857   LEUKOCYTESUR NEGATIVE 05/22/2019 0857     STUDIES: No results found.  ELIGIBLE FOR AVAILABLE RESEARCH PROTOCOL:   ASSESSMENT: 74 y.o. [place] woman here for evaluation of polycythemia.    1. 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 Jana Hakim today to discuss the plan after we reviewed her blood film under the microscope.    We reviewed with Shannelle that all blood cells are made in the marrow of bones and include three main types of blood cells.  1. White Blood Cells: These are immune cells.  Tailynn's are normal in number and appearance.    2. Platelets:  These are  clotting cells.  Ashlyne's are normal in number and in appearance.    3.  Red blood cells: These are oxygen carrying cells.  These are increased in number, normal in appearance.    We reviewed that too many red blood cells is called polycythemia.  The most common reason is due to a lack of oxygen.  This is secondary polycythemia.  Other causes of secondary polycythemia include rare tumors, such as kidney cancer, which is very uncommon.  The other reason for polycythemia  Is the association with an acquired genetic mutation on blood cells called JAK-2.  Having this acquired mutation causes primary polycythemia, also known as polycthemia vera.    We discussed that red cell production is regulated by an enzyme called erythropoeitin (EPO).  EPO is made by the kidneys.  If the kidneys do not sense enough oxygen, the EPO will increase to increase the production of the red blood cells.  In Polycythemia vera the EPO is commonly low, the spleen is frequently enlarged, other cells can also be increased, and the hemoglobin is consistently above 16.    Clinically, Adamarie's situation isn't strongly suggestive of polycythemia vera.  However, due to her recen history of increased hemoglobins over 16, and strong family history of CVA, we will check a JAK2 to confirm.  We explained that Polycythemia vera is associated with clotting, CVA, and leukemic transformation.    If JAK2 is negative, then likely, Herminia has a normal hemoglobin, but on the upper end of normal.  We reviewed on how "normal" lab ranges are determined and that 2.5% of the populations will be on the upper and lower ends of this range.    So long as Twilia's JAK2 is negative, she will not need a f/u appointment with [place].  I will call her once we receive those results.  She was recommended to continue with the appropriate pandemic precautions.   Total encounter time: 70 minutes*  Wilber Bihari, NP 06/05/19 5:52 PM Medical Oncology and Hematology [facility] [place], [place] [address] Tel. [phone]    Fax. [phone]   ADDENDUM: Met today with this 74 year old [place] woman who has history of elevated hemoglobin.  Note that her hemoglobin has not consistently above 16 and in fact was 15.9 here today.  There is no obesity, evidence of heart  failure, or history of asthma, emphysema, or tobacco abuse.  REVIEW OF THE PERIPHERAL BLOOD FILM today shows no significant anisocytosis, no tail poikilocytes, nucleated red blood cells, and only minimal rouleaux.  There is no immaturity in the white cell series.  There are no artifactual platelet clumps  In addition, there is no leukocytosis or thrombocytosis, no palpable splenomegaly, and no history of thrombosis.  The erythropoietin level is in the normal range although in the low normal range.  We are going to send a JAK2 test, which I expect to be negative.  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  Erythrocytosis is not associated with increased clotting risk or dryness formation to acute leukemia.  Approximately 2% of normal Americans can be expected to have hemoglobins in the 15.0-16.0 range. This can of course change to a small extent depending on hydration  If JAK2 is negative I am comfortable releasing her back to her primary care physician with no specific recommendations other than maintaining good hydration  I personally saw this patient and performed a substantive portion of this encounter with the listed APP documented above.   Chauncey Cruel, MD Medical Oncology and Hematology [facility] [address] [place], [place] [address] Tel. [phone]    Fax. [phone]:    *Total Encounter Time as defined by the Centers for Medicare and Medicaid Services includes, in addition to the face-to-face time of a patient visit (documented in the note above) non-face-to-face time:  obtaining and reviewing outside history, ordering and reviewing medications, tests or procedures, care coordination (communications with other health care professionals or caregivers) and documentation in the medical record.

## 2019-06-06 ENCOUNTER — Telehealth: Payer: Self-pay | Admitting: Adult Health

## 2019-06-06 ENCOUNTER — Encounter: Payer: Self-pay | Admitting: Family Medicine

## 2019-06-06 MED ORDER — METOPROLOL SUCCINATE ER 25 MG PO TB24: 12.5000 mg | ORAL_TABLET | Freq: Every day | ORAL | 5 refills | Status: DC

## 2019-06-06 NOTE — Telephone Encounter (Signed)
No 4/21 los. No changes made to pt's schedule.  

## 2019-06-11 ENCOUNTER — Telehealth: Payer: Self-pay | Admitting: Adult Health

## 2019-06-11 NOTE — Telephone Encounter (Signed)
Called patient to review negative Jak2 results which are negative.  She does not need to f/u with [place] at this point, and we can see her at any point in the future when the need arises.   I LMOM for her to call back.  Lillard Anes, NP

## 2019-06-12 ENCOUNTER — Telehealth: Payer: Self-pay | Admitting: Adult Health

## 2019-06-12 NOTE — Telephone Encounter (Signed)
Spoke with [name] about her negative Jak2.  She does not have polycythemia.  She likely has upper limit of normal hemoglobin.  She will f/u with [place] on an as needed basis.  We are always happy to see her back should the need arise.    [name]

## 2019-06-12 NOTE — Telephone Encounter (Signed)
-----   Message from Morrell Riddle, RN sent at 06/12/2019 11:38 AM EDT ----- Regarding: Call Hi!  She called and left a message. She is returning your call.  Thanks

## 2019-07-25 LAB — JAK2 (INCLUDING V617F AND EXON 12), MPL,& CALR W/RFL MPN PANEL (NGS)

## 2019-08-22 ENCOUNTER — Encounter: Payer: Self-pay | Admitting: Family Medicine

## 2019-08-22 MED ORDER — HYDROCHLOROTHIAZIDE 25 MG PO TABS: 25.0000 mg | ORAL_TABLET | Freq: Every day | ORAL | 1 refills | Status: DC

## 2019-08-22 MED ORDER — METOPROLOL SUCCINATE ER 25 MG PO TB24: 12.5000 mg | ORAL_TABLET | Freq: Every day | ORAL | 1 refills | Status: DC

## 2019-08-22 NOTE — Telephone Encounter (Signed)
Spoke to pt clarified blood pressure medications, HCTZ and Metoprolol sent to [facility] pharmacy. Pt verbalized understanding.

## 2019-11-18 ENCOUNTER — Ambulatory Visit: Payer: Medicare PPO | Admitting: Family Medicine

## 2019-11-27 ENCOUNTER — Encounter: Payer: Self-pay | Admitting: Family Medicine

## 2019-11-27 ENCOUNTER — Other Ambulatory Visit: Payer: Self-pay

## 2019-11-27 ENCOUNTER — Ambulatory Visit: Payer: Medicare PPO | Admitting: Family Medicine

## 2019-11-27 VITALS — BP 160/90 | HR 62 | Temp 98.1°F | Ht 62.0 in | Wt 145.2 lb

## 2019-11-27 DIAGNOSIS — F430 Acute stress reaction: Secondary | ICD-10-CM

## 2019-11-27 DIAGNOSIS — I10 Essential (primary) hypertension: Secondary | ICD-10-CM | POA: Diagnosis not present

## 2019-11-27 DIAGNOSIS — Z23 Encounter for immunization: Secondary | ICD-10-CM | POA: Diagnosis not present

## 2019-11-27 DIAGNOSIS — I471 Supraventricular tachycardia: Secondary | ICD-10-CM

## 2019-11-27 DIAGNOSIS — D751 Secondary polycythemia: Secondary | ICD-10-CM | POA: Diagnosis not present

## 2019-11-27 NOTE — Progress Notes (Signed)
Subjective  CC:  Chief Complaint  Patient presents with  . Hypertension    random BP checks at home, around 120/70's   . Health Maintenance    flu shot given in office today     HPI: [name] is a 74 y.o. female who presents to the office today to address the problems listed above in the chief complaint.  Hypertension f/u: Control is good .  Home readings are normal.  Patient is tolerating 12.5 mg Toprol-XL daily with rare palpitations and no longer having symptoms of hyperglycemia or fatigue.  Pt reports she is doing well. taking medications as instructed, no medication side effects noted, no TIAs, no chest pain on exertion, no dyspnea on exertion, no swelling of ankles.  Today's readings elevated due to stress.  Denies adverse effects from his BP medications. Compliance with medication is good.   Stress reaction: Patient not sleeping well.  Her son is not doing well.  Bipolar flare.  Separation from his wife.  Has been hospitalized overnight.  She reports he tends to be very agitated and aggressive.  She denies panic symptoms or symptoms of depression.  Polycythemia: Reviewed hematology consult notes and lab work.  Fortunately, no secondary causes of polycythemia were identified.  JAK2 testing was negative.  Diagnosis is high normal hemoglobin and no further work-up is needed at this time.  Flu shot today  Assessment  1. Essential hypertension   2. Polycythemia   3. Ectopic atrial tachycardia (HCC)   4. Stress reaction      Plan    Hypertension f/u: BP control is well controlled.  Continue current medications.  Continue home monitoring.  Elevation today due to stress.  Blood pressures become elevated again we can increase beta-blocker to 25 mg daily.  She seems to be tolerating this now.  Palpitations f/u: Topic atrial tachycardia improved symptomatically with beta-blocker.  Polycythemia: Normal variant.  No further work-up needed.  Stress reaction: Extensive  counseling done.  Patient to seek help from therapist.  Discussed self-care.  Follow-up if needed Education regarding management of these chronic disease states was given. Management strategies discussed on successive visits include dietary and exercise recommendations, goals of achieving and maintaining IBW, and lifestyle modifications aiming for adequate sleep and minimizing stressors.   Follow up: CPE in January  No orders of the defined types were placed in this encounter.  No orders of the defined types were placed in this encounter.     BP Readings from Last 3 Encounters:  11/27/19 (!) 160/90  06/05/19 (!) 148/85  05/22/19 128/78   Wt Readings from Last 3 Encounters:  11/27/19 145 lb 3.2 oz (65.9 kg)  06/05/19 152 lb 8 oz (69.2 kg)  05/22/19 149 lb 9.6 oz (67.9 kg)    Lab Results  Component Value Date   CHOL 203 (H) 05/22/2019   CHOL 220 (H) 02/21/2019   CHOL 204 (H) 08/15/2018   Lab Results  Component Value Date   HDL 93.50 05/22/2019   HDL 97.70 02/21/2019   HDL 92.00 08/15/2018   Lab Results  Component Value Date   LDLCALC 97 05/22/2019   LDLCALC 111 (H) 02/21/2019   LDLCALC 102 (H) 08/15/2018   Lab Results  Component Value Date   TRIG 63.0 05/22/2019   TRIG 54.0 02/21/2019   TRIG 53.0 08/15/2018   Lab Results  Component Value Date   CHOLHDL 2 05/22/2019   CHOLHDL 2 02/21/2019   CHOLHDL 2 08/15/2018   No results  found for: LDLDIRECT Lab Results  Component Value Date   CREATININE 0.88 06/05/2019   BUN 14 06/05/2019   NA 140 06/05/2019   K 3.6 06/05/2019   CL 100 06/05/2019   CO2 27 06/05/2019    The 10-year ASCVD risk score Denman George DC Jr., et al., 2013) is: 22.1%   Values used to calculate the score:     Age: 41 years     Sex: Female     Is Non-Hispanic African American: No     Diabetic: No     Tobacco smoker: No     Systolic Blood Pressure: 160 mmHg     Is BP treated: Yes     HDL Cholesterol: 93.5 mg/dL     Total Cholesterol: 203  mg/dL  I reviewed the patients updated PMH, FH, and SocHx.    Patient Active Problem List   Diagnosis Date Noted  . Polycythemia 02/25/2019  . IFG (impaired fasting glucose) 02/22/2019  . Ectopic atrial tachycardia (HCC) 02/21/2019  . Hashimoto's thyroiditis 11/17/2017  . Seasonal allergies   . GERD (gastroesophageal reflux disease)   . Vitamin D deficiency 03/06/2015  . Mixed hyperlipidemia   . Essential hypertension 01/14/2013    Allergies: Patient has no known allergies.  Social History: Patient  reports that she has never smoked. She has never used smokeless tobacco. She reports current alcohol use of about 3.0 - 4.0 standard drinks of alcohol per week. She reports that she does not use drugs.  Current Meds  Medication Sig  . aspirin 81 MG tablet Take 81 mg by mouth daily.  . cholecalciferol (VITAMIN D3) 25 MCG (1000 UT) tablet Take by mouth daily.   . Coenzyme Q10 (COQ10) 100 MG CAPS Take 100 mg by mouth daily.  . fluticasone (FLONASE) 50 MCG/ACT nasal spray Place 2 sprays into both nostrils as needed for allergies or rhinitis.  . hydrochlorothiazide (HYDRODIURIL) 25 MG tablet Take 1 tablet (25 mg total) by mouth daily.  Marland Kitchen Ketotifen Fumarate (ALLERGY EYE DROPS OP) Apply 1 drop to eye daily as needed (allergies).  . metoprolol succinate (TOPROL-XL) 25 MG 24 hr tablet Take 0.5-1 tablets (12.5-25 mg total) by mouth daily. (Patient taking differently: Take 12.5 mg by mouth daily. )  . Multiple Vitamin (MULTIVITAMIN) tablet Take 1 tablet by mouth daily.  . Omega-3 Fatty Acids (FISH OIL) 1000 MG CAPS Take by mouth daily.  . rosuvastatin (CRESTOR) 20 MG tablet Take 1 tablet (20 mg total) by mouth daily.  . Selenium 200 MCG CAPS Take 1 capsule by mouth daily.  . TURMERIC PO Take 1,000 mg by mouth 2 (two) times daily.  . vitamin C (VITAMIN C) 500 MG tablet Take 1 tablet (500 mg total) by mouth daily.    Review of Systems: Cardiovascular: negative for chest pain, palpitations, leg  swelling, orthopnea Respiratory: negative for SOB, wheezing or persistent cough Gastrointestinal: negative for abdominal pain Genitourinary: negative for dysuria or gross hematuria  Objective  Vitals: BP (!) 160/90   Pulse 62   Temp 98.1 F (36.7 C) (Temporal)   Ht 5\' 2"  (1.575 m)   Wt 145 lb 3.2 oz (65.9 kg)   SpO2 98%   BMI 26.56 kg/m  General: no acute distress  Psych:  Alert and oriented, sad mood and affect, tearful at times HEENT:  Normocephalic, atraumatic, supple neck  Cardiovascular:  RRR without murmur. no edema Respiratory:  Good breath sounds bilaterally, CTAB with normal respiratory effort   Commons side effects, risks, benefits, and  alternatives for medications and treatment plan prescribed today were discussed, and the patient expressed understanding of the given instructions. Patient is instructed to call or message via MyChart if he/she has any questions or concerns regarding our treatment plan. No barriers to understanding were identified. We discussed Red Flag symptoms and signs in detail. Patient expressed understanding regarding what to do in case of urgent or emergency type symptoms.   Medication list was reconciled, printed and provided to the patient in AVS. Patient instructions and summary information was reviewed with the patient as documented in the AVS. This note was prepared with assistance of Dragon voice recognition software. Occasional wrong-word or sound-a-like substitutions may have occurred due to the inherent limitations of voice recognition software  This visit occurred during the SARS-CoV-2 public health emergency.  Safety protocols were in place, including screening questions prior to the visit, additional usage of staff PPE, and extensive cleaning of exam room while observing appropriate contact time as indicated for disinfecting solutions.

## 2019-11-27 NOTE — Patient Instructions (Signed)
Please return in January for your annual complete physical; please come fasting.  Keep taking your medications as you are and checking your blood pressure at home.  Take care of yourself and I hope your son improves.   Today you were given your flu vaccination.   If you have any questions or concerns, please don't hesitate to send me a message via MyChart or call the office at [phone]. Thank you for visiting with [place] today! It's our pleasure caring for you.

## 2019-11-27 NOTE — Addendum Note (Signed)
Addended by: Laddie Aquas A on: 11/27/2019 09:12 AM   Modules accepted: Orders

## 2019-11-27 NOTE — Addendum Note (Signed)
Addended by: Laddie Aquas A on: 11/27/2019 09:11 AM   Modules accepted: Orders

## 2020-01-13 ENCOUNTER — Ambulatory Visit (INDEPENDENT_AMBULATORY_CARE_PROVIDER_SITE_OTHER): Payer: Medicare PPO | Admitting: Psychology

## 2020-01-13 DIAGNOSIS — F4323 Adjustment disorder with mixed anxiety and depressed mood: Secondary | ICD-10-CM | POA: Diagnosis not present

## 2020-01-20 ENCOUNTER — Ambulatory Visit: Payer: Medicare PPO | Admitting: Psychology

## 2020-02-03 ENCOUNTER — Ambulatory Visit: Payer: Medicare PPO | Admitting: Psychology

## 2020-02-05 ENCOUNTER — Other Ambulatory Visit: Payer: Self-pay | Admitting: Family Medicine

## 2020-02-09 ENCOUNTER — Ambulatory Visit (HOSPITAL_COMMUNITY): Payer: Self-pay

## 2020-02-09 ENCOUNTER — Other Ambulatory Visit: Payer: Self-pay

## 2020-02-09 ENCOUNTER — Emergency Department: Admit: 2020-02-09 | Discharge status: Absent without leave | Payer: Self-pay

## 2020-02-09 ENCOUNTER — Encounter: Payer: Self-pay | Admitting: Family Medicine

## 2020-02-09 ENCOUNTER — Emergency Department
Admission: EM | Admit: 2020-02-09 | Discharge: 2020-02-09 | Disposition: A | Discharge status: Home / Self Care | Payer: Medicare PPO | Source: Home / Self Care

## 2020-02-09 DIAGNOSIS — L03113 Cellulitis of right upper limb: Secondary | ICD-10-CM | POA: Diagnosis not present

## 2020-02-09 DIAGNOSIS — T22111A Burn of first degree of right forearm, initial encounter: Secondary | ICD-10-CM

## 2020-02-09 MED ORDER — AMOXICILLIN-POT CLAVULANATE 875-125 MG PO TABS
1.0000 | ORAL_TABLET | Freq: Two times a day (BID) | ORAL | 0 refills | Status: DC
Start: 2020-02-09 — End: 2020-02-24
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
## 2020-02-16 DIAGNOSIS — Z20822 Contact with and (suspected) exposure to covid-19: Secondary | ICD-10-CM | POA: Diagnosis not present

## 2020-02-17 ENCOUNTER — Ambulatory Visit: Payer: Medicare PPO | Admitting: Psychology

## 2020-02-24 ENCOUNTER — Other Ambulatory Visit: Payer: Self-pay

## 2020-02-24 ENCOUNTER — Other Ambulatory Visit: Payer: Self-pay | Admitting: Family Medicine

## 2020-02-24 ENCOUNTER — Ambulatory Visit (INDEPENDENT_AMBULATORY_CARE_PROVIDER_SITE_OTHER): Payer: Medicare PPO | Admitting: Family Medicine

## 2020-02-24 ENCOUNTER — Encounter: Payer: Self-pay | Admitting: Family Medicine

## 2020-02-24 VITALS — BP 130/82 | HR 58 | Temp 98.2°F | Ht 62.0 in | Wt 145.6 lb

## 2020-02-24 DIAGNOSIS — E782 Mixed hyperlipidemia: Secondary | ICD-10-CM

## 2020-02-24 DIAGNOSIS — I10 Essential (primary) hypertension: Secondary | ICD-10-CM | POA: Diagnosis not present

## 2020-02-24 DIAGNOSIS — Z780 Asymptomatic menopausal state: Secondary | ICD-10-CM | POA: Diagnosis not present

## 2020-02-24 DIAGNOSIS — E2839 Other primary ovarian failure: Secondary | ICD-10-CM

## 2020-02-24 DIAGNOSIS — R7301 Impaired fasting glucose: Secondary | ICD-10-CM

## 2020-02-24 DIAGNOSIS — Z1231 Encounter for screening mammogram for malignant neoplasm of breast: Secondary | ICD-10-CM

## 2020-02-24 DIAGNOSIS — I471 Supraventricular tachycardia: Secondary | ICD-10-CM | POA: Diagnosis not present

## 2020-02-24 DIAGNOSIS — Z0000 Encounter for general adult medical examination without abnormal findings: Secondary | ICD-10-CM

## 2020-02-24 LAB — CBC WITH DIFFERENTIAL/PLATELET
Basophils Absolute: 0 10*3/uL (ref 0.0–0.1)
Basophils Relative: 0.7 % (ref 0.0–3.0)
Eosinophils Absolute: 0.1 10*3/uL (ref 0.0–0.7)
Eosinophils Relative: 1.6 % (ref 0.0–5.0)
HCT: 45.8 % (ref 36.0–46.0)
Hemoglobin: 15.6 g/dL — ABNORMAL HIGH (ref 12.0–15.0)
Lymphocytes Relative: 25.2 % (ref 12.0–46.0)
Lymphs Abs: 1.2 10*3/uL (ref 0.7–4.0)
MCHC: 34.1 g/dL (ref 30.0–36.0)
MCV: 92.2 fl (ref 78.0–100.0)
Monocytes Absolute: 0.3 10*3/uL (ref 0.1–1.0)
Monocytes Relative: 6.3 % (ref 3.0–12.0)
Neutro Abs: 3.2 10*3/uL (ref 1.4–7.7)
Neutrophils Relative %: 66.2 % (ref 43.0–77.0)
Platelets: 244 10*3/uL (ref 150.0–400.0)
RBC: 4.97 Mil/uL (ref 3.87–5.11)
RDW: 13.1 % (ref 11.5–15.5)
WBC: 4.8 10*3/uL (ref 4.0–10.5)

## 2020-02-24 LAB — COMPREHENSIVE METABOLIC PANEL
ALT: 30 U/L (ref 0–35)
AST: 26 U/L (ref 0–37)
Albumin: 4.6 g/dL (ref 3.5–5.2)
Alkaline Phosphatase: 52 U/L (ref 39–117)
BUN: 11 mg/dL (ref 6–23)
CO2: 32 mEq/L (ref 19–32)
Calcium: 9.7 mg/dL (ref 8.4–10.5)
Chloride: 101 mEq/L (ref 96–112)
Creatinine, Ser: 0.79 mg/dL (ref 0.40–1.20)
GFR: 74.59 mL/min (ref >60.00)
Glucose, Bld: 109 mg/dL — ABNORMAL HIGH (ref 70–99)
Potassium: 3.6 mEq/L (ref 3.5–5.1)
Sodium: 138 mEq/L (ref 135–145)
Total Bilirubin: 0.6 mg/dL (ref 0.2–1.2)
Total Protein: 7.2 g/dL (ref 6.0–8.3)

## 2020-02-24 LAB — LIPID PANEL
Cholesterol: 185 mg/dL (ref 0–200)
HDL: 88 mg/dL (ref >39.00)
LDL Cholesterol: 84 mg/dL (ref 0–99)
NonHDL: 97.31
Total CHOL/HDL Ratio: 2
Triglycerides: 65 mg/dL (ref 0.0–149.0)
VLDL: 13 mg/dL (ref 0.0–40.0)

## 2020-02-24 LAB — HEMOGLOBIN A1C: Hgb A1c MFr Bld: 5.9 % (ref 4.6–6.5)

## 2020-02-24 LAB — TSH: TSH: 2.67 u[IU]/mL (ref 0.35–4.50)

## 2020-02-24 MED ORDER — METOPROLOL SUCCINATE ER 25 MG PO TB24: 12.5000 mg | ORAL_TABLET | Freq: Every day | ORAL | 3 refills | Status: DC

## 2020-02-24 MED ORDER — METOPROLOL SUCCINATE ER 25 MG PO TB24: 25.0000 mg | ORAL_TABLET | Freq: Every day | ORAL | 3 refills | Status: DC

## 2020-02-24 NOTE — Patient Instructions (Signed)
Please return in 6 months for hypertension follow up.  I will release your lab results to you on your MyChart account with further instructions. Please reply with any questions.   If you have any questions or concerns, please don't hesitate to send me a message via MyChart or call the office at [phone]. Thank you for visiting with [place] today! It's our pleasure caring for you.   Preventive Care 12 Years and Older, Female Preventive care refers to lifestyle choices and visits with your health care provider that can promote health and wellness. This includes:  A yearly physical exam. This is also called an annual wellness visit.  Regular dental and eye exams.  Immunizations.  Screening for certain conditions.  Healthy lifestyle choices, such as: ? Eating a healthy diet. ? Getting regular exercise. ? Not using drugs or products that contain nicotine and tobacco. ? Limiting alcohol use. What can I expect for my preventive care visit? Physical exam Your health care provider will check your:  Height and weight. These may be used to calculate your BMI (body mass index). BMI is a measurement that tells if you are at a healthy weight.  Heart rate and blood pressure.  Body temperature.  Skin for abnormal spots. Counseling Your health care provider may ask you questions about your:  Past medical problems.  Family's medical history.  Alcohol, tobacco, and drug use.  Emotional well-being.  Home life and relationship well-being.  Sexual activity.  Diet, exercise, and sleep habits.  History of falls.  Memory and ability to understand (cognition).  Work and work Astronomer.  Pregnancy and menstrual history.  Access to firearms. What immunizations do I need? Vaccines are usually given at various ages, according to a schedule. Your health care provider will recommend vaccines for you based on your age, medical history, and lifestyle or other factors, such as travel or  where you work.   What tests do I need? Blood tests  Lipid and cholesterol levels. These may be checked every 5 years, or more often depending on your overall health.  Hepatitis C test.  Hepatitis B test. Screening  Lung cancer screening. You may have this screening every year starting at age 32 if you have a 30-pack-year history of smoking and currently smoke or have quit within the past 15 years.  Colorectal cancer screening. ? All adults should have this screening starting at age 109 and continuing until age 76. ? Your health care provider may recommend screening at age 68 if you are at increased risk. ? You will have tests every 1-10 years, depending on your results and the type of screening test.  Diabetes screening. ? This is done by checking your blood sugar (glucose) after you have not eaten for a while (fasting). ? You may have this done every 1-3 years.  Mammogram. ? This may be done every 1-2 years. ? Talk with your health care provider about how often you should have regular mammograms.  Abdominal aortic aneurysm (AAA) screening. You may need this if you are a current or former smoker.  BRCA-related cancer screening. This may be done if you have a family history of breast, ovarian, tubal, or peritoneal cancers. Other tests  STD (sexually transmitted disease) testing, if you are at risk.  Bone density scan. This is done to screen for osteoporosis. You may have this done starting at age 66. Talk with your health care provider about your test results, treatment options, and if necessary, the need for more  tests. Follow these instructions at home: Eating and drinking  Eat a diet that includes fresh fruits and vegetables, whole grains, lean protein, and low-fat dairy products. Limit your intake of foods with high amounts of sugar, saturated fats, and salt.  Take vitamin and mineral supplements as recommended by your health care provider.  Do not drink alcohol if your  health care provider tells you not to drink.  If you drink alcohol: ? Limit how much you have to 0-1 drink a day. ? Be aware of how much alcohol is in your drink. In the [place] one drink equals one 12 oz bottle of beer (355 mL), one 5 oz glass of wine (148 mL), or one 1 oz glass of hard liquor (44 mL).   Lifestyle  Take daily care of your teeth and gums. Brush your teeth every morning and night with fluoride toothpaste. Floss one time each day.  Stay active. Exercise for at least 30 minutes 5 or more days each week.  Do not use any products that contain nicotine or tobacco, such as cigarettes, e-cigarettes, and chewing tobacco. If you need help quitting, ask your health care provider.  Do not use drugs.  If you are sexually active, practice safe sex. Use a condom or other form of protection in order to prevent STIs (sexually transmitted infections).  Talk with your health care provider about taking a low-dose aspirin or statin.  Find healthy ways to cope with stress, such as: ? Meditation, yoga, or listening to music. ? Journaling. ? Talking to a trusted person. ? Spending time with friends and family. Safety  Always wear your seat belt while driving or riding in a vehicle.  Do not drive: ? If you have been drinking alcohol. Do not ride with someone who has been drinking. ? When you are tired or distracted. ? While texting.  Wear a helmet and other protective equipment during sports activities.  If you have firearms in your house, make sure you follow all gun safety procedures. What's next?  Visit your health care provider once a year for an annual wellness visit.  Ask your health care provider how often you should have your eyes and teeth checked.  Stay up to date on all vaccines. This information is not intended to replace advice given to you by your health care provider. Make sure you discuss any questions you have with your health care provider. Document Revised:  01/22/2020 Document Reviewed: 01/25/2018 Elsevier Patient Education  2021 [employer].

## 2020-02-24 NOTE — Progress Notes (Signed)
Subjective  Chief Complaint  Patient presents with  . Annual Exam    fasting  . Hypertension    BP checks at home around 120-130's/80's   . Hyperlipidemia    HPI: [name] is a 75 y.o. female who presents to [facility] [place] today for a Female Wellness Visit. She also has the concerns and/or needs as listed above in the chief complaint. These will be addressed in addition to the Health Maintenance Visit.   Wellness Visit: annual visit with health maintenance review and exam without Pap   HM: due mammo and dexa now. imms up to date. Feeling well.  Chronic disease f/u and/or acute problem visit: (deemed necessary to be done in addition to the wellness visit):  HTN: Feeling well. Taking medications w/o adverse effects. No symptoms of CHF, angina; no palpitations, sob, cp or lower extremity edema. Compliant with meds.   HLD on statin: tolerates well. Fasting for recheck.   H/o IFG: no sxs of hyperglycemia.   H/o mild hashimotos': no sxs now.   Mood: saw dr. Earnest Conroy and is improved. Learning how to separate herself from her son's decisions/problems. No more anxiety or depressive sxs.   Assessment  1. Annual physical exam   2. Essential hypertension   3. Mixed hyperlipidemia   4. IFG (impaired fasting glucose)   5. Asymptomatic menopausal state   6. Encounter for screening mammogram for breast cancer   7. Ectopic atrial tachycardia (HCC) Chronic     Plan  Female Wellness Visit:  Age appropriate Health Maintenance and Prevention measures were discussed with patient. Included topics are cancer screening recommendations, ways to keep healthy (see AVS) including dietary and exercise recommendations, regular eye and dental care, use of seat belts, and avoidance of moderate alcohol use and tobacco use. mammo and dexa ordered   BMI: discussed patient's BMI and encouraged positive lifestyle modifications to help get to or maintain a target BMI.  HM  needs and immunizations were addressed and ordered. See below for orders. See HM and immunization section for updates. utd  Routine labs and screening tests ordered including cmp, cbc and lipids where appropriate.  Discussed recommendations regarding Vit D and calcium supplementation (see AVS)  Chronic disease management visit and/or acute problem visit:  HTN: controlled on full dose metoprolol. Check renal function and electrolytes.  HLD: recheck fasting labs on statin and lfts  No sxs of abnl thryoid.monitoring  Atrial tach well controlled on bb. Refilled.   IFG: walks daily. Healthy diet. Stable weight.   Follow up: 6 mo for HTN  Orders Placed This Encounter  Procedures  . DG Bone Density  . MM DIGITAL SCREENING BILATERAL  . CBC with Differential/Platelet  . Comprehensive metabolic panel  . Lipid panel  . Hemoglobin A1c  . TSH   Meds ordered this encounter  Medications  . DISCONTD: metoprolol succinate (TOPROL-XL) 25 MG 24 hr tablet    Sig: Take 0.5 tablets (12.5 mg total) by mouth daily.    Dispense:  45 tablet    Refill:  3    Please keep on file for next refill due.  . metoprolol succinate (TOPROL-XL) 25 MG 24 hr tablet    Sig: Take 1 tablet (25 mg total) by mouth daily.    Dispense:  90 tablet    Refill:  3    Please disregard prior order for 12.5mg  daily and please keep on file for next refill due in March. Thanks.  Lifestyle: Body mass index is 26.63 kg/m. Wt Readings from Last 3 Encounters:  02/24/20 145 lb 9.6 oz (66 kg)  02/09/20 145 lb (65.8 kg)  11/27/19 145 lb 3.2 oz (65.9 kg)     Patient Active Problem List   Diagnosis Date Noted  . Polycythemia 02/25/2019    2016 noted, then improved until 2017: persistently elevated hgb since. Last 16.5; evaluated by hematology 2021: neg JAK2. High "Normal" hgb, no further eval or treatment needed.    . IFG (impaired fasting glucose) 02/22/2019  . Ectopic atrial tachycardia (HCC) 02/21/2019  . History  of Hashimoto thyroiditis 11/17/2017    Lab Results  Component Value Date   TSH 2.17 11/17/2017   FREET4 0.87 11/17/2017    Lab Results  Component Value Date   TSH 2.17 11/17/2017   TSH 2.72 09/08/2017   TSH 5.20 (H) 11/21/2016   TSH 3.813 01/29/2015   FREET4 0.87 11/17/2017   FREET4 0.83 11/21/2016      . Seasonal allergies   . GERD (gastroesophageal reflux disease)   . Vitamin D deficiency 03/06/2015  . Mixed hyperlipidemia   . Essential hypertension 01/14/2013   Health Maintenance  Topic Date Due  . MAMMOGRAM  02/26/2020  . DEXA SCAN  04/20/2020  . TETANUS/TDAP  11/21/2023  . COLONOSCOPY (Pts 45-61yrs Insurance coverage will need to be confirmed)  05/21/2026  . INFLUENZA VACCINE  Completed  . COVID-19 Vaccine  Completed  . Hepatitis C Screening  Completed  . PNA vac Low Risk Adult  Completed   Immunization History  Administered Date(s) Administered  . Fluad Quad(high Dose 65+) 11/19/2018, 11/27/2019  . Influenza, High Dose Seasonal PF 11/21/2016, 11/17/2017  . Influenza,inj,Quad PF,6+ Mos 01/05/2016  . PFIZER SARS-COV-2 Vaccination 03/13/2019, 04/03/2019, 12/19/2019  . Pneumococcal Conjugate-13 12/26/2014  . Pneumococcal Polysaccharide-23 12/28/2012  . Tdap 11/20/2013  . Zoster Recombinat (Shingrix) 12/21/2017, 02/26/2018   We updated and reviewed the patient's past history in detail and it is documented below. Allergies: Patient has No Known Allergies. Past Medical History Patient  has a past medical history of Arthritis, Chest pain at rest (01/14/2013), Complication of anesthesia, Fracture of tibia and fibula (01/27/2015), GERD (gastroesophageal reflux disease), Hashimoto's thyroiditis (11/17/2017), High cholesterol, Hypertension, PONV (postoperative nausea and vomiting), Scopalamine controls, Seasonal allergies, Subclinical hypothyroidism (11/27/2016), Syndesmotic disruption of right ankle (03/06/2015), and Vitamin D deficiency (03/06/2015). Past Surgical  History Patient  has a past surgical history that includes ORIF tibia fracture (Right, 01/29/2015); Bunionectomy (Bilateral); Colonoscopy; Hardware Removal (Right, 12/10/2015); and Myomectomy. Family History: Patient family history includes CVA in her mother, sister, sister, and sister; Diabetes in her father; Heart attack in her sister; Heart disease in her father. Social History:  Patient  reports that she has never smoked. She has never used smokeless tobacco. She reports current alcohol use of about 3.0 - 4.0 standard drinks of alcohol per week. She reports that she does not use drugs.  Review of Systems: Constitutional: negative for fever or malaise Ophthalmic: negative for photophobia, double vision or loss of vision Cardiovascular: negative for chest pain, dyspnea on exertion, or new LE swelling Respiratory: negative for SOB or persistent cough Gastrointestinal: negative for abdominal pain, change in bowel habits or melena Genitourinary: negative for dysuria or gross hematuria, no abnormal uterine bleeding or disharge Musculoskeletal: negative for new gait disturbance or muscular weakness Integumentary: negative for new or persistent rashes, no breast lumps Neurological: negative for TIA or stroke symptoms Psychiatric: negative for SI or delusions Allergic/Immunologic: negative for hives  Patient Care Team    Relationship Specialty Notifications Start End  Willow Ora, MD PCP - General Family Medicine  02/21/19   Carlus Pavlov, MD Consulting Physician Internal Medicine  08/15/18   Lyn Records, MD Consulting Physician Cardiology  08/15/18     Objective  Vitals: BP 130/82   Pulse (!) 58   Temp 98.2 F (36.8 C) (Temporal)   Ht 5\' 2"  (1.575 m)   Wt 145 lb 9.6 oz (66 kg)   SpO2 99%   BMI 26.63 kg/m  General:  Well developed, well nourished, no acute distress  Psych:  Alert and orientedx3,normal mood and affect HEENT:  Normocephalic, atraumatic, non-icteric sclera,   supple neck without adenopathy, mass or thyromegaly Cardiovascular:  Normal S1, S2, RRR without gallop, rub or murmur Respiratory:  Good breath sounds bilaterally, CTAB with normal respiratory effort Gastrointestinal: normal bowel sounds, soft, non-tender, no noted masses. No HSM MSK: no deformities, contusions. Joints are without erythema or swelling.  Skin:  Warm, no rashes or suspicious lesions noted Neurologic:    Mental status is normal. CN 2-11 are normal. Gross motor and sensory exams are normal. Normal gait. Very fine tremor Breast Exam: No mass, skin retraction or nipple discharge is appreciated in either breast. No axillary adenopathy. Fibrocystic changes are not noted    Commons side effects, risks, benefits, and alternatives for medications and treatment plan prescribed today were discussed, and the patient expressed understanding of the given instructions. Patient is instructed to call or message via MyChart if he/she has any questions or concerns regarding our treatment plan. No barriers to understanding were identified. We discussed Red Flag symptoms and signs in detail. Patient expressed understanding regarding what to do in case of urgent or emergency type symptoms.   Medication list was reconciled, printed and provided to the patient in AVS. Patient instructions and summary information was reviewed with the patient as documented in the AVS. This note was prepared with assistance of Dragon voice recognition software. Occasional wrong-word or sound-a-like substitutions may have occurred due to the inherent limitations of voice recognition software  This visit occurred during the SARS-CoV-2 public health emergency.  Safety protocols were in place, including screening questions prior to the visit, additional usage of staff PPE, and extensive cleaning of exam room while observing appropriate contact time as indicated for disinfecting solutions.

## 2020-02-26 ENCOUNTER — Other Ambulatory Visit: Payer: Self-pay | Admitting: Family Medicine

## 2020-02-26 DIAGNOSIS — Z1231 Encounter for screening mammogram for malignant neoplasm of breast: Secondary | ICD-10-CM

## 2020-03-09 ENCOUNTER — Ambulatory Visit
Admission: RE | Admit: 2020-03-09 | Discharge: 2020-03-09 | Disposition: A | Discharge status: Home / Self Care | Payer: Medicare PPO | Source: Ambulatory Visit | Attending: Family Medicine | Admitting: Family Medicine

## 2020-03-09 ENCOUNTER — Other Ambulatory Visit: Payer: Self-pay

## 2020-03-09 DIAGNOSIS — Z1231 Encounter for screening mammogram for malignant neoplasm of breast: Secondary | ICD-10-CM

## 2020-03-09 DIAGNOSIS — E2839 Other primary ovarian failure: Secondary | ICD-10-CM

## 2020-03-09 DIAGNOSIS — Z780 Asymptomatic menopausal state: Secondary | ICD-10-CM | POA: Diagnosis not present

## 2020-03-11 ENCOUNTER — Encounter: Payer: Self-pay | Admitting: Family Medicine

## 2020-03-11 DIAGNOSIS — Z13820 Encounter for screening for osteoporosis: Secondary | ICD-10-CM | POA: Insufficient documentation

## 2020-05-11 ENCOUNTER — Other Ambulatory Visit: Payer: Self-pay | Admitting: Family Medicine

## 2020-05-24 ENCOUNTER — Encounter: Payer: Self-pay | Admitting: Family Medicine

## 2020-05-25 ENCOUNTER — Other Ambulatory Visit: Payer: Self-pay

## 2020-05-25 MED ORDER — ROSUVASTATIN CALCIUM 20 MG PO TABS: 20.0000 mg | ORAL_TABLET | Freq: Every day | ORAL | 3 refills | Status: DC

## 2020-06-29 DIAGNOSIS — H2513 Age-related nuclear cataract, bilateral: Secondary | ICD-10-CM | POA: Diagnosis not present

## 2020-08-06 ENCOUNTER — Ambulatory Visit (INDEPENDENT_AMBULATORY_CARE_PROVIDER_SITE_OTHER): Payer: Medicare PPO

## 2020-08-06 DIAGNOSIS — Z0000 Encounter for general adult medical examination without abnormal findings: Secondary | ICD-10-CM | POA: Diagnosis not present

## 2020-08-06 NOTE — Progress Notes (Addendum)
Virtual Visit via Telephone Note  I connected with  [name] on 08/06/20 at  2:30 PM EDT by telephone and verified that I am speaking with the correct person using two identifiers.  Medicare Annual Wellness visit completed telephonically due to Covid-19 pandemic.   Persons participating in this call: This Health Coach and this patient.   Location: Patient: Home Provider: Office   I discussed the limitations, risks, security and privacy concerns of performing an evaluation and management service by telephone and the availability of in person appointments. The patient expressed understanding and agreed to proceed.  Unable to perform video visit due to video visit attempted and failed and/or patient does not have video capability.   Some vital signs may be absent or patient reported.   Alyssa Schlein, LPN   Subjective:   [name] is a 75 y.o. female who presents for Medicare Annual (Subsequent) preventive examination.  Review of Systems     Cardiac Risk Factors include: advanced age (>62men, >65 women);hypertension;dyslipidemia     Objective:    Today's Vitals   08/06/20 1430  PainSc: 5    There is no height or weight on file to calculate BMI.  Advanced Directives 08/06/2020 04/12/2019 02/28/2017 12/10/2015 01/27/2015  Does Patient Have a Medical Advance Directive? Yes Yes Yes Yes No  Type of Advance Directive - Living will;Healthcare Power of Attorney Living will;Healthcare Power of [employer] Power of [place];Living will -  Does patient want to make changes to medical advance directive? Yes (MAU/Ambulatory/Procedural Areas - Information given) No - Patient declined No - Patient declined No - Patient declined -  Copy of Healthcare Power of Attorney in Chart? - No - copy requested No - copy requested No - copy requested -    Current Medications (verified) Outpatient Encounter Medications as of 08/06/2020  Medication Sig   aspirin 81 MG tablet Take  81 mg by mouth daily.   cholecalciferol (VITAMIN D3) 25 MCG (1000 UT) tablet Take by mouth daily.    Coenzyme Q10 (COQ10) 100 MG CAPS Take 100 mg by mouth daily.   hydrochlorothiazide (HYDRODIURIL) 25 MG tablet TAKE 1 TABLET EVERY DAY   Ketotifen Fumarate (ALLERGY EYE DROPS OP) Apply 1 drop to eye daily as needed (allergies).   metoprolol succinate (TOPROL-XL) 25 MG 24 hr tablet Take 1 tablet (25 mg total) by mouth daily.   Multiple Vitamin (MULTIVITAMIN) tablet Take 1 tablet by mouth daily.   Omega-3 Fatty Acids (FISH OIL) 1000 MG CAPS Take by mouth daily.   rosuvastatin (CRESTOR) 20 MG tablet Take 1 tablet (20 mg total) by mouth daily.   Selenium 200 MCG CAPS Take 1 capsule by mouth daily.   TURMERIC PO Take 1,000 mg by mouth 2 (two) times daily.   vitamin C (VITAMIN C) 500 MG tablet Take 1 tablet (500 mg total) by mouth daily.   fluticasone (FLONASE) 50 MCG/ACT nasal spray Place 2 sprays into both nostrils as needed for allergies or rhinitis. (Patient not taking: Reported on 08/06/2020)   No facility-administered encounter medications on file as of 08/06/2020.    Allergies (verified) Patient has no known allergies.   History: Past Medical History:  Diagnosis Date   Arthritis    Chest pain at rest 01/14/2013   Complication of anesthesia    Fracture of tibia and fibula 01/27/2015   GERD (gastroesophageal reflux disease)    Hashimoto's thyroiditis 11/17/2017   Lab Results Component Value Date  TSH 2.17 11/17/2017  FREET4 0.87  11/17/2017   Lab Results Component Value Date  TSH 2.17 11/17/2017  TSH 2.72 09/08/2017  TSH 5.20 (H) 11/21/2016  TSH 3.813 01/29/2015  FREET4 0.87 11/17/2017  FREET4 0.83 11/21/2016     High cholesterol    Hypertension    PONV (postoperative nausea and vomiting), Scopalamine controls    Seasonal allergies    Subclinical hypothyroidism 11/27/2016   Syndesmotic disruption of right ankle 03/06/2015   Vitamin D deficiency 03/06/2015   Past Surgical History:   Procedure Laterality Date   BUNIONECTOMY Bilateral    COLONOSCOPY     HARDWARE REMOVAL Right 12/10/2015   Procedure: HARDWARE REMOVAL RIGHT ANKLE;  Surgeon: Myrene GalasMichael Handy, MD;  [facility];  Service: Orthopedics;  Laterality: Right;   MYOMECTOMY     ORIF TIBIA FRACTURE Right 01/29/2015   Procedure: OPEN REDUCTION INTERNAL FIXATION (ORIF) RIGHT TIBIA  AND FIBULA FRACTURES;  Surgeon: Myrene GalasMichael Handy, MD;  Location: [facility] OR;  Service: Orthopedics;  Laterality: Right;   Family History  Problem Relation Age of Onset   CVA Mother    Heart disease Father    Diabetes Father    Heart attack Sister    CVA Sister    CVA Sister    CVA Sister    Social History   Socioeconomic History   Marital status: Married    Spouse name: Not on file   Number of children: Not on file   Years of education: Not on file   Highest education level: Not on file  Occupational History   Occupation: Retired Runner, broadcasting/film/videoTeacher  Tobacco Use   Smoking status: Never   Smokeless tobacco: Never  Vaping Use   Vaping Use: Never used  Substance and Sexual Activity   Alcohol use: Yes    Alcohol/week: 3.0 - 4.0 standard drinks    Types: 3 - 4 Glasses of wine per week   Drug use: No   Sexual activity: Yes    Birth control/protection: Post-menopausal  Other Topics Concern   Not on file  Social History Narrative   Not on file   Social Determinants of Health   Financial Resource Strain: Low Risk    Difficulty of Paying Living Expenses: Not hard at all  Food Insecurity: No Food Insecurity   Worried About Programme researcher, broadcasting/film/videounning Out of Food in the Last Year: Never true   Ran Out of Food in the Last Year: Never true  Transportation Needs: No Transportation Needs   Lack of Transportation (Medical): No   Lack of Transportation (Non-Medical): No  Physical Activity: Sufficiently Active   Days of Exercise per Week: 6 days   Minutes of Exercise per Session: 60 min  Stress: No Stress Concern Present   Feeling of Stress : Only a little   Social Connections: Moderately Isolated   Frequency of Communication with Friends and Family: More than three times a week   Frequency of Social Gatherings with Friends and Family: More than three times a week   Attends Religious Services: Never   Database administratorActive Member of Clubs or Organizations: No   Attends Engineer, structuralClub or Organization Meetings: Never   Marital Status: Married    Tobacco Counseling Counseling given: Not Answered   Clinical Intake:  Pre-visit preparation completed: Yes  Pain : 0-10 (stiff neck and lower back) Pain Score: 5  Pain Type: Chronic pain Pain Location: Back Pain Descriptors / Indicators: Aching, Tightness Pain Onset: More than a month ago Pain Frequency: Intermittent     BMI - recorded: 26.63 Nutritional Risks: None Diabetes:  No  How often do you need to have someone help you when you read instructions, pamphlets, or other written materials from your doctor or pharmacy?: 1 - Never  Diabetic?No  Interpreter Needed?: No  Information entered by :: Lanier Ensign, LPN   Activities of Daily Living In your present state of health, do you have any difficulty performing the following activities: 08/06/2020 02/24/2020  Hearing? Y N  Comment right ear -  Vision? N N  Difficulty concentrating or making decisions? Y N  Comment make list to aid with memory -  Walking or climbing stairs? N N  Dressing or bathing? N N  Doing errands, shopping? N N  Preparing Food and eating ? N -  Using the Toilet? N -  In the past six months, have you accidently leaked urine? N -  Do you have problems with loss of bowel control? N -  Managing your Medications? N -  Managing your Finances? N -  Housekeeping or managing your Housekeeping? N -  Some recent data might be hidden    Patient Care Team: Willow Ora, MD as PCP - General (Family Medicine) Carlus Pavlov, MD as Consulting Physician (Internal Medicine) Lyn Records, MD as Consulting Physician  (Cardiology)  Indicate any recent Medical Services you may have received from other than [facility] providers in the past year (date may be approximate).     Assessment:   This is a routine wellness examination for Raynie.  Hearing/Vision screen Hearing Screening - Comments:: Pt hearing loss in the right ear  Vision Screening - Comments:: Pt follows up with Dr Cherlynn Polo for annual eye exams   Dietary issues and exercise activities discussed: Current Exercise Habits: Home exercise routine, Type of exercise: walking, Time (Minutes): 60, Frequency (Times/Week): 6, Weekly Exercise (Minutes/Week): 360   Goals Addressed             This Visit's Progress    Patient Stated       Lose weight and stay away from sweets         Depression Screen PHQ 2/9 Scores 08/06/2020 02/24/2020 04/12/2019 08/15/2018 02/28/2017 05/09/2016  PHQ - 2 Score 0 0 0 0 0 0  PHQ- 9 Score - - - 1 0 -    Fall Risk Fall Risk  08/06/2020 02/24/2020 04/12/2019 02/21/2019 08/15/2018  Falls in the past year? 0 0 0 0 0  Number falls in past yr: 0 0 0 - -  Injury with Fall? 0 0 0 - -  Risk for fall due to : Impaired vision - - - -  Follow up Falls prevention discussed - Falls evaluation completed;Education provided;Falls prevention discussed Falls evaluation completed -    FALL RISK PREVENTION PERTAINING TO THE HOME:  Any stairs in or around the home? Yes  If so, are there any without handrails? No  Home free of loose throw rugs in walkways, pet beds, electrical cords, etc? Yes  Adequate lighting in your home to reduce risk of falls? Yes   ASSISTIVE DEVICES UTILIZED TO PREVENT FALLS:  Life alert? No  Use of a cane, walker or w/c? No  Grab bars in the bathroom? Yes  Shower chair or bench in shower? Yes  Elevated toilet seat or a handicapped toilet? Yes   TIMED UP AND GO:  Was the test performed? No     Cognitive Function:     6CIT Screen 08/06/2020 04/12/2019  What Year? 0 points 0 points  What month? 0 points 0  points  What time? 0 points 0 points  Count back from 20 0 points 0 points  Months in reverse 0 points 0 points  Repeat phrase 0 points 0 points  Total Score 0 0    Immunizations Immunization History  Administered Date(s) Administered   Fluad Quad(high Dose 65+) 11/19/2018, 11/27/2019   Influenza, High Dose Seasonal PF 11/21/2016, 11/17/2017   Influenza,inj,Quad PF,6+ Mos 01/05/2016   PFIZER(Purple Top)SARS-COV-2 Vaccination 03/13/2019, 04/03/2019, 12/19/2019   Pneumococcal Conjugate-13 12/26/2014   Pneumococcal Polysaccharide-23 12/28/2012   Tdap 11/20/2013   Zoster Recombinat (Shingrix) 12/21/2017, 02/26/2018    TDAP status: Up to date  Flu Vaccine status: Up to date  Pneumococcal vaccine status: Up to date  Covid-19 vaccine status: Completed vaccines  Qualifies for Shingles Vaccine? Yes   Zostavax completed Yes   Shingrix Completed?: Yes  Screening Tests Health Maintenance  Topic Date Due   COVID-19 Vaccine (4 - Booster for Pfizer series) 04/17/2020   INFLUENZA VACCINE  09/14/2020   MAMMOGRAM  03/09/2021   TETANUS/TDAP  11/21/2023   DEXA SCAN  03/09/2025   COLONOSCOPY (Pts 45-31yrs Insurance coverage will need to be confirmed)  05/21/2026   Hepatitis C Screening  Completed   PNA vac Low Risk Adult  Completed   Zoster Vaccines- Shingrix  Completed   HPV VACCINES  Aged Out    Health Maintenance  Health Maintenance Due  Topic Date Due   COVID-19 Vaccine (4 - Booster for Pfizer series) 04/17/2020    Colorectal cancer screening: Type of screening: Colonoscopy. Completed 05/20/16. Repeat every 10 years  Mammogram status: Completed 03/09/20. Repeat every year  Bone Density status: Completed 03/09/20. Results reflect: Bone density results: NORMAL. Repeat every 5 years.   Additional Screening:  Hepatitis C Screening:  Completed 11/21/16  Vision Screening: Recommended annual ophthalmology exams for early detection of glaucoma and other disorders of the eye. Is  the patient up to date with their annual eye exam?  Yes  Who is the provider or what is the name of the office in which the patient attends annual eye exams? Dr Cherlynn Polo  If pt is not established with a provider, would they like to be referred to a provider to establish care? No .   Dental Screening: Recommended annual dental exams for proper oral hygiene  Community Resource Referral / Chronic Care Management: CRR required this visit?  No   CCM required this visit?  No      Plan:     I have personally reviewed and noted the following in the patient's chart:   Medical and social history Use of alcohol, tobacco or illicit drugs  Current medications and supplements including opioid prescriptions.  Functional ability and status Nutritional status Physical activity Advanced directives List of other physicians Hospitalizations, surgeries, and ER visits in previous 12 months Vitals Screenings to include cognitive, depression, and falls Referrals and appointments  In addition, I have reviewed and discussed with patient certain preventive protocols, quality metrics, and best practice recommendations. A written personalized care plan for preventive services as well as general preventive health recommendations were provided to patient.     Alyssa Schlein, LPN   [id number]/54/[id number]   Nurse Notes: None

## 2020-08-06 NOTE — Patient Instructions (Signed)
[name] , Thank you for taking time to come for your Medicare Wellness Visit. I appreciate your ongoing commitment to your health goals. Please review the following plan we discussed and let me know if I can assist you in the future.   Screening recommendations/referrals: Colonoscopy: Done 05/20/16 repeat 05/21/26 Mammogram: Done 03/09/20 repeat every year Bone Density: Done 03/09/20 repeat 03/09/25 Recommended yearly ophthalmology/optometry visit for glaucoma screening and checkup Recommended yearly dental visit for hygiene and checkup  Vaccinations: Influenza vaccine: Due 09/14/20 Pneumococcal vaccine: completed  Tdap vaccine: Done 11/20/13 repeat 11/21/23 Shingles vaccine: Completed 12/21/17 & 02/26/18   Covid-19:Completed 1/27, 2/17, & 12/19/19  Advanced directives: Advance directive discussed with you today. I have provided a copy for you to complete at home and have notarized. Once this is complete please bring a copy in to our office so we can scan it into your chart.  Conditions/risks identified: lose weight   Next appointment: Follow up in one year for your annual wellness visit    Preventive Care 65 Years and Older, Female Preventive care refers to lifestyle choices and visits with your health care provider that can promote health and wellness. What does preventive care include? A yearly physical exam. This is also called an annual well check. Dental exams once or twice a year. Routine eye exams. Ask your health care provider how often you should have your eyes checked. Personal lifestyle choices, including: Daily care of your teeth and gums. Regular physical activity. Eating a healthy diet. Avoiding tobacco and drug use. Limiting alcohol use. Practicing safe sex. Taking low-dose aspirin every day. Taking vitamin and mineral supplements as recommended by your health care provider. What happens during an annual well check? The services and screenings done by your health care  provider during your annual well check will depend on your age, overall health, lifestyle risk factors, and family history of disease. Counseling  Your health care provider may ask you questions about your: Alcohol use. Tobacco use. Drug use. Emotional well-being. Home and relationship well-being. Sexual activity. Eating habits. History of falls. Memory and ability to understand (cognition). Work and work Astronomer. Reproductive health. Screening  You may have the following tests or measurements: Height, weight, and BMI. Blood pressure. Lipid and cholesterol levels. These may be checked every 5 years, or more frequently if you are over 44 years old. Skin check. Lung cancer screening. You may have this screening every year starting at age 68 if you have a 30-pack-year history of smoking and currently smoke or have quit within the past 15 years. Fecal occult blood test (FOBT) of the stool. You may have this test every year starting at age 43. Flexible sigmoidoscopy or colonoscopy. You may have a sigmoidoscopy every 5 years or a colonoscopy every 10 years starting at age 48. Hepatitis C blood test. Hepatitis B blood test. Sexually transmitted disease (STD) testing. Diabetes screening. This is done by checking your blood sugar (glucose) after you have not eaten for a while (fasting). You may have this done every 1-3 years. Bone density scan. This is done to screen for osteoporosis. You may have this done starting at age 3. Mammogram. This may be done every 1-2 years. Talk to your health care provider about how often you should have regular mammograms. Talk with your health care provider about your test results, treatment options, and if necessary, the need for more tests. Vaccines  Your health care provider may recommend certain vaccines, such as: Influenza vaccine. This is recommended  every year. Tetanus, diphtheria, and acellular pertussis (Tdap, Td) vaccine. You may need a Td  booster every 10 years. Zoster vaccine. You may need this after age 71. Pneumococcal 13-valent conjugate (PCV13) vaccine. One dose is recommended after age 60. Pneumococcal polysaccharide (PPSV23) vaccine. One dose is recommended after age 79. Talk to your health care provider about which screenings and vaccines you need and how often you need them. This information is not intended to replace advice given to you by your health care provider. Make sure you discuss any questions you have with your health care provider. Document Released: 02/27/2015 Document Revised: 10/21/2015 Document Reviewed: 12/02/2014 Elsevier Interactive Patient Education  2017 [place] Prevention in the Home Falls can cause injuries. They can happen to people of all ages. There are many things you can do to make your home safe and to help prevent falls. What can I do on the outside of my home? Regularly fix the edges of walkways and driveways and fix any cracks. Remove anything that might make you trip as you walk through a door, such as a raised step or threshold. Trim any bushes or trees on the path to your home. Use bright outdoor lighting. Clear any walking paths of anything that might make someone trip, such as rocks or tools. Regularly check to see if handrails are loose or broken. Make sure that both sides of any steps have handrails. Any raised decks and porches should have guardrails on the edges. Have any leaves, snow, or ice cleared regularly. Use sand or salt on walking paths during winter. Clean up any spills in your garage right away. This includes oil or grease spills. What can I do in the bathroom? Use night lights. Install grab bars by the toilet and in the tub and shower. Do not use towel bars as grab bars. Use non-skid mats or decals in the tub or shower. If you need to sit down in the shower, use a plastic, non-slip stool. Keep the floor dry. Clean up any water that spills on the floor  as soon as it happens. Remove soap buildup in the tub or shower regularly. Attach bath mats securely with double-sided non-slip rug tape. Do not have throw rugs and other things on the floor that can make you trip. What can I do in the bedroom? Use night lights. Make sure that you have a light by your bed that is easy to reach. Do not use any sheets or blankets that are too big for your bed. They should not hang down onto the floor. Have a firm chair that has side arms. You can use this for support while you get dressed. Do not have throw rugs and other things on the floor that can make you trip. What can I do in the kitchen? Clean up any spills right away. Avoid walking on wet floors. Keep items that you use a lot in easy-to-reach places. If you need to reach something above you, use a strong step stool that has a grab bar. Keep electrical cords out of the way. Do not use floor polish or wax that makes floors slippery. If you must use wax, use non-skid floor wax. Do not have throw rugs and other things on the floor that can make you trip. What can I do with my stairs? Do not leave any items on the stairs. Make sure that there are handrails on both sides of the stairs and use them. Fix handrails that are broken  or loose. Make sure that handrails are as long as the stairways. Check any carpeting to make sure that it is firmly attached to the stairs. Fix any carpet that is loose or worn. Avoid having throw rugs at the top or bottom of the stairs. If you do have throw rugs, attach them to the floor with carpet tape. Make sure that you have a light switch at the top of the stairs and the bottom of the stairs. If you do not have them, ask someone to add them for you. What else can I do to help prevent falls? Wear shoes that: Do not have high heels. Have rubber bottoms. Are comfortable and fit you well. Are closed at the toe. Do not wear sandals. If you use a stepladder: Make sure that it is  fully opened. Do not climb a closed stepladder. Make sure that both sides of the stepladder are locked into place. Ask someone to hold it for you, if possible. Clearly mark and make sure that you can see: Any grab bars or handrails. First and last steps. Where the edge of each step is. Use tools that help you move around (mobility aids) if they are needed. These include: Canes. Walkers. Scooters. Crutches. Turn on the lights when you go into a dark area. Replace any light bulbs as soon as they burn out. Set up your furniture so you have a clear path. Avoid moving your furniture around. If any of your floors are uneven, fix them. If there are any pets around you, be aware of where they are. Review your medicines with your doctor. Some medicines can make you feel dizzy. This can increase your chance of falling. Ask your doctor what other things that you can do to help prevent falls. This information is not intended to replace advice given to you by your health care provider. Make sure you discuss any questions you have with your health care provider. Document Released: 11/27/2008 Document Revised: 07/09/2015 Document Reviewed: 03/07/2014 Elsevier Interactive Patient Education  2017 [employer].

## 2020-08-24 ENCOUNTER — Ambulatory Visit: Payer: Medicare PPO | Admitting: Family Medicine

## 2020-08-24 ENCOUNTER — Other Ambulatory Visit: Payer: Self-pay

## 2020-08-24 ENCOUNTER — Encounter: Payer: Self-pay | Admitting: Family Medicine

## 2020-08-24 VITALS — BP 126/82 | HR 52 | Temp 97.7°F | Wt 150.8 lb

## 2020-08-24 DIAGNOSIS — R7301 Impaired fasting glucose: Secondary | ICD-10-CM | POA: Diagnosis not present

## 2020-08-24 DIAGNOSIS — M542 Cervicalgia: Secondary | ICD-10-CM

## 2020-08-24 DIAGNOSIS — I471 Supraventricular tachycardia: Secondary | ICD-10-CM

## 2020-08-24 DIAGNOSIS — I10 Essential (primary) hypertension: Secondary | ICD-10-CM | POA: Diagnosis not present

## 2020-08-24 DIAGNOSIS — H8110 Benign paroxysmal vertigo, unspecified ear: Secondary | ICD-10-CM

## 2020-08-24 MED ORDER — CYCLOBENZAPRINE HCL 10 MG PO TABS: 10.0000 mg | ORAL_TABLET | Freq: Every evening | ORAL | 0 refills | Status: DC | PRN

## 2020-08-24 MED ORDER — MELOXICAM 7.5 MG PO TABS: 7.5000 mg | ORAL_TABLET | Freq: Every day | ORAL | 2 refills | Status: DC | PRN

## 2020-08-24 NOTE — Progress Notes (Signed)
Subjective  CC:  Chief Complaint  Patient presents with   Hypertension    Brought in home readings, low to mid 120's/70-80's     Neck Pain    Ongoing over the past few months, possibly from the way she sleeps    Dizziness    Started when laying in her bed, became nauseous, ended up sitting up on the couch and began to feel better, took about a week to "feel normal" again  Was seen by ENT, who states it was likely a virus, no abnormal findings     HPI: [name] is a 75 y.o. female who presents to the office today to address the problems listed above in the chief complaint. Hypertension f/u: Control is good . Pt reports she is doing well. taking medications as instructed, no medication side effects noted, no TIAs, no chest pain on exertion, no dyspnea on exertion, no swelling of ankles. Reports episode of positional vertigo back in June; lasted a week. No red flag sxs. Bp as normal throughout sxs. No neuro sxs. She denies adverse effects from his BP medications. Compliance with medication is good.  IFG: last a1c was normal. Minimally elevated fasting glucose. Eating well. Weight is stable.  Had 1 week h/o vertigo with head mvt. Better is sat still. Tried home "epley" maneuvers. Resolved. No paresis, headaches, or cranial nerve sxs. C/o months of intermittent left sided neck pain. Worse upon awakening. Tight muscles. No radicular sxs or UE weakness. No injury.   Assessment  1. Essential hypertension   2. IFG (impaired fasting glucose)   3. Ectopic atrial tachycardia (HCC)   4. Neck pain on left side   5. Benign paroxysmal positional vertigo, unspecified laterality      Plan   Hypertension f/u: BP control is well controlled. Cont hctz 25 and bb. Continue home monitoring Bppv: education given. Resolved.  Atrial tach: well controlled onbb Neck pain: msk. No radicular sxs. Meloxicam and mm relaxer x 1-2 weeks, then prn. Return if worsening. Consider xrays.  IFG: no sxs of  hyperglycemia.   Education regarding management of these chronic disease states was given. Management strategies discussed on successive visits include dietary and exercise recommendations, goals of achieving and maintaining IBW, and lifestyle modifications aiming for adequate sleep and minimizing stressors.   Follow up: Return in about 6 months (around 02/24/2021) for complete physical.  No orders of the defined types were placed in this encounter.  Meds ordered this encounter  Medications   meloxicam (MOBIC) 7.5 MG tablet    Sig: Take 1 tablet (7.5 mg total) by mouth daily as needed for pain.    Dispense:  30 tablet    Refill:  2   cyclobenzaprine (FLEXERIL) 10 MG tablet    Sig: Take 1 tablet (10 mg total) by mouth at bedtime as needed for muscle spasms.    Dispense:  30 tablet    Refill:  0       BP Readings from Last 3 Encounters:  08/24/20 126/82  02/24/20 130/82  02/09/20 133/80   Wt Readings from Last 3 Encounters:  08/24/20 150 lb 12.8 oz (68.4 kg)  02/24/20 145 lb 9.6 oz (66 kg)  02/09/20 145 lb (65.8 kg)    Lab Results  Component Value Date   CHOL 185 02/24/2020   CHOL 203 (H) 05/22/2019   CHOL 220 (H) 02/21/2019   Lab Results  Component Value Date   HDL 88.00 02/24/2020   HDL 93.50 05/22/2019  HDL 97.70 02/21/2019   Lab Results  Component Value Date   LDLCALC 84 02/24/2020   LDLCALC 97 05/22/2019   LDLCALC 111 (H) 02/21/2019   Lab Results  Component Value Date   TRIG 65.0 02/24/2020   TRIG 63.0 05/22/2019   TRIG 54.0 02/21/2019   Lab Results  Component Value Date   CHOLHDL 2 02/24/2020   CHOLHDL 2 05/22/2019   CHOLHDL 2 02/21/2019   No results found for: LDLDIRECT Lab Results  Component Value Date   CREATININE 0.79 02/24/2020   BUN 11 02/24/2020   NA 138 02/24/2020   K 3.6 02/24/2020   CL 101 02/24/2020   CO2 32 02/24/2020    The 10-year ASCVD risk score Denman George DC Jr., et al., 2013) is: 16%   Values used to calculate the score:      Age: 29 years     Sex: Female     Is Non-Hispanic African American: No     Diabetic: No     Tobacco smoker: No     Systolic Blood Pressure: 126 mmHg     Is BP treated: Yes     HDL Cholesterol: 88 mg/dL     Total Cholesterol: 185 mg/dL  I reviewed the patients updated PMH, FH, and SocHx.    Patient Active Problem List   Diagnosis Date Noted   Osteoporosis screening 03/11/2020   Polycythemia 02/25/2019   IFG (impaired fasting glucose) 02/22/2019   Ectopic atrial tachycardia (HCC) 02/21/2019   History of Hashimoto thyroiditis 11/17/2017   Seasonal allergies    GERD (gastroesophageal reflux disease)    Vitamin D deficiency 03/06/2015   Mixed hyperlipidemia    Essential hypertension 01/14/2013    Allergies: Patient has no known allergies.  Social History: Patient  reports that she has never smoked. She has never used smokeless tobacco. She reports current alcohol use of about 3.0 - 4.0 standard drinks of alcohol per week. She reports that she does not use drugs.  Current Meds  Medication Sig   aspirin 81 MG tablet Take 81 mg by mouth daily.   cholecalciferol (VITAMIN D3) 25 MCG (1000 UT) tablet Take by mouth daily.    Coenzyme Q10 (COQ10) 100 MG CAPS Take 100 mg by mouth daily.   cyclobenzaprine (FLEXERIL) 10 MG tablet Take 1 tablet (10 mg total) by mouth at bedtime as needed for muscle spasms.   fluticasone (FLONASE) 50 MCG/ACT nasal spray Place 2 sprays into both nostrils as needed for allergies or rhinitis.   hydrochlorothiazide (HYDRODIURIL) 25 MG tablet TAKE 1 TABLET EVERY DAY   Ketotifen Fumarate (ALLERGY EYE DROPS OP) Apply 1 drop to eye daily as needed (allergies).   meloxicam (MOBIC) 7.5 MG tablet Take 1 tablet (7.5 mg total) by mouth daily as needed for pain.   metoprolol succinate (TOPROL-XL) 25 MG 24 hr tablet Take 1 tablet (25 mg total) by mouth daily.   Multiple Vitamin (MULTIVITAMIN) tablet Take 1 tablet by mouth daily.   Omega-3 Fatty Acids (FISH OIL) 1000 MG  CAPS Take by mouth daily.   rosuvastatin (CRESTOR) 20 MG tablet Take 1 tablet (20 mg total) by mouth daily.   Selenium 200 MCG CAPS Take 1 capsule by mouth daily.   TURMERIC PO Take 1,000 mg by mouth 2 (two) times daily.   vitamin C (VITAMIN C) 500 MG tablet Take 1 tablet (500 mg total) by mouth daily.    Review of Systems: Cardiovascular: negative for chest pain, palpitations, leg swelling, orthopnea Respiratory: negative for  SOB, wheezing or persistent cough Gastrointestinal: negative for abdominal pain Genitourinary: negative for dysuria or gross hematuria  Objective  Vitals: BP 126/82   Pulse (!) 52   Temp 97.7 F (36.5 C) (Temporal)   Wt 150 lb 12.8 oz (68.4 kg)   SpO2 98%   BMI 27.58 kg/m  General: no acute distress  Psych:  Alert and oriented, normal mood and affect HEENT:  Normocephalic, atraumatic, supple neck . Tender left trap, FROM Cardiovascular:  RRR without murmur. no edema Respiratory:  Good breath sounds bilaterally, CTAB with normal respiratory effort Neurologic:   Mental status is normal Commons side effects, risks, benefits, and alternatives for medications and treatment plan prescribed today were discussed, and the patient expressed understanding of the given instructions. Patient is instructed to call or message via MyChart if he/she has any questions or concerns regarding our treatment plan. No barriers to understanding were identified. We discussed Red Flag symptoms and signs in detail. Patient expressed understanding regarding what to do in case of urgent or emergency type symptoms.  Medication list was reconciled, printed and provided to the patient in AVS. Patient instructions and summary information was reviewed with the patient as documented in the AVS. This note was prepared with assistance of Dragon voice recognition software. Occasional wrong-word or sound-a-like substitutions may have occurred due to the inherent limitations of voice recognition  software  This visit occurred during the SARS-CoV-2 public health emergency.  Safety protocols were in place, including screening questions prior to the visit, additional usage of staff PPE, and extensive cleaning of exam room while observing appropriate contact time as indicated for disinfecting solutions.

## 2020-08-24 NOTE — Patient Instructions (Addendum)
Please return in 6 months for your annual complete physical; please come fasting.   Take the meloxicam daily for 1-2 weeks and use the muscle relaxer at night as needed.   If you have any questions or concerns, please don't hesitate to send me a message via MyChart or call the office at [phone]. Thank you for visiting with [place] today! It's our pleasure caring for you.

## 2021-01-14 ENCOUNTER — Other Ambulatory Visit: Payer: Self-pay | Admitting: Family Medicine

## 2021-02-18 ENCOUNTER — Other Ambulatory Visit: Payer: Self-pay | Admitting: Family Medicine

## 2021-02-18 DIAGNOSIS — Z1231 Encounter for screening mammogram for malignant neoplasm of breast: Secondary | ICD-10-CM

## 2021-02-24 ENCOUNTER — Encounter: Payer: Medicare PPO | Admitting: Family Medicine

## 2021-03-09 ENCOUNTER — Other Ambulatory Visit: Payer: Self-pay

## 2021-03-09 ENCOUNTER — Encounter: Payer: Self-pay | Admitting: Family Medicine

## 2021-03-09 ENCOUNTER — Telehealth (INDEPENDENT_AMBULATORY_CARE_PROVIDER_SITE_OTHER): Payer: Medicare PPO | Admitting: Family Medicine

## 2021-03-09 VITALS — BP 122/83 | Temp 98.1°F | Wt 146.0 lb

## 2021-03-09 DIAGNOSIS — U071 COVID-19: Secondary | ICD-10-CM

## 2021-03-09 DIAGNOSIS — E782 Mixed hyperlipidemia: Secondary | ICD-10-CM | POA: Diagnosis not present

## 2021-03-09 DIAGNOSIS — I10 Essential (primary) hypertension: Secondary | ICD-10-CM

## 2021-03-09 MED ORDER — AZELASTINE HCL 0.1 % NA SOLN: 2.0000 | Freq: Two times a day (BID) | NASAL | 12 refills | Status: DC

## 2021-03-09 MED ORDER — MOLNUPIRAVIR 200 MG PO CAPS: 4.0000 | ORAL_CAPSULE | Freq: Two times a day (BID) | ORAL | 0 refills | Status: AC

## 2021-03-09 NOTE — Progress Notes (Signed)
° °  [name] is a 76 y.o. female who presents today for a virtual office visit.  Assessment/Plan:  New/Acute Problems: COVID Discussed limitations of virtual visit and inability to perform physical exam.  Home COVID test was positive.  She appears well today without any signs of respiratory distress or other red flags.  She does have risk factors and she is within the treatment window for antivirals.  We will start molnupiravir. She is having some ear fullness and congestion as well.  We will start Astelin encouraged good hydration.  She can continue over-the-counter meds.  We discussed reasons to return to care.  Follow-up as needed.  Chronic Problems Addressed Today: Essential Hypertension Controlled on Metroprolol succinate 25 mg daily.  Increases risk for complication from COVID.  Dyslipidemia On Crestor 10 mg daily.  We will avoid paxlovid.    Subjective:  HPI:  Patient here with covid. Symptoms started 5 days ago and worsened for a couple of days afterwards. Her home test yesterday was positive. Symptoms include body aches, cough, ear fullness, headache. She was having subjective fevers for the last few days as well. Tried several OTC medications with modest improvement. Has a lot of pain in her left ear. This seems to be worsening.        Objective/Observations  Physical Exam: Gen: NAD, resting comfortably Pulm: Normal work of breathing Neuro: Grossly normal, moves all extremities Psych: Normal affect and thought content  Virtual Visit via Video   I connected with Nancyjo Givhan on 03/09/21 at  1:00 PM EST by a video enabled telemedicine application and verified that I am speaking with the correct person using two identifiers. The limitations of evaluation and management by telemedicine and the availability of in person appointments were discussed. The patient expressed understanding and agreed to proceed.   Patient location: Home Provider location: [place] [facility]  [facility] [employer] Persons participating in the virtual visit: Myself and discussed Patient     Katina Degree. Jimmey Ralph, MD 03/09/2021 12:27 PM

## 2021-03-16 ENCOUNTER — Encounter: Payer: Medicare PPO | Admitting: Family Medicine

## 2021-03-24 ENCOUNTER — Ambulatory Visit: Payer: Medicare PPO | Admitting: Physician Assistant

## 2021-03-24 ENCOUNTER — Encounter: Payer: Self-pay | Admitting: Physician Assistant

## 2021-03-24 ENCOUNTER — Other Ambulatory Visit: Payer: Self-pay

## 2021-03-24 VITALS — BP 133/81 | HR 66 | Temp 97.6°F | Ht 62.0 in | Wt 148.2 lb

## 2021-03-24 DIAGNOSIS — H6982 Other specified disorders of Eustachian tube, left ear: Secondary | ICD-10-CM

## 2021-03-24 DIAGNOSIS — H6992 Unspecified Eustachian tube disorder, left ear: Secondary | ICD-10-CM

## 2021-03-24 MED ORDER — METHYLPREDNISOLONE 4 MG PO TBPK: ORAL_TABLET | ORAL | 0 refills | Status: DC

## 2021-03-24 NOTE — Progress Notes (Signed)
Subjective:    Patient ID: [name], female    DOB: April 13, 1945, 76 y.o.   MRN: [id number]  Chief Complaint  Patient presents with   Nasal Congestion   Ear Fullness    Ear Fullness   Patient is in today for the following:  Chief complaint: Nasal congestion & L ear fullness  Symptom onset: 03-04-21 Pertinent positives: Dry cough Pertinent negatives: Body aches, N/V/D, headache  Treatments tried: Molnupiravir, Astelin nasal spray    Past Medical History:  Diagnosis Date   Arthritis    Chest pain at rest [id number]   Complication of anesthesia    Fracture of tibia and fibula 01/27/2015   GERD (gastroesophageal reflux disease)    Hashimoto's thyroiditis 11/17/2017   Lab Results Component Value Date  TSH 2.17 11/17/2017  FREET4 0.87 11/17/2017   Lab Results Component Value Date  TSH 2.17 11/17/2017  TSH 2.72 09/08/2017  TSH 5.20 (H) 11/21/2016  TSH 3.813 01/29/2015  FREET4 0.87 11/17/2017  FREET4 0.83 11/21/2016     High cholesterol    Hypertension    PONV (postoperative nausea and vomiting), Scopalamine controls    Seasonal allergies    Subclinical hypothyroidism 11/27/2016   Syndesmotic disruption of right ankle 03/06/2015   Vitamin D deficiency 03/06/2015    Past Surgical History:  Procedure Laterality Date   BUNIONECTOMY Bilateral    COLONOSCOPY     HARDWARE REMOVAL Right 12/10/2015   Procedure: HARDWARE REMOVAL RIGHT ANKLE;  Surgeon: Altamese Tilden, MD;  Location: [place];  Service: Orthopedics;  Laterality: Right;   MYOMECTOMY     ORIF TIBIA FRACTURE Right 01/29/2015   Procedure: OPEN REDUCTION INTERNAL FIXATION (ORIF) RIGHT TIBIA  AND FIBULA FRACTURES;  Surgeon: Altamese Templeton, MD;  Location: [place];  Service: Orthopedics;  Laterality: Right;    Family History  Problem Relation Age of Onset   CVA Mother    Heart disease Father    Diabetes Father    Heart attack Sister    CVA Sister    CVA Sister    CVA Sister     Social History   Tobacco Use   Smoking  status: Never   Smokeless tobacco: Never  Vaping Use   Vaping Use: Never used  Substance Use Topics   Alcohol use: Yes    Alcohol/week: 3.0 - 4.0 standard drinks    Types: 3 - 4 Glasses of wine per week   Drug use: No     No Known Allergies  Review of Systems NEGATIVE UNLESS OTHERWISE INDICATED IN HPI      Objective:     BP 133/81    Pulse 66    Temp 97.6 F (36.4 C)    Ht 5\' 2"  (1.575 m)    Wt 148 lb 3.2 oz (67.2 kg)    SpO2 97%    BMI 27.11 kg/m   Wt Readings from Last 3 Encounters:  03/24/21 148 lb 3.2 oz (67.2 kg)  03/09/21 146 lb (66.2 kg)  08/24/20 150 lb 12.8 oz (68.4 kg)    BP Readings from Last 3 Encounters:  03/24/21 133/81  03/09/21 122/83  08/24/20 126/82     Physical Exam Vitals and nursing note reviewed.  Constitutional:      Appearance: Normal appearance. She is normal weight. She is not toxic-appearing.  HENT:     Head: Normocephalic and atraumatic.     Right Ear: Tympanic membrane, ear canal and external ear normal.     Left Ear: Ear canal  and external ear normal. A middle ear effusion is present. Tympanic membrane is not injected, scarred, erythematous or bulging.     Nose: Nose normal.     Mouth/Throat:     Mouth: Mucous membranes are moist.  Eyes:     Extraocular Movements: Extraocular movements intact.     Conjunctiva/sclera: Conjunctivae normal.     Pupils: Pupils are equal, round, and reactive to light.  Cardiovascular:     Rate and Rhythm: Normal rate and regular rhythm.     Pulses: Normal pulses.     Heart sounds: Normal heart sounds.  Pulmonary:     Effort: Pulmonary effort is normal.     Breath sounds: Normal breath sounds.  Musculoskeletal:        General: Normal range of motion.     Cervical back: Normal range of motion and neck supple.  Skin:    General: Skin is warm and dry.  Neurological:     General: No focal deficit present.     Mental Status: She is alert and oriented to person, place, and time.  Psychiatric:         Mood and Affect: Mood normal.        Behavior: Behavior normal.        Thought Content: Thought content normal.        Judgment: Judgment normal.       Assessment & Plan:   Problem List Items Addressed This Visit   None Visit Diagnoses     Acute dysfunction of left eustachian tube    -  Primary        Meds ordered this encounter  Medications   methylPREDNISolone (MEDROL DOSEPAK) 4 MG TBPK tablet    Sig: Please take per packaging instructions.    Dispense:  21 tablet    Refill:  0    1. Acute dysfunction of left eustachian tube -usually doesn't require antibiotics -medrol dose pak, fluids, nasaal saline, daily Claritin or Zyrtec should help -Sudafed for no more than 2-3 days -pt to recheck prn    Jaritza Duignan M Gurfateh Mcclain, PA-C

## 2021-03-24 NOTE — Patient Instructions (Signed)
Post-viral acute ETD -usually doesn't require antibiotics -medrol dose pak, fluids, nasaal saline, daily Claritin or Zyrtec should help -Sudafed for no more than 2-3 days

## 2021-03-26 ENCOUNTER — Ambulatory Visit
Admission: RE | Admit: 2021-03-26 | Discharge: 2021-03-26 | Disposition: A | Discharge status: Home / Self Care | Payer: Medicare PPO | Source: Ambulatory Visit | Attending: Family Medicine | Admitting: Family Medicine

## 2021-03-26 ENCOUNTER — Other Ambulatory Visit: Payer: Self-pay

## 2021-03-26 DIAGNOSIS — Z1231 Encounter for screening mammogram for malignant neoplasm of breast: Secondary | ICD-10-CM

## 2021-04-16 ENCOUNTER — Encounter: Payer: Self-pay | Admitting: Family Medicine

## 2021-04-26 ENCOUNTER — Ambulatory Visit (INDEPENDENT_AMBULATORY_CARE_PROVIDER_SITE_OTHER): Payer: Medicare PPO | Admitting: Family Medicine

## 2021-04-26 ENCOUNTER — Encounter: Payer: Self-pay | Admitting: Family Medicine

## 2021-04-26 VITALS — BP 149/85 | HR 60 | Temp 97.4°F | Ht 62.0 in | Wt 150.2 lb

## 2021-04-26 DIAGNOSIS — E559 Vitamin D deficiency, unspecified: Secondary | ICD-10-CM

## 2021-04-26 DIAGNOSIS — I4719 Other supraventricular tachycardia: Secondary | ICD-10-CM

## 2021-04-26 DIAGNOSIS — Z0000 Encounter for general adult medical examination without abnormal findings: Secondary | ICD-10-CM | POA: Diagnosis not present

## 2021-04-26 DIAGNOSIS — D751 Secondary polycythemia: Secondary | ICD-10-CM | POA: Diagnosis not present

## 2021-04-26 DIAGNOSIS — I471 Supraventricular tachycardia: Secondary | ICD-10-CM | POA: Diagnosis not present

## 2021-04-26 DIAGNOSIS — E782 Mixed hyperlipidemia: Secondary | ICD-10-CM

## 2021-04-26 DIAGNOSIS — I10 Essential (primary) hypertension: Secondary | ICD-10-CM | POA: Diagnosis not present

## 2021-04-26 DIAGNOSIS — Z8639 Personal history of other endocrine, nutritional and metabolic disease: Secondary | ICD-10-CM | POA: Diagnosis not present

## 2021-04-26 DIAGNOSIS — Z1211 Encounter for screening for malignant neoplasm of colon: Secondary | ICD-10-CM | POA: Diagnosis not present

## 2021-04-26 DIAGNOSIS — R7301 Impaired fasting glucose: Secondary | ICD-10-CM

## 2021-04-26 LAB — COMPREHENSIVE METABOLIC PANEL
ALT: 36 U/L — ABNORMAL HIGH (ref 0–35)
AST: 31 U/L (ref 0–37)
Albumin: 4.4 g/dL (ref 3.5–5.2)
Alkaline Phosphatase: 49 U/L (ref 39–117)
BUN: 14 mg/dL (ref 6–23)
CO2: 29 mEq/L (ref 19–32)
Calcium: 9.7 mg/dL (ref 8.4–10.5)
Chloride: 101 mEq/L (ref 96–112)
Creatinine, Ser: 0.79 mg/dL (ref 0.40–1.20)
GFR: 73.98 mL/min (ref >60.00)
Glucose, Bld: 107 mg/dL — ABNORMAL HIGH (ref 70–99)
Potassium: 3.9 mEq/L (ref 3.5–5.1)
Sodium: 139 mEq/L (ref 135–145)
Total Bilirubin: 0.6 mg/dL (ref 0.2–1.2)
Total Protein: 6.7 g/dL (ref 6.0–8.3)

## 2021-04-26 LAB — CBC WITH DIFFERENTIAL/PLATELET
Basophils Absolute: 0 10*3/uL (ref 0.0–0.1)
Basophils Relative: 0.9 % (ref 0.0–3.0)
Eosinophils Absolute: 0.1 10*3/uL (ref 0.0–0.7)
Eosinophils Relative: 2 % (ref 0.0–5.0)
HCT: 44.4 % (ref 36.0–46.0)
Hemoglobin: 15.4 g/dL — ABNORMAL HIGH (ref 12.0–15.0)
Lymphocytes Relative: 30 % (ref 12.0–46.0)
Lymphs Abs: 1.3 10*3/uL (ref 0.7–4.0)
MCHC: 34.6 g/dL (ref 30.0–36.0)
MCV: 91.9 fl (ref 78.0–100.0)
Monocytes Absolute: 0.4 10*3/uL (ref 0.1–1.0)
Monocytes Relative: 8.5 % (ref 3.0–12.0)
Neutro Abs: 2.6 10*3/uL (ref 1.4–7.7)
Neutrophils Relative %: 58.6 % (ref 43.0–77.0)
Platelets: 235 10*3/uL (ref 150.0–400.0)
RBC: 4.84 Mil/uL (ref 3.87–5.11)
RDW: 14 % (ref 11.5–15.5)
WBC: 4.4 10*3/uL (ref 4.0–10.5)

## 2021-04-26 LAB — LIPID PANEL
Cholesterol: 190 mg/dL (ref 0–200)
HDL: 90.5 mg/dL (ref >39.00)
LDL Cholesterol: 83 mg/dL (ref 0–99)
NonHDL: 99.93
Total CHOL/HDL Ratio: 2
Triglycerides: 84 mg/dL (ref 0.0–149.0)
VLDL: 16.8 mg/dL (ref 0.0–40.0)

## 2021-04-26 LAB — HEMOGLOBIN A1C: Hgb A1c MFr Bld: 6.1 % (ref 4.6–6.5)

## 2021-04-26 LAB — TSH: TSH: 3.2 u[IU]/mL (ref 0.35–5.50)

## 2021-04-26 LAB — VITAMIN D 25 HYDROXY (VIT D DEFICIENCY, FRACTURES): VITD: 21.41 ng/mL — ABNORMAL LOW (ref 30.00–100.00)

## 2021-04-26 MED ORDER — ROSUVASTATIN CALCIUM 20 MG PO TABS: 20.0000 mg | ORAL_TABLET | Freq: Every day | ORAL | 3 refills | Status: DC

## 2021-04-26 MED ORDER — METOPROLOL SUCCINATE ER 25 MG PO TB24: 25.0000 mg | ORAL_TABLET | Freq: Every day | ORAL | 3 refills | Status: DC

## 2021-04-26 NOTE — Progress Notes (Signed)
Subjective  Chief Complaint  Patient presents with   Annual Exam    Pt is fasting. She has no concerns today. Cologuard mailed back today.   Hypertension   Hyperlipidemia   Gastroesophageal Reflux    HPI: [name] is a 76 y.o. female who presents to [facility] [place] today for a Female Wellness Visit. She also has the concerns and/or needs as listed above in the chief complaint. These will be addressed in addition to the Health Maintenance Visit.   Wellness Visit: annual visit with health maintenance review and exam without Pap  Health maintenance: Mammogram up-to-date.  Chart.  Please see colonoscopy from 2012 that was normal.  She then had a Cologuard in 2018 that was negative.  She reports that she just received her Cologuard and sent it in this morning.  She had no abnormal colonoscopies.  However, her nephew who was in his 63s was recently diagnosed with colon cancer.  She prefers to defer colonoscopy at this time.  Immunizations are up-to-date.  Had normal DEXA last year.  Walks daily for exercise.  Overall feels well.  Family stress persist but she says that overall she is coping well.  Occasional limp from sleeping. Chronic disease f/u and/or acute problem visit: (deemed necessary to be done in addition to the wellness visit): Hypertension: Mildly elevated today.  Not clear why.  Home readings are typically within the normal range.  On Toprol XL 25 daily and HCTZ 25 daily.  No chest pain.  He has occasional heart flutter with history of atrial tachycardia.  No chest pain or shortness of breath.  No edema.  Compliant with medications. Hyperlipidemia on Crestor 20 nightly.  No myalgias.  Fasting for recheck History of impaired fasting glucose without symptoms of hypoglycemia.  Follows a good diet.  Exercises daily. Monitoring thyroid.  No neck pain, clinical symptoms of hyperthyroidism or low thyroid.   Assessment  1. Annual physical exam   2. Essential  hypertension   3. Mixed hyperlipidemia   4. Ectopic atrial tachycardia (HCC)   5. IFG (impaired fasting glucose)   6. History of Hashimoto thyroiditis   7. Polycythemia   8. Vitamin D deficiency      Plan  Female Wellness Visit: Age appropriate Health Maintenance and Prevention measures were discussed with patient. Included topics are cancer screening recommendations, ways to keep healthy (see AVS) including dietary and exercise recommendations, regular eye and dental care, use of seat belts, and avoidance of moderate alcohol use and tobacco use.  Discussed colon cancer screening.  Patient elects Cologuard.  She sent that in today.  If negative, in 3 years can consider colonoscopy given family history.  Mammogram current BMI: discussed patient's BMI and encouraged positive lifestyle modifications to help get to or maintain a target BMI. HM needs and immunizations were addressed and ordered. See below for orders. See HM and immunization section for updates. Routine labs and screening tests ordered including cmp, cbc and lipids where appropriate. Discussed recommendations regarding Vit D and calcium supplementation (see AVS)  Chronic disease management visit and/or acute problem visit: Hypertension: Has been well controlled.  Mildly elevated today.  Patient was started home monitoring and send me blood pressure readings over the next week or 2.  Continue Toprol-XL 25 HCTZ 25 daily.  Check renal function electrolytes. Recheck sugars, thyroid and cholesterol levels.  Continue Crestor 20 Monitoring vitamin D and CBC.  He has been stable Discussed insomnia, monitoring mood.  Follow  up: Return in about 6 months (around 10/27/2021) for follow up Hypertension.  Orders Placed This Encounter  Procedures   CBC with Differential/Platelet   Comprehensive metabolic panel   Lipid panel   TSH   VITAMIN D 25 Hydroxy (Vit-D Deficiency, Fractures)   Hemoglobin A1c   Meds ordered this encounter   Medications   metoprolol succinate (TOPROL-XL) 25 MG 24 hr tablet    Sig: Take 1 tablet (25 mg total) by mouth daily.    Dispense:  90 tablet    Refill:  3   rosuvastatin (CRESTOR) 20 MG tablet    Sig: Take 1 tablet (20 mg total) by mouth daily.    Dispense:  90 tablet    Refill:  3      Body mass index is 27.47 kg/m. Wt Readings from Last 3 Encounters:  04/26/21 150 lb 3.2 oz (68.1 kg)  03/24/21 148 lb 3.2 oz (67.2 kg)  03/09/21 146 lb (66.2 kg)     Patient Active Problem List   Diagnosis Date Noted   Osteoporosis screening 03/11/2020    Normal Dexa 2017 and 2022    Polycythemia 02/25/2019    2016 noted, then improved until 2017: persistently elevated hgb since. Last 16.5; evaluated by hematology 2021: neg JAK2. High "Normal" hgb, no further eval or treatment needed.     IFG (impaired fasting glucose) 02/22/2019   Ectopic atrial tachycardia (HCC) 02/21/2019   History of Hashimoto thyroiditis 11/17/2017    Lab Results  Component Value Date   TSH 2.17 11/17/2017   FREET4 0.87 11/17/2017    Lab Results  Component Value Date   TSH 2.17 11/17/2017   TSH 2.72 09/08/2017   TSH 5.20 (H) 11/21/2016   TSH 3.813 01/29/2015   FREET4 0.87 11/17/2017   FREET4 0.83 11/21/2016       Seasonal allergies    GERD (gastroesophageal reflux disease)    Vitamin D deficiency 03/06/2015   Mixed hyperlipidemia    Essential hypertension 01/14/2013   Health Maintenance  Topic Date Due   Fecal DNA (Cologuard)  05/17/2019   MAMMOGRAM  03/26/2022   TETANUS/TDAP  11/21/2023   DEXA SCAN  03/09/2025   COLONOSCOPY (Pts [id number] Insurance coverage will need to be confirmed)  05/21/2026   Pneumonia Vaccine 5365+ Years old  Completed   INFLUENZA VACCINE  Completed   COVID-19 Vaccine  Completed   Hepatitis C Screening  Completed   Zoster Vaccines- Shingrix  Completed   HPV VACCINES  Aged Out   Immunization History  Administered Date(s) Administered   Fluad Quad(high Dose 65+)  11/19/2018, 11/27/2019, 11/19/2020   Influenza, High Dose Seasonal PF 11/21/2016, 11/17/2017   Influenza,inj,Quad PF,6+ Mos 01/05/2016   PFIZER Comirnaty(Gray Top)Covid-19 Tri-Sucrose Vaccine 09/06/2020   PFIZER(Purple Top)SARS-COV-2 Vaccination 03/13/2019, 04/03/2019, 12/19/2019   Pfizer Covid-19 Vaccine Bivalent Booster 6056yrs & up 01/21/2021   Pneumococcal Conjugate-13 12/26/2014   Pneumococcal Polysaccharide-23 12/28/2012   Tdap 11/20/2013   Zoster Recombinat (Shingrix) 12/21/2017, 02/26/2018   We updated and reviewed the patient's past history in detail and it is documented below. Allergies: Patient has No Known Allergies. Past Medical History Patient  has a past medical history of Arthritis, Chest pain at rest (01/14/2013), Complication of anesthesia, Fracture of tibia and fibula (01/27/2015), GERD (gastroesophageal reflux disease), Hashimoto's thyroiditis (11/17/2017), High cholesterol, Hypertension, PONV (postoperative nausea and vomiting), Scopalamine controls, Seasonal allergies, Subclinical hypothyroidism (11/27/2016), Syndesmotic disruption of right ankle (03/06/2015), and Vitamin D deficiency (03/06/2015). Past Surgical History Patient  has a past surgical  history that includes ORIF tibia fracture (Right, 01/29/2015); Bunionectomy (Bilateral); Colonoscopy; Hardware Removal (Right, 12/10/2015); and Myomectomy. Family History: Patient family history includes CVA in her mother, sister, sister, and sister; Diabetes in her father; Heart attack in her sister; Heart disease in her father. Social History:  Patient  reports that she has never smoked. She has never used smokeless tobacco. She reports current alcohol use of about 3.0 - 4.0 standard drinks per week. She reports that she does not use drugs.  Review of Systems: Constitutional: negative for fever or malaise Ophthalmic: negative for photophobia, double vision or loss of vision Cardiovascular: negative for chest pain, dyspnea on  exertion, or new LE swelling Respiratory: negative for SOB or persistent cough Gastrointestinal: negative for abdominal pain, change in bowel habits or melena Genitourinary: negative for dysuria or gross hematuria, no abnormal uterine bleeding or disharge Musculoskeletal: negative for new gait disturbance or muscular weakness Integumentary: negative for new or persistent rashes, no breast lumps Neurological: negative for TIA or stroke symptoms Psychiatric: negative for SI or delusions Allergic/Immunologic: negative for hives  Patient Care Team    Relationship Specialty Notifications Start End  Willow Ora, MD PCP - General Family Medicine  02/21/19   Carlus Pavlov, MD Consulting Physician Internal Medicine  08/15/18   Lyn Records, MD Consulting Physician Cardiology  08/15/18     Objective  Vitals: BP (!) 149/85    Pulse 60    Temp (!) 97.4 F (36.3 C) (Temporal)    Ht 5\' 2"  (1.575 m)    Wt 150 lb 3.2 oz (68.1 kg)    SpO2 97%    BMI 27.47 kg/m  General:  Well developed, well nourished, no acute distress  Psych:  Alert and orientedx3,normal mood and affect HEENT:  Normocephalic, atraumatic, non-icteric sclera,  supple neck without adenopathy, mass or thyromegaly Cardiovascular:  Normal S1, S2, RRR without gallop, rub or murmur Respiratory:  Good breath sounds bilaterally, CTAB with normal respiratory effort Gastrointestinal: normal bowel sounds, soft, non-tender, no noted masses. No HSM MSK: no deformities, contusions. Joints are without erythema or swelling.  Skin:  Warm, no rashes or suspicious lesions noted Neurologic:    Mental status is normal. CN 2-11 are normal. Gross motor and sensory exams are normal. Normal gait. No tremor   Commons side effects, risks, benefits, and alternatives for medications and treatment plan prescribed today were discussed, and the patient expressed understanding of the given instructions. Patient is instructed to call or message via MyChart if  he/she has any questions or concerns regarding our treatment plan. No barriers to understanding were identified. We discussed Red Flag symptoms and signs in detail. Patient expressed understanding regarding what to do in case of urgent or emergency type symptoms.  Medication list was reconciled, printed and provided to the patient in AVS. Patient instructions and summary information was reviewed with the patient as documented in the AVS. This note was prepared with assistance of Dragon voice recognition software. Occasional wrong-word or sound-a-like substitutions may have occurred due to the inherent limitations of voice recognition software  This visit occurred during the SARS-CoV-2 public health emergency.  Safety protocols were in place, including screening questions prior to the visit, additional usage of staff PPE, and extensive cleaning of exam room while observing appropriate contact time as indicated for disinfecting solutions.

## 2021-04-26 NOTE — Patient Instructions (Signed)
Please return in 6 months for hypertension follow up.  ? ?I will release your lab results to you on your MyChart account with further instructions. You may see the results before I do, but when I review them I will send you a message with my report or have my assistant call you if things need to be discussed. Please reply to my message with any questions. Thank you!  ? ?If you have any questions or concerns, please don't hesitate to send me a message via MyChart or call the office at [phone]. Thank you for visiting with us today! It's our pleasure caring for you.  ? ?Please do these things to maintain good health! ? ?Exercise at least 30-45 minutes a day,  4-5 days a week.  ?Eat a low-fat diet with lots of fruits and vegetables, up to 7-9 servings per day. ?Drink plenty of water daily. Try to drink 8 8oz glasses per day. ?Seatbelts can save your life. Always wear your seatbelt. ?Place Smoke Detectors on every level of your home and check batteries every year. ?Schedule an appointment with an eye doctor for an eye exam every 1-2 years ?Safe sex - use condoms to protect yourself from STDs if you could be exposed to these types of infections. Use birth control if you do not want to become pregnant and are sexually active. ?Avoid heavy alcohol use. If you drink, keep it to less than 2 drinks/day and not every day. ?Health Care Power of Attorney.  Choose someone you trust that could speak for you if you became unable to speak for yourself. ?Depression is common in our stressful world.If you're feeling down or losing interest in things you normally enjoy, please come in for a visit. ?If anyone is threatening or hurting you, please get help. Physical or Emotional Violence is never OK.   ?

## 2021-05-03 ENCOUNTER — Encounter: Payer: Self-pay | Admitting: Family Medicine

## 2021-05-03 LAB — COLOGUARD: COLOGUARD: NEGATIVE

## 2021-05-04 NOTE — Telephone Encounter (Signed)
See note

## 2021-06-23 ENCOUNTER — Encounter: Payer: Medicare PPO | Admitting: Family Medicine

## 2021-07-06 DIAGNOSIS — H2513 Age-related nuclear cataract, bilateral: Secondary | ICD-10-CM | POA: Diagnosis not present

## 2021-08-12 ENCOUNTER — Ambulatory Visit: Payer: Medicare PPO

## 2021-08-16 ENCOUNTER — Ambulatory Visit: Payer: Medicare PPO

## 2021-08-24 ENCOUNTER — Ambulatory Visit (INDEPENDENT_AMBULATORY_CARE_PROVIDER_SITE_OTHER): Payer: Medicare PPO

## 2021-08-24 DIAGNOSIS — Z0000 Encounter for general adult medical examination without abnormal findings: Secondary | ICD-10-CM | POA: Diagnosis not present

## 2021-08-24 NOTE — Progress Notes (Signed)
Virtual Visit via Telephone Note  I connected with  Alyssa HawMartha A Armond on 08/24/21 at 10:15 AM EDT by telephone and verified that I am speaking with the correct person using two identifiers.  Medicare Annual Wellness visit completed telephonically due to Covid-19 pandemic.   Persons participating in this call: This Health Coach and this patient.   Location: Patient: Home Provider: Office    I discussed the limitations, risks, security and privacy concerns of performing an evaluation and management service by telephone and the availability of in person appointments. The patient expressed understanding and agreed to proceed.  Unable to perform video visit due to video visit attempted and failed and/or patient does not have video capability.   Some vital signs may be absent or patient reported.   Marzella Schleinina H [place], LPN   Subjective:   [name] is a 76 y.o. female who presents for Medicare Annual (Subsequent) preventive examination.  Review of Systems     Cardiac Risk Factors include: advanced age (>2355men, 68>65 women);dyslipidemia;hypertension     Objective:    There were no vitals filed for this visit. There is no height or weight on file to calculate BMI.     08/24/2021   10:23 AM 08/06/2020    2:39 PM 04/12/2019    9:53 AM 02/28/2017    8:42 AM 12/10/2015    7:02 AM 01/27/2015    1:03 PM  Advanced Directives  Does Patient Have a Medical Advance Directive? Yes Yes Yes Yes Yes No  Type of Advance Directive Healthcare Power of Attorney  Living will;Healthcare Power of Attorney Living will;Healthcare Power of [employer] Corporation[employer]orney Healthcare Power of [place];Living will   Does patient want to make changes to medical advance directive?  Yes (MAU/Ambulatory/Procedural Areas - Information given) No - Patient declined No - Patient declined No - Patient declined   Copy of Healthcare Power of Attorney in Chart? No - copy requested  No - copy requested No - copy requested No - copy requested      Current Medications (verified) Outpatient Encounter Medications as of 08/24/2021  Medication Sig   aspirin 81 MG tablet Take 81 mg by mouth daily.   cholecalciferol (VITAMIN D3) 25 MCG (1000 UT) tablet Take by mouth daily.   Coenzyme Q10 (COQ10) 100 MG CAPS Take 100 mg by mouth daily.   hydrochlorothiazide (HYDRODIURIL) 25 MG tablet TAKE 1 TABLET EVERY DAY   Ketotifen Fumarate (ALLERGY EYE DROPS OP) Apply 1 drop to eye daily as needed (allergies).   meloxicam (MOBIC) 7.5 MG tablet Take 1 tablet (7.5 mg total) by mouth daily as needed for pain.   metoprolol succinate (TOPROL-XL) 25 MG 24 hr tablet Take 1 tablet (25 mg total) by mouth daily.   rosuvastatin (CRESTOR) 20 MG tablet Take 1 tablet (20 mg total) by mouth daily.   azelastine (ASTELIN) 0.1 % nasal spray Place 2 sprays into both nostrils 2 (two) times daily. (Patient not taking: Reported on 03/24/2021)   fluticasone (FLONASE) 50 MCG/ACT nasal spray Place 2 sprays into both nostrils as needed for allergies or rhinitis. (Patient not taking: Reported on 04/26/2021)   Multiple Vitamin (MULTIVITAMIN) tablet Take 1 tablet by mouth daily. (Patient not taking: Reported on 03/09/2021)   No facility-administered encounter medications on file as of 08/24/2021.    Allergies (verified) Patient has no known allergies.   History: Past Medical History:  Diagnosis Date   Arthritis    Chest pain at rest 01/14/2013   Complication of anesthesia  Fracture of tibia and fibula 01/27/2015   GERD (gastroesophageal reflux disease)    Hashimoto's thyroiditis 11/17/2017   Lab Results Component Value Date  TSH 2.17 11/17/2017  FREET4 0.87 11/17/2017   Lab Results Component Value Date  TSH 2.17 11/17/2017  TSH 2.72 09/08/2017  TSH 5.20 (H) 11/21/2016  TSH 3.813 01/29/2015  FREET4 0.87 11/17/2017  FREET4 0.83 11/21/2016     High cholesterol    Hypertension    PONV (postoperative nausea and vomiting), Scopalamine controls    Seasonal allergies     Subclinical hypothyroidism 11/27/2016   Syndesmotic disruption of right ankle 03/06/2015   Vitamin D deficiency 03/06/2015   Past Surgical History:  Procedure Laterality Date   BUNIONECTOMY Bilateral    COLONOSCOPY     HARDWARE REMOVAL Right 12/10/2015   Procedure: HARDWARE REMOVAL RIGHT ANKLE;  Surgeon: Myrene Galas, MD;  Location: [facility] OR;  Service: Orthopedics;  Laterality: Right;   MYOMECTOMY     ORIF TIBIA FRACTURE Right 01/29/2015   Procedure: OPEN REDUCTION INTERNAL FIXATION (ORIF) RIGHT TIBIA  AND FIBULA FRACTURES;  Surgeon: Myrene Galas, MD;  Location: [facility] OR;  Service: Orthopedics;  Laterality: Right;   Family History  Problem Relation Age of Onset   CVA Mother    Heart disease Father    Diabetes Father    Heart attack Sister    CVA Sister    CVA Sister    CVA Sister    Social History   Socioeconomic History   Marital status: Married    Spouse name: Not on file   Number of children: Not on file   Years of education: Not on file   Highest education level: Not on file  Occupational History   Occupation: Retired Runner, broadcasting/film/video  Tobacco Use   Smoking status: Never   Smokeless tobacco: Never  Vaping Use   Vaping Use: Never used  Substance and Sexual Activity   Alcohol use: Yes    Alcohol/week: 3.0 - 4.0 standard drinks of alcohol    Types: 3 - 4 Glasses of wine per week   Drug use: No   Sexual activity: Yes    Birth control/protection: Post-menopausal  Other Topics Concern   Not on file  Social History Narrative   Not on file   Social Determinants of Health   Financial Resource Strain: Low Risk  (08/24/2021)   Overall Financial Resource Strain (CARDIA)    Difficulty of Paying Living Expenses: Not hard at all  Food Insecurity: No Food Insecurity (08/24/2021)   Hunger Vital Sign    Worried About Running Out of Food in the Last Year: Never true    Ran Out of Food in the Last Year: Never true  Transportation Needs: No Transportation Needs (08/24/2021)   PRAPARE -  Administrator, Civil Service (Medical): No    Lack of Transportation (Non-Medical): No  Physical Activity: Sufficiently Active (08/24/2021)   Exercise Vital Sign    Days of Exercise per Week: 5 days    Minutes of Exercise per Session: 90 min  Stress: No Stress Concern Present (08/24/2021)   [employer] of Occupational Health - Occupational Stress Questionnaire    Feeling of Stress : Not at all  Social Connections: Moderately Isolated (08/24/2021)   Social Connection and Isolation Panel [NHANES]    Frequency of Communication with Friends and Family: More than three times a week    Frequency of Social Gatherings with Friends and Family: More than three times a week  Attends Religious Services: Never    Active Member of Clubs or Organizations: No    Attends Banker Meetings: Never    Marital Status: Married    Tobacco Counseling Counseling given: Not Answered   Clinical Intake:  Pre-visit preparation completed: Yes  Pain : No/denies pain     BMI - recorded: 27.47 Nutritional Status: BMI 25 -29 Overweight Nutritional Risks: None Diabetes: No  How often do you need to have someone help you when you read instructions, pamphlets, or other written materials from your doctor or pharmacy?: 1 - Never  Diabetic?no  Interpreter Needed?: No  Information entered by :: Lanier Ensign, LPN   Activities of Daily Living    08/24/2021   10:24 AM  In your present state of health, do you have any difficulty performing the following activities:  Hearing? 1  Comment slight loss  Vision? 0  Difficulty concentrating or making decisions? 0  Walking or climbing stairs? 0  Dressing or bathing? 0  Doing errands, shopping? 0  Preparing Food and eating ? N  Using the Toilet? N  In the past six months, have you accidently leaked urine? N  Do you have problems with loss of bowel control? N  Managing your Medications? N  Managing your Finances? N   Housekeeping or managing your Housekeeping? N    Patient Care Team: Willow Ora, MD as PCP - General (Family Medicine) Carlus Pavlov, MD as Consulting Physician (Internal Medicine) Lyn Records, MD as Consulting Physician (Cardiology)  Indicate any recent Medical Services you may have received from other than [facility] providers in the past year (date may be approximate).     Assessment:   This is a routine wellness examination for Rina.  Hearing/Vision screen Hearing Screening - Comments:: Pt stated slight loss  Vision Screening - Comments:: Pt follows up with [facility]   Dietary issues and exercise activities discussed: Current Exercise Habits: Home exercise routine;Structured exercise class, Type of exercise: walking;Other - see comments (water aerobics), Time (Minutes): > 60, Frequency (Times/Week): 5, Weekly Exercise (Minutes/Week): 0   Goals Addressed             This Visit's Progress    Patient Stated       Continue exercise and lose weight        Depression Screen    08/24/2021   10:21 AM 04/26/2021    9:05 AM 08/24/2020    8:30 AM 08/06/2020    2:37 PM 02/24/2020    9:27 AM 04/12/2019    9:54 AM 08/15/2018   10:34 AM  PHQ 2/9 Scores  PHQ - 2 Score 0 0 0 0 0 0 0  PHQ- 9 Score       1    Fall Risk    08/24/2021   10:24 AM 04/26/2021    9:05 AM 08/06/2020    2:42 PM 02/24/2020    9:27 AM 04/12/2019    9:54 AM  Fall Risk   Falls in the past year? 0 0 0 0 0  Number falls in past yr: 0 0 0 0 0  Injury with Fall? 0 0 0 0 0  Risk for fall due to : Impaired vision;Impaired balance/gait No Fall Risks Impaired vision    Risk for fall due to: Comment related to fluid in ear at times      Follow up Falls prevention discussed Falls evaluation completed Falls prevention discussed  Falls evaluation completed;Education provided;Falls prevention discussed  FALL RISK PREVENTION PERTAINING TO THE HOME:  Any stairs in or around the home? Yes  If so,  are there any without handrails? No  Home free of loose throw rugs in walkways, pet beds, electrical cords, etc? Yes  Adequate lighting in your home to reduce risk of falls? Yes   ASSISTIVE DEVICES UTILIZED TO PREVENT FALLS:  Life alert? No  Use of a cane, walker or w/c? No  Grab bars in the bathroom? Yes  Shower chair or bench in shower? Yes  Elevated toilet seat or a handicapped toilet? No   TIMED UP AND GO:  Was the test performed? No .   Cognitive Function:        08/24/2021   10:26 AM 08/06/2020    2:45 PM 04/12/2019    9:54 AM  6CIT Screen  What Year? 0 points 0 points 0 points  What month? 0 points 0 points 0 points  What time? 0 points 0 points 0 points  Count back from 20 0 points 0 points 0 points  Months in reverse 0 points 0 points 0 points  Repeat phrase 0 points 0 points 0 points  Total Score 0 points 0 points 0 points    Immunizations Immunization History  Administered Date(s) Administered   Fluad Quad(high Dose 65+) 11/19/2018, 11/27/2019, 11/19/2020   Influenza, High Dose Seasonal PF 11/21/2016, 11/17/2017   Influenza,inj,Quad PF,6+ Mos 01/05/2016   PFIZER Comirnaty(Gray Top)Covid-19 Tri-Sucrose Vaccine 09/06/2020   PFIZER(Purple Top)SARS-COV-2 Vaccination 03/13/2019, 04/03/2019, 12/19/2019   Pfizer Covid-19 Vaccine Bivalent Booster 84yrs & up 01/21/2021   Pneumococcal Conjugate-13 12/26/2014   Pneumococcal Polysaccharide-23 12/28/2012   Tdap 11/20/2013   Zoster Recombinat (Shingrix) 12/21/2017, 02/26/2018    TDAP status: Up to date  Flu Vaccine status: Up to date  Pneumococcal vaccine status: Up to date  Covid-19 vaccine status: Completed vaccines  Qualifies for Shingles Vaccine? Yes   Zostavax completed Yes   Shingrix Completed?: Yes  Screening Tests Health Maintenance  Topic Date Due   COVID-19 Vaccine (6 - Pfizer series) 05/22/2021   INFLUENZA VACCINE  09/14/2021   MAMMOGRAM  03/26/2022   TETANUS/TDAP  11/21/2023   Fecal DNA  (Cologuard)  04/26/2024   DEXA SCAN  03/09/2025   COLONOSCOPY (Pts 45-57yrs Insurance coverage will need to be confirmed)  05/21/2026   Pneumonia Vaccine 24+ Years old  Completed   Hepatitis C Screening  Completed   Zoster Vaccines- Shingrix  Completed   HPV VACCINES  Aged Out    Health Maintenance  Health Maintenance Due  Topic Date Due   COVID-19 Vaccine (6 - Pfizer series) 05/22/2021     Colorectal cancer screening: Type of screening: Cologuard. Completed 04/26/21. Repeat every 3 years  Mammogram status: Completed 03/26/21. Repeat every year  Bone Density status: Completed 03/09/20. Results reflect: Bone density results: NORMAL. Repeat every 5 years.   Additional Screening:  Hepatitis C Screening:  Completed 11/21/16  Vision Screening: Recommended annual ophthalmology exams for early detection of glaucoma and other disorders of the eye. Is the patient up to date with their annual eye exam?  Yes  Who is the provider or what is the name of the office in which the patient attends annual eye exams? [facility]  If pt is not established with a provider, would they like to be referred to a provider to establish care? No .   Dental Screening: Recommended annual dental exams for proper oral hygiene  Community Resource Referral / Chronic Care Management: CRR required this  visit?  No   CCM required this visit?  No      Plan:     I have personally reviewed and noted the following in the patient's chart:   Medical and social history Use of alcohol, tobacco or illicit drugs  Current medications and supplements including opioid prescriptions.  Functional ability and status Nutritional status Physical activity Advanced directives List of other physicians Hospitalizations, surgeries, and ER visits in previous 12 months Vitals Screenings to include cognitive, depression, and falls Referrals and appointments  In addition, I have reviewed and discussed with patient certain  preventive protocols, quality metrics, and best practice recommendations. A written personalized care plan for preventive services as well as general preventive health recommendations were provided to patient.     Marzella Schlein, LPN   [id number]/61/0960   Nurse Notes: None

## 2021-08-24 NOTE — Patient Instructions (Signed)
[name] , Thank you for taking time to come for your Medicare Wellness Visit. I appreciate your ongoing commitment to your health goals. Please review the following plan we discussed and let me know if I can assist you in the future.   Screening recommendations/referrals: Colonoscopy: Done 04/26/21 repeat every 3 years  Mammogram: Done 03/26/21 repeat every year  Bone Density: Done 03/09/20 repeat every 5 years  Recommended yearly ophthalmology/optometry visit for glaucoma screening and checkup Recommended yearly dental visit for hygiene and checkup  Vaccinations: Influenza vaccine: done 11/19/20 repeat every year  Pneumococcal vaccine: Up to date Tdap vaccine: Done 11/20/13 repeat every 10 years  Shingles vaccine: completed 11/719, 02/26/18   Covid-19:Done 1/27, 2/17, 12/19/19 & 7/24, 01/21/21  Advanced directives: Please bring a copy of your health care power of attorney and living will to the office at your convenience.  Conditions/risks identified: continue exercise and lose weight   Next appointment: Follow up in one year for your annual wellness visit    Preventive Care 65 Years and Older, Female Preventive care refers to lifestyle choices and visits with your health care provider that can promote health and wellness. What does preventive care include? A yearly physical exam. This is also called an annual well check. Dental exams once or twice a year. Routine eye exams. Ask your health care provider how often you should have your eyes checked. Personal lifestyle choices, including: Daily care of your teeth and gums. Regular physical activity. Eating a healthy diet. Avoiding tobacco and drug use. Limiting alcohol use. Practicing safe sex. Taking low-dose aspirin every day. Taking vitamin and mineral supplements as recommended by your health care provider. What happens during an annual well check? The services and screenings done by your health care provider during your annual  well check will depend on your age, overall health, lifestyle risk factors, and family history of disease. Counseling  Your health care provider may ask you questions about your: Alcohol use. Tobacco use. Drug use. Emotional well-being. Home and relationship well-being. Sexual activity. Eating habits. History of falls. Memory and ability to understand (cognition). Work and work Astronomer. Reproductive health. Screening  You may have the following tests or measurements: Height, weight, and BMI. Blood pressure. Lipid and cholesterol levels. These may be checked every 5 years, or more frequently if you are over 72 years old. Skin check. Lung cancer screening. You may have this screening every year starting at age 36 if you have a 30-pack-year history of smoking and currently smoke or have quit within the past 15 years. Fecal occult blood test (FOBT) of the stool. You may have this test every year starting at age 28. Flexible sigmoidoscopy or colonoscopy. You may have a sigmoidoscopy every 5 years or a colonoscopy every 10 years starting at age 40. Hepatitis C blood test. Hepatitis B blood test. Sexually transmitted disease (STD) testing. Diabetes screening. This is done by checking your blood sugar (glucose) after you have not eaten for a while (fasting). You may have this done every 1-3 years. Bone density scan. This is done to screen for osteoporosis. You may have this done starting at age 5. Mammogram. This may be done every 1-2 years. Talk to your health care provider about how often you should have regular mammograms. Talk with your health care provider about your test results, treatment options, and if necessary, the need for more tests. Vaccines  Your health care provider may recommend certain vaccines, such as: Influenza vaccine. This is recommended every  year. Tetanus, diphtheria, and acellular pertussis (Tdap, Td) vaccine. You may need a Td booster every 10 years. Zoster  vaccine. You may need this after age 110. Pneumococcal 13-valent conjugate (PCV13) vaccine. One dose is recommended after age 72. Pneumococcal polysaccharide (PPSV23) vaccine. One dose is recommended after age 52. Talk to your health care provider about which screenings and vaccines you need and how often you need them. This information is not intended to replace advice given to you by your health care provider. Make sure you discuss any questions you have with your health care provider. Document Released: 02/27/2015 Document Revised: 10/21/2015 Document Reviewed: 12/02/2014 Elsevier Interactive Patient Education  2017 [employer] Prevention in the Home Falls can cause injuries. They can happen to people of all ages. There are many things you can do to make your home safe and to help prevent falls. What can I do on the outside of my home? Regularly fix the edges of walkways and driveways and fix any cracks. Remove anything that might make you trip as you walk through a door, such as a raised step or threshold. Trim any bushes or trees on the path to your home. Use bright outdoor lighting. Clear any walking paths of anything that might make someone trip, such as rocks or tools. Regularly check to see if handrails are loose or broken. Make sure that both sides of any steps have handrails. Any raised decks and porches should have guardrails on the edges. Have any leaves, snow, or ice cleared regularly. Use sand or salt on walking paths during winter. Clean up any spills in your garage right away. This includes oil or grease spills. What can I do in the bathroom? Use night lights. Install grab bars by the toilet and in the tub and shower. Do not use towel bars as grab bars. Use non-skid mats or decals in the tub or shower. If you need to sit down in the shower, use a plastic, non-slip stool. Keep the floor dry. Clean up any water that spills on the floor as soon as it happens. Remove  soap buildup in the tub or shower regularly. Attach bath mats securely with double-sided non-slip rug tape. Do not have throw rugs and other things on the floor that can make you trip. What can I do in the bedroom? Use night lights. Make sure that you have a light by your bed that is easy to reach. Do not use any sheets or blankets that are too big for your bed. They should not hang down onto the floor. Have a firm chair that has side arms. You can use this for support while you get dressed. Do not have throw rugs and other things on the floor that can make you trip. What can I do in the kitchen? Clean up any spills right away. Avoid walking on wet floors. Keep items that you use a lot in easy-to-reach places. If you need to reach something above you, use a strong step stool that has a grab bar. Keep electrical cords out of the way. Do not use floor polish or wax that makes floors slippery. If you must use wax, use non-skid floor wax. Do not have throw rugs and other things on the floor that can make you trip. What can I do with my stairs? Do not leave any items on the stairs. Make sure that there are handrails on both sides of the stairs and use them. Fix handrails that are broken or  loose. Make sure that handrails are as long as the stairways. Check any carpeting to make sure that it is firmly attached to the stairs. Fix any carpet that is loose or worn. Avoid having throw rugs at the top or bottom of the stairs. If you do have throw rugs, attach them to the floor with carpet tape. Make sure that you have a light switch at the top of the stairs and the bottom of the stairs. If you do not have them, ask someone to add them for you. What else can I do to help prevent falls? Wear shoes that: Do not have high heels. Have rubber bottoms. Are comfortable and fit you well. Are closed at the toe. Do not wear sandals. If you use a stepladder: Make sure that it is fully opened. Do not climb a  closed stepladder. Make sure that both sides of the stepladder are locked into place. Ask someone to hold it for you, if possible. Clearly mark and make sure that you can see: Any grab bars or handrails. First and last steps. Where the edge of each step is. Use tools that help you move around (mobility aids) if they are needed. These include: Canes. Walkers. Scooters. Crutches. Turn on the lights when you go into a dark area. Replace any light bulbs as soon as they burn out. Set up your furniture so you have a clear path. Avoid moving your furniture around. If any of your floors are uneven, fix them. If there are any pets around you, be aware of where they are. Review your medicines with your doctor. Some medicines can make you feel dizzy. This can increase your chance of falling. Ask your doctor what other things that you can do to help prevent falls. This information is not intended to replace advice given to you by your health care provider. Make sure you discuss any questions you have with your health care provider. Document Released: 11/27/2008 Document Revised: 07/09/2015 Document Reviewed: 03/07/2014 Elsevier Interactive Patient Education  2017 [employer].

## 2021-10-27 ENCOUNTER — Ambulatory Visit: Payer: Medicare PPO | Admitting: Family Medicine

## 2021-10-27 ENCOUNTER — Encounter: Payer: Self-pay | Admitting: Family Medicine

## 2021-10-27 VITALS — BP 120/78 | HR 62 | Temp 97.7°F | Ht 62.0 in | Wt 145.0 lb

## 2021-10-27 DIAGNOSIS — I10 Essential (primary) hypertension: Secondary | ICD-10-CM | POA: Diagnosis not present

## 2021-10-27 DIAGNOSIS — Z1212 Encounter for screening for malignant neoplasm of rectum: Secondary | ICD-10-CM

## 2021-10-27 DIAGNOSIS — Z1211 Encounter for screening for malignant neoplasm of colon: Secondary | ICD-10-CM

## 2021-10-27 DIAGNOSIS — R7301 Impaired fasting glucose: Secondary | ICD-10-CM | POA: Diagnosis not present

## 2021-10-27 DIAGNOSIS — Z23 Encounter for immunization: Secondary | ICD-10-CM

## 2021-10-27 NOTE — Progress Notes (Signed)
Subjective  CC:  Chief Complaint  Patient presents with   Hypertension    Pt here for 41mos F/U on BP    HPI: [name] is a 76 y.o. female who presents to the office today to address the problems listed above in the chief complaint. Hypertension f/u: Control is good . Pt reports she is doing well. taking medications as instructed, no medication side effects noted, no TIAs, no chest pain on exertion, no dyspnea on exertion, no swelling of ankles. She denies adverse effects from his BP medications. Compliance with medication is good.  IFG with last a1c 6.0 in march; now 3x/week deep water aerobic class and losing weight and eating better. No sxs of hyperglycemia Reviewed recent neg cologuard for crc screen and edcuated.  HD flu shot today  Assessment  1. Essential hypertension   2. Screening for colorectal cancer   3. IFG (impaired fasting glucose)   4. Need for immunization against influenza      Plan   Hypertension f/u: BP control is well controlled. Continue low dose metoprolol and hctz.  IFG: should be improving with improved diet and exercise and weight loss. Praised.  Neg cologuard; repeat in 3 years.  Flu shot given today.   Education regarding management of these chronic disease states was given. Management strategies discussed on successive visits include dietary and exercise recommendations, goals of achieving and maintaining IBW, and lifestyle modifications aiming for adequate sleep and minimizing stressors.   Follow up: Return in about 6 months (around 04/27/2022) for complete physical.  Orders Placed This Encounter  Procedures   Flu Vaccine QUAD High Dose(Fluad)   No orders of the defined types were placed in this encounter.     BP Readings from Last 3 Encounters:  10/27/21 120/78  04/26/21 (!) 149/85  03/24/21 133/81   Wt Readings from Last 3 Encounters:  10/27/21 145 lb (65.8 kg)  04/26/21 150 lb 3.2 oz (68.1 kg)  03/24/21 148 lb 3.2 oz (67.2 kg)     Lab Results  Component Value Date   CHOL 190 04/26/2021   CHOL 185 02/24/2020   CHOL 203 (H) 05/22/2019   Lab Results  Component Value Date   HDL 90.50 04/26/2021   HDL 88.00 02/24/2020   HDL 93.50 05/22/2019   Lab Results  Component Value Date   LDLCALC 83 04/26/2021   LDLCALC 84 02/24/2020   LDLCALC 97 05/22/2019   Lab Results  Component Value Date   TRIG 84.0 04/26/2021   TRIG 65.0 02/24/2020   TRIG 63.0 05/22/2019   Lab Results  Component Value Date   CHOLHDL 2 04/26/2021   CHOLHDL 2 02/24/2020   CHOLHDL 2 05/22/2019   No results found for: "LDLDIRECT" Lab Results  Component Value Date   CREATININE 0.79 04/26/2021   BUN 14 04/26/2021   NA 139 04/26/2021   K 3.9 04/26/2021   CL 101 04/26/2021   CO2 29 04/26/2021    The 10-year ASCVD risk score (Arnett DK, et al., 2019) is: 16.6%   Values used to calculate the score:     Age: 49 years     Sex: Female     Is Non-Hispanic African American: No     Diabetic: No     Tobacco smoker: No     Systolic Blood Pressure: 120 mmHg     Is BP treated: Yes     HDL Cholesterol: 90.5 mg/dL     Total Cholesterol: 190 mg/dL  I reviewed the  patients updated PMH, FH, and SocHx.    Patient Active Problem List   Diagnosis Date Noted   Screening for colorectal cancer 10/27/2021   Osteoporosis screening 03/11/2020   Polycythemia 02/25/2019   IFG (impaired fasting glucose) 02/22/2019   Ectopic atrial tachycardia (HCC) 02/21/2019   History of Hashimoto thyroiditis 11/17/2017   Seasonal allergies    GERD (gastroesophageal reflux disease)    Vitamin D deficiency 03/06/2015   Mixed hyperlipidemia    Essential hypertension 01/14/2013    Allergies: Patient has no known allergies.  Social History: Patient  reports that she has never smoked. She has never used smokeless tobacco. She reports current alcohol use of about 3.0 - 4.0 standard drinks of alcohol per week. She reports that she does not use drugs.  Current Meds   Medication Sig   aspirin 81 MG tablet Take 81 mg by mouth daily.   cholecalciferol (VITAMIN D3) 25 MCG (1000 UT) tablet Take by mouth daily.   Coenzyme Q10 (COQ10) 100 MG CAPS Take 100 mg by mouth daily.   hydrochlorothiazide (HYDRODIURIL) 25 MG tablet TAKE 1 TABLET EVERY DAY   metoprolol succinate (TOPROL-XL) 25 MG 24 hr tablet Take 1 tablet (25 mg total) by mouth daily.   Multiple Vitamin (MULTIVITAMIN) tablet Take 1 tablet by mouth daily.   rosuvastatin (CRESTOR) 20 MG tablet Take 1 tablet (20 mg total) by mouth daily.   [DISCONTINUED] meloxicam (MOBIC) 7.5 MG tablet Take 1 tablet (7.5 mg total) by mouth daily as needed for pain.    Review of Systems: Cardiovascular: negative for chest pain, palpitations, leg swelling, orthopnea Respiratory: negative for SOB, wheezing or persistent cough Gastrointestinal: negative for abdominal pain Genitourinary: negative for dysuria or gross hematuria  Objective  Vitals: BP 120/78   Pulse 62   Temp 97.7 F (36.5 C)   Ht 5\' 2"  (1.575 m)   Wt 145 lb (65.8 kg)   SpO2 97%   BMI 26.52 kg/m  General: no acute distress  Psych:  Alert and oriented, normal mood and affect HEENT:  Normocephalic, atraumatic, supple neck  Cardiovascular:  RRR without murmur. no edema Respiratory:  Good breath sounds bilaterally, CTAB with normal respiratory effort Skin:  Warm, no rashes Neurologic:   Mental status is normal Commons side effects, risks, benefits, and alternatives for medications and treatment plan prescribed today were discussed, and the patient expressed understanding of the given instructions. Patient is instructed to call or message via MyChart if he/she has any questions or concerns regarding our treatment plan. No barriers to understanding were identified. We discussed Red Flag symptoms and signs in detail. Patient expressed understanding regarding what to do in case of urgent or emergency type symptoms.  Medication list was reconciled, printed and  provided to the patient in AVS. Patient instructions and summary information was reviewed with the patient as documented in the AVS. This note was prepared with assistance of Dragon voice recognition software. Occasional wrong-word or sound-a-like substitutions may have occurred due to the inherent limitations of voice recognition software  This visit occurred during the SARS-CoV-2 public health emergency.  Safety protocols were in place, including screening questions prior to the visit, additional usage of staff PPE, and extensive cleaning of exam room while observing appropriate contact time as indicated for disinfecting solutions.

## 2022-01-23 ENCOUNTER — Other Ambulatory Visit: Payer: Self-pay | Admitting: Family Medicine

## 2022-02-15 ENCOUNTER — Other Ambulatory Visit: Payer: Self-pay | Admitting: Family Medicine

## 2022-02-15 DIAGNOSIS — Z1231 Encounter for screening mammogram for malignant neoplasm of breast: Secondary | ICD-10-CM

## 2022-03-07 ENCOUNTER — Other Ambulatory Visit: Payer: Self-pay | Admitting: Family Medicine

## 2022-04-05 ENCOUNTER — Ambulatory Visit
Admission: RE | Admit: 2022-04-05 | Discharge: 2022-04-05 | Disposition: A | Discharge status: Home / Self Care | Payer: Medicare PPO | Source: Ambulatory Visit

## 2022-04-05 DIAGNOSIS — Z1231 Encounter for screening mammogram for malignant neoplasm of breast: Secondary | ICD-10-CM

## 2022-06-02 ENCOUNTER — Other Ambulatory Visit: Payer: Self-pay | Admitting: Family Medicine

## 2022-07-28 DIAGNOSIS — H2513 Age-related nuclear cataract, bilateral: Secondary | ICD-10-CM | POA: Diagnosis not present

## 2022-09-01 ENCOUNTER — Ambulatory Visit: Payer: Medicare PPO

## 2022-09-01 VITALS — BP 124/82 | HR 70 | Temp 97.3°F | Wt 146.8 lb

## 2022-09-01 DIAGNOSIS — Z0000 Encounter for general adult medical examination without abnormal findings: Secondary | ICD-10-CM

## 2022-09-01 NOTE — Progress Notes (Signed)
Subjective:   [name] is a 77 y.o. female who presents for Medicare Annual (Subsequent) preventive examination.  Visit Complete: In person   Review of Systems     Cardiac Risk Factors include: advanced age (>65men, >75 women);hypertension;dyslipidemia     Objective:    Today's Vitals   09/01/22 0937  BP: 124/82  Pulse: 70  Temp: (!) 97.3 F (36.3 C)  SpO2: 95%  Weight: 146 lb 12.8 oz (66.6 kg)   Body mass index is 26.85 kg/m.     09/01/2022    9:47 AM 08/24/2021   10:23 AM 08/06/2020    2:39 PM 04/12/2019    9:53 AM 02/28/2017    8:42 AM 12/10/2015    7:02 AM 01/27/2015    1:03 PM  Advanced Directives  Does Patient Have a Medical Advance Directive? Yes Yes Yes Yes Yes Yes No  Type of Estate agent of [place];Living will Healthcare Power of Attorney  Living will;Healthcare Power of Attorney Living will;Healthcare Power of [employer] Power of [place];Living will   Does patient want to make changes to medical advance directive?   Yes (MAU/Ambulatory/Procedural Areas - Information given) No - Patient declined No - Patient declined No - Patient declined   Copy of Healthcare Power of Attorney in Chart? No - copy requested No - copy requested  No - copy requested No - copy requested No - copy requested     Current Medications (verified) Outpatient Encounter Medications as of 09/01/2022  Medication Sig   aspirin 81 MG tablet Take 81 mg by mouth daily.   cholecalciferol (VITAMIN D3) 25 MCG (1000 UT) tablet Take by mouth daily.   Coenzyme Q10 (COQ10) 100 MG CAPS Take 100 mg by mouth daily.   fluticasone (FLONASE) 50 MCG/ACT nasal spray Place 2 sprays into both nostrils as needed for allergies or rhinitis.   hydrochlorothiazide (HYDRODIURIL) 25 MG tablet TAKE 1 TABLET EVERY DAY   metoprolol succinate (TOPROL-XL) 25 MG 24 hr tablet TAKE 1 TABLET EVERY DAY   Multiple Vitamin (MULTIVITAMIN) tablet Take 1 tablet by mouth daily.   rosuvastatin  (CRESTOR) 20 MG tablet TAKE 1 TABLET EVERY DAY   No facility-administered encounter medications on file as of 09/01/2022.    Allergies (verified) Patient has no known allergies.   History: Past Medical History:  Diagnosis Date   Arthritis    Chest pain at rest 01/14/2013   Complication of anesthesia    Fracture of tibia and fibula 01/27/2015   GERD (gastroesophageal reflux disease)    Hashimoto's thyroiditis 11/17/2017   Lab Results Component Value Date  TSH 2.17 11/17/2017  FREET4 0.87 11/17/2017   Lab Results Component Value Date  TSH 2.17 11/17/2017  TSH 2.72 09/08/2017  TSH 5.20 (H) 11/21/2016  TSH 3.813 01/29/2015  FREET4 0.87 11/17/2017  FREET4 0.83 11/21/2016     High cholesterol    Hypertension    PONV (postoperative nausea and vomiting), Scopalamine controls    Seasonal allergies    Subclinical hypothyroidism 11/27/2016   Syndesmotic disruption of right ankle 03/06/2015   Vitamin D deficiency 03/06/2015   Past Surgical History:  Procedure Laterality Date   BUNIONECTOMY Bilateral    COLONOSCOPY     HARDWARE REMOVAL Right 12/10/2015   Procedure: HARDWARE REMOVAL RIGHT ANKLE;  Surgeon: Myrene Galas, MD;  Location: [facility] OR;  Service: Orthopedics;  Laterality: Right;   MYOMECTOMY     ORIF TIBIA FRACTURE Right 01/29/2015   Procedure: OPEN REDUCTION INTERNAL FIXATION (ORIF) RIGHT  TIBIA  AND FIBULA FRACTURES;  Surgeon: Myrene Galas, MD;  Location: [facility] OR;  Service: Orthopedics;  Laterality: Right;   Family History  Problem Relation Age of Onset   CVA Mother    Heart disease Father    Diabetes Father    Heart attack Sister    CVA Sister    CVA Sister    CVA Sister    Breast cancer Sister    Social History   Socioeconomic History   Marital status: Married    Spouse name: Not on file   Number of children: Not on file   Years of education: Not on file   Highest education level: Not on file  Occupational History   Occupation: Retired Runner, broadcasting/film/video  Tobacco Use   Smoking  status: Never   Smokeless tobacco: Never  Vaping Use   Vaping status: Never Used  Substance and Sexual Activity   Alcohol use: Yes    Alcohol/week: 3.0 - 4.0 standard drinks of alcohol    Types: 3 - 4 Glasses of wine per week   Drug use: No   Sexual activity: Yes    Birth control/protection: Post-menopausal  Other Topics Concern   Not on file  Social History Narrative   Not on file   Social Determinants of Health   Financial Resource Strain: Low Risk  (09/01/2022)   Overall Financial Resource Strain (CARDIA)    Difficulty of Paying Living Expenses: Not hard at all  Food Insecurity: No Food Insecurity (09/01/2022)   Hunger Vital Sign    Worried About Running Out of Food in the Last Year: Never true    Ran Out of Food in the Last Year: Never true  Transportation Needs: No Transportation Needs (09/01/2022)   PRAPARE - Administrator, Civil Service (Medical): No    Lack of Transportation (Non-Medical): No  Physical Activity: Sufficiently Active (09/01/2022)   Exercise Vital Sign    Days of Exercise per Week: 6 days    Minutes of Exercise per Session: 90 min  Stress: Stress Concern Present (09/01/2022)   [employer] of Occupational Health - Occupational Stress Questionnaire    Feeling of Stress : To some extent  Social Connections: Moderately Isolated (09/01/2022)   Social Connection and Isolation Panel [NHANES]    Frequency of Communication with Friends and Family: More than three times a week    Frequency of Social Gatherings with Friends and Family: More than three times a week    Attends Religious Services: Never    Database administrator or Organizations: No    Attends Engineer, structural: Never    Marital Status: Married    Tobacco Counseling Counseling given: Not Answered   Clinical Intake:  Pre-visit preparation completed: Yes  Pain : No/denies pain     BMI - recorded: 26.85 Nutritional Status: BMI 25 -29 Overweight Nutritional  Risks: None Diabetes: No  How often do you need to have someone help you when you read instructions, pamphlets, or other written materials from your doctor or pharmacy?: 1 - Never  Interpreter Needed?: No  Information entered by :: Lanier Ensign, LPN   Activities of Daily Living    09/01/2022    9:41 AM  In your present state of health, do you have any difficulty performing the following activities:  Hearing? 0  Vision? 0  Difficulty concentrating or making decisions? 0  Walking or climbing stairs? 0  Dressing or bathing? 0  Doing errands, shopping? 0  Preparing  Food and eating ? N  Using the Toilet? N  In the past six months, have you accidently leaked urine? N  Do you have problems with loss of bowel control? N  Managing your Medications? N  Managing your Finances? N  Housekeeping or managing your Housekeeping? N    Patient Care Team: Willow Ora, MD as PCP - General (Family Medicine) Carlus Pavlov, MD as Consulting Physician (Internal Medicine) Lyn Records, MD (Inactive) as Consulting Physician (Cardiology)  Indicate any recent Medical Services you may have received from other than [facility] providers in the past year (date may be approximate).     Assessment:   This is a routine wellness examination for Takenya.  Hearing/Vision screen Hearing Screening - Comments:: Pt denies any hearing issue  Vision Screening - Comments:: Pt follows up with Dr Corky Mull for annual eye exams   Dietary issues and exercise activities discussed:     Goals Addressed             This Visit's Progress    Patient Stated       Lose weight        Depression Screen    09/01/2022    9:43 AM 10/27/2021    9:25 AM 08/24/2021   10:21 AM 04/26/2021    9:05 AM 08/24/2020    8:30 AM 08/06/2020    2:37 PM 02/24/2020    9:27 AM  PHQ 2/9 Scores  PHQ - 2 Score 0 0 0 0 0 0 0    Fall Risk    09/01/2022    9:47 AM 10/27/2021    9:24 AM 08/24/2021   10:24 AM 04/26/2021    9:05  AM 08/06/2020    2:42 PM  Fall Risk   Falls in the past year? 0 0 0 0 0  Number falls in past yr: 0 0 0 0 0  Injury with Fall? 0 0 0 0 0  Risk for fall due to : Impaired vision No Fall Risks Impaired vision;Impaired balance/gait No Fall Risks Impaired vision  Risk for fall due to: Comment   related to fluid in ear at times    Follow up Falls prevention discussed Falls evaluation completed Falls prevention discussed Falls evaluation completed Falls prevention discussed    MEDICARE RISK AT HOME:  Medicare Risk at Home - 09/01/22 0947     Any stairs in or around the home? Yes    If so, are there any without handrails? No    Home free of loose throw rugs in walkways, pet beds, electrical cords, etc? Yes    Adequate lighting in your home to reduce risk of falls? Yes    Life alert? No    Use of a cane, walker or w/c? No    Grab bars in the bathroom? No    Shower chair or bench in shower? Yes    Elevated toilet seat or a handicapped toilet? No             TIMED UP AND GO:  Was the test performed?  Yes  Length of time to ambulate 10 feet: 10 sec Gait steady and fast without use of assistive device    Cognitive Function:        09/01/2022    9:47 AM 08/24/2021   10:26 AM 08/06/2020    2:45 PM 04/12/2019    9:54 AM  6CIT Screen  What Year? 0 points 0 points 0 points 0 points  What month? 0  points 0 points 0 points 0 points  What time? 0 points 0 points 0 points 0 points  Count back from 20 0 points 0 points 0 points 0 points  Months in reverse 0 points 0 points 0 points 0 points  Repeat phrase 0 points 0 points 0 points 0 points  Total Score 0 points 0 points 0 points 0 points    Immunizations Immunization History  Administered Date(s) Administered   COVID-19, mRNA, vaccine(Comirnaty)12 years and older 02/15/2022   Fluad Quad(high Dose 65+) 11/19/2018, 11/27/2019, 11/19/2020, 10/27/2021   Influenza, High Dose Seasonal PF 11/21/2016, 11/17/2017   Influenza,inj,Quad PF,6+  Mos 01/05/2016   PFIZER Comirnaty(Gray Top)Covid-19 Tri-Sucrose Vaccine 09/06/2020   PFIZER(Purple Top)SARS-COV-2 Vaccination 03/13/2019, 04/03/2019, 12/19/2019   Pfizer Covid-19 Vaccine Bivalent Booster 70yrs & up 01/21/2021   Pneumococcal Conjugate-13 12/26/2014   Pneumococcal Polysaccharide-23 12/28/2012   Tdap 11/20/2013   Zoster Recombinant(Shingrix) 12/21/2017, 02/26/2018    TDAP status: Up to date  Flu Vaccine status: Up to date  Pneumococcal vaccine status: Up to date  Covid-19 vaccine status: Completed vaccines  Qualifies for Shingles Vaccine? Yes   Zostavax completed Yes   Shingrix Completed?: Yes  Screening Tests Health Maintenance  Topic Date Due   COVID-19 Vaccine (7 - 2023-24 season) 06/16/2022   INFLUENZA VACCINE  09/15/2022   MAMMOGRAM  04/06/2023   Medicare Annual Wellness (AWV)  09/01/2023   DTaP/Tdap/Td (2 - Td or Tdap) 11/21/2023   Fecal DNA (Cologuard)  04/26/2024   DEXA SCAN  03/09/2025   Pneumonia Vaccine 84+ Years old  Completed   Hepatitis C Screening  Completed   Zoster Vaccines- Shingrix  Completed   HPV VACCINES  Aged Out   Colonoscopy  Discontinued    Health Maintenance  Health Maintenance Due  Topic Date Due   COVID-19 Vaccine (7 - 2023-24 season) 06/16/2022    Colorectal cancer screening: Type of screening: Cologuard. Completed 04/26/21. Repeat every 3 years  Mammogram status: Completed 04/05/22. Repeat every year  Bone Density status: Completed 03/09/20. Results reflect: Bone density results: NORMAL. Repeat every 5 years.  Additional Screening:  Hepatitis C Screening: Completed 11/21/16  Vision Screening: Recommended annual ophthalmology exams for early detection of glaucoma and other disorders of the eye. Is the patient up to date with their annual eye exam?  Yes  Who is the provider or what is the name of the office in which the patient attends annual eye exams? Dr Corky Mull  If pt is not established with a provider, would  they like to be referred to a provider to establish care? No .   Dental Screening: Recommended annual dental exams for proper oral hygiene    Community Resource Referral / Chronic Care Management: CRR required this visit?  No   CCM required this visit?  No     Plan:     I have personally reviewed and noted the following in the patient's chart:   Medical and social history Use of alcohol, tobacco or illicit drugs  Current medications and supplements including opioid prescriptions. Patient is not currently taking opioid prescriptions. Functional ability and status Nutritional status Physical activity Advanced directives List of other physicians Hospitalizations, surgeries, and ER visits in previous 12 months Vitals Screenings to include cognitive, depression, and falls Referrals and appointments  In addition, I have reviewed and discussed with patient certain preventive protocols, quality metrics, and best practice recommendations. A written personalized care plan for preventive services as well as general preventive health recommendations were provided  to patient.     Marzella Schlein, LPN   [id number]/61/0960   After Visit Summary: (MyChart) Due to this being a telephonic visit, the after visit summary with patients personalized plan was offered to patient via MyChart   Nurse Notes: none

## 2022-09-01 NOTE — Patient Instructions (Signed)
[name] , Thank you for taking time to come for your Medicare Wellness Visit. I appreciate your ongoing commitment to your health goals. Please review the following plan we discussed and let me know if I can assist you in the future.   These are the goals we discussed:  Goals      Eat better     Decrease carbs and starches.      Patient Stated     Lose weight and stay away from sweets      Patient Stated     Continue exercise and lose weight         This is a list of the screening recommended for you and due dates:  Health Maintenance  Topic Date Due   COVID-19 Vaccine (7 - 2023-24 season) 06/16/2022   Flu Shot  09/15/2022   Mammogram  04/06/2023   Medicare Annual Wellness Visit  09/01/2023   DTaP/Tdap/Td vaccine (2 - Td or Tdap) 11/21/2023   Cologuard (Stool DNA test)  04/26/2024   DEXA scan (bone density measurement)  03/09/2025   Pneumonia Vaccine  Completed   Hepatitis C Screening  Completed   Zoster (Shingles) Vaccine  Completed   HPV Vaccine  Aged Out   Colon Cancer Screening  Discontinued    Advanced directives: Please bring a copy of your health care power of attorney and living will to the office at your convenience.  Conditions/risks identified: lose weight   Next appointment: Follow up in one year for your annual wellness visit    Preventive Care 65 Years and Older, Female Preventive care refers to lifestyle choices and visits with your health care provider that can promote health and wellness. What does preventive care include? A yearly physical exam. This is also called an annual well check. Dental exams once or twice a year. Routine eye exams. Ask your health care provider how often you should have your eyes checked. Personal lifestyle choices, including: Daily care of your teeth and gums. Regular physical activity. Eating a healthy diet. Avoiding tobacco and drug use. Limiting alcohol use. Practicing safe sex. Taking low-dose aspirin every  day. Taking vitamin and mineral supplements as recommended by your health care provider. What happens during an annual well check? The services and screenings done by your health care provider during your annual well check will depend on your age, overall health, lifestyle risk factors, and family history of disease. Counseling  Your health care provider may ask you questions about your: Alcohol use. Tobacco use. Drug use. Emotional well-being. Home and relationship well-being. Sexual activity. Eating habits. History of falls. Memory and ability to understand (cognition). Work and work Astronomer. Reproductive health. Screening  You may have the following tests or measurements: Height, weight, and BMI. Blood pressure. Lipid and cholesterol levels. These may be checked every 5 years, or more frequently if you are over 13 years old. Skin check. Lung cancer screening. You may have this screening every year starting at age 28 if you have a 30-pack-year history of smoking and currently smoke or have quit within the past 15 years. Fecal occult blood test (FOBT) of the stool. You may have this test every year starting at age 88. Flexible sigmoidoscopy or colonoscopy. You may have a sigmoidoscopy every 5 years or a colonoscopy every 10 years starting at age 80. Hepatitis C blood test. Hepatitis B blood test. Sexually transmitted disease (STD) testing. Diabetes screening. This is done by checking your blood sugar (glucose) after you have not  eaten for a while (fasting). You may have this done every 1-3 years. Bone density scan. This is done to screen for osteoporosis. You may have this done starting at age 64. Mammogram. This may be done every 1-2 years. Talk to your health care provider about how often you should have regular mammograms. Talk with your health care provider about your test results, treatment options, and if necessary, the need for more tests. Vaccines  Your health care  provider may recommend certain vaccines, such as: Influenza vaccine. This is recommended every year. Tetanus, diphtheria, and acellular pertussis (Tdap, Td) vaccine. You may need a Td booster every 10 years. Zoster vaccine. You may need this after age 82. Pneumococcal 13-valent conjugate (PCV13) vaccine. One dose is recommended after age 78. Pneumococcal polysaccharide (PPSV23) vaccine. One dose is recommended after age 7. Talk to your health care provider about which screenings and vaccines you need and how often you need them. This information is not intended to replace advice given to you by your health care provider. Make sure you discuss any questions you have with your health care provider. Document Released: 02/27/2015 Document Revised: 10/21/2015 Document Reviewed: 12/02/2014 Elsevier Interactive Patient Education  2017 [employer].  Fall Prevention in the Home Falls can cause injuries. They can happen to people of all ages. There are many things you can do to make your home safe and to help prevent falls. What can I do on the outside of my home? Regularly fix the edges of walkways and driveways and fix any cracks. Remove anything that might make you trip as you walk through a door, such as a raised step or threshold. Trim any bushes or trees on the path to your home. Use bright outdoor lighting. Clear any walking paths of anything that might make someone trip, such as rocks or tools. Regularly check to see if handrails are loose or broken. Make sure that both sides of any steps have handrails. Any raised decks and porches should have guardrails on the edges. Have any leaves, snow, or ice cleared regularly. Use sand or salt on walking paths during winter. Clean up any spills in your garage right away. This includes oil or grease spills. What can I do in the bathroom? Use night lights. Install grab bars by the toilet and in the tub and shower. Do not use towel bars as grab  bars. Use non-skid mats or decals in the tub or shower. If you need to sit down in the shower, use a plastic, non-slip stool. Keep the floor dry. Clean up any water that spills on the floor as soon as it happens. Remove soap buildup in the tub or shower regularly. Attach bath mats securely with double-sided non-slip rug tape. Do not have throw rugs and other things on the floor that can make you trip. What can I do in the bedroom? Use night lights. Make sure that you have a light by your bed that is easy to reach. Do not use any sheets or blankets that are too big for your bed. They should not hang down onto the floor. Have a firm chair that has side arms. You can use this for support while you get dressed. Do not have throw rugs and other things on the floor that can make you trip. What can I do in the kitchen? Clean up any spills right away. Avoid walking on wet floors. Keep items that you use a lot in easy-to-reach places. If you need to reach  something above you, use a strong step stool that has a grab bar. Keep electrical cords out of the way. Do not use floor polish or wax that makes floors slippery. If you must use wax, use non-skid floor wax. Do not have throw rugs and other things on the floor that can make you trip. What can I do with my stairs? Do not leave any items on the stairs. Make sure that there are handrails on both sides of the stairs and use them. Fix handrails that are broken or loose. Make sure that handrails are as long as the stairways. Check any carpeting to make sure that it is firmly attached to the stairs. Fix any carpet that is loose or worn. Avoid having throw rugs at the top or bottom of the stairs. If you do have throw rugs, attach them to the floor with carpet tape. Make sure that you have a light switch at the top of the stairs and the bottom of the stairs. If you do not have them, ask someone to add them for you. What else can I do to help prevent  falls? Wear shoes that: Do not have high heels. Have rubber bottoms. Are comfortable and fit you well. Are closed at the toe. Do not wear sandals. If you use a stepladder: Make sure that it is fully opened. Do not climb a closed stepladder. Make sure that both sides of the stepladder are locked into place. Ask someone to hold it for you, if possible. Clearly mark and make sure that you can see: Any grab bars or handrails. First and last steps. Where the edge of each step is. Use tools that help you move around (mobility aids) if they are needed. These include: Canes. Walkers. Scooters. Crutches. Turn on the lights when you go into a dark area. Replace any light bulbs as soon as they burn out. Set up your furniture so you have a clear path. Avoid moving your furniture around. If any of your floors are uneven, fix them. If there are any pets around you, be aware of where they are. Review your medicines with your doctor. Some medicines can make you feel dizzy. This can increase your chance of falling. Ask your doctor what other things that you can do to help prevent falls. This information is not intended to replace advice given to you by your health care provider. Make sure you discuss any questions you have with your health care provider. Document Released: 11/27/2008 Document Revised: 07/09/2015 Document Reviewed: 03/07/2014 Elsevier Interactive Patient Education  2017 [employer].

## 2022-10-18 ENCOUNTER — Encounter: Payer: Self-pay | Admitting: Family Medicine

## 2022-10-18 ENCOUNTER — Ambulatory Visit: Payer: Medicare PPO | Admitting: Family Medicine

## 2022-10-18 VITALS — BP 120/74 | HR 57 | Temp 97.7°F | Ht 62.0 in | Wt 142.0 lb

## 2022-10-18 DIAGNOSIS — H8110 Benign paroxysmal vertigo, unspecified ear: Secondary | ICD-10-CM

## 2022-10-18 DIAGNOSIS — Z23 Encounter for immunization: Secondary | ICD-10-CM

## 2022-10-18 DIAGNOSIS — I10 Essential (primary) hypertension: Secondary | ICD-10-CM

## 2022-10-18 MED ORDER — ONDANSETRON 4 MG PO TBDP: 4.0000 mg | ORAL_TABLET | Freq: Three times a day (TID) | ORAL | 0 refills | Status: DC | PRN

## 2022-10-18 MED ORDER — MECLIZINE HCL 25 MG PO TABS: 25.0000 mg | ORAL_TABLET | Freq: Three times a day (TID) | ORAL | 0 refills | Status: DC | PRN

## 2022-10-18 NOTE — Progress Notes (Signed)
Subjective  CC:  Chief Complaint  Patient presents with   Dizziness    Pt stated that she has ben having some dizzy spells that started 10/15/2022. Started feeling better yesterday.     HPI: [name] is a 77 y.o. female who presents to the office today to address the problems listed above in the chief complaint. C/o 2 weeks of intermittent vertigo with movement. Started when rolling over in bed or getting up out of bed. Wouldn't last long; however, Saturday, had severe episode associated with nausea and vomiting. Feeling better yesterday and today. No other neuro sxs: no diplopia, dysarthria, headache, f/c, neck pain or paresis. Had similar years ago but didn't last this long HTN: Feeling well. Taking medications w/o adverse effects. No symptoms of CHF, angina; no palpitations, sob, cp or lower extremity edema. Compliant with meds. Home readings normal: 120s/70s. Eligible for flu vaccine today.   Assessment  1. Benign paroxysmal positional vertigo, unspecified laterality   2. Essential hypertension   3. Encounter for immunization      Plan  BPPV:  education. No red flags identified. Meclizine. Pt requests zofran odt also. Monitor.  HTN: well controlled. Continue diuretic and low dose bb. Flu shot today  Follow up: march 2025 for cpe Visit date not found  Orders Placed This Encounter  Procedures   Flu Vaccine Trivalent High Dose (Fluad)   Meds ordered this encounter  Medications   meclizine (ANTIVERT) 25 MG tablet    Sig: Take 1 tablet (25 mg total) by mouth 3 (three) times daily as needed for dizziness.    Dispense:  30 tablet    Refill:  0   ondansetron (ZOFRAN-ODT) 4 MG disintegrating tablet    Sig: Take 1 tablet (4 mg total) by mouth every 8 (eight) hours as needed for nausea or vomiting.    Dispense:  20 tablet    Refill:  0      I reviewed the patients updated PMH, FH, and SocHx.    Patient Active Problem List   Diagnosis Date Noted   Screening for  colorectal cancer 10/27/2021   Osteoporosis screening 03/11/2020   Polycythemia 02/25/2019   IFG (impaired fasting glucose) 02/22/2019   Ectopic atrial tachycardia 02/21/2019   History of Hashimoto thyroiditis 11/17/2017   Seasonal allergies    GERD (gastroesophageal reflux disease)    Vitamin D deficiency 03/06/2015   Mixed hyperlipidemia    Essential hypertension 01/14/2013   Current Meds  Medication Sig   aspirin 81 MG tablet Take 81 mg by mouth daily.   cholecalciferol (VITAMIN D3) 25 MCG (1000 UT) tablet Take by mouth daily.   Coenzyme Q10 (COQ10) 100 MG CAPS Take 100 mg by mouth daily.   hydrochlorothiazide (HYDRODIURIL) 25 MG tablet TAKE 1 TABLET EVERY DAY   meclizine (ANTIVERT) 25 MG tablet Take 1 tablet (25 mg total) by mouth 3 (three) times daily as needed for dizziness.   metoprolol succinate (TOPROL-XL) 25 MG 24 hr tablet TAKE 1 TABLET EVERY DAY   Multiple Vitamin (MULTIVITAMIN) tablet Take 1 tablet by mouth daily.   ondansetron (ZOFRAN-ODT) 4 MG disintegrating tablet Take 1 tablet (4 mg total) by mouth every 8 (eight) hours as needed for nausea or vomiting.   rosuvastatin (CRESTOR) 20 MG tablet TAKE 1 TABLET EVERY DAY    Allergies: Patient has No Known Allergies. Family History: Patient family history includes Breast cancer in her sister; CVA in her mother, sister, sister, and sister; Diabetes in her father;  Heart attack in her sister; Heart disease in her father. Social History:  Patient  reports that she has never smoked. She has never used smokeless tobacco. She reports current alcohol use of about 3.0 - 4.0 standard drinks of alcohol per week. She reports that she does not use drugs.  Review of Systems: Constitutional: Negative for fever malaise or anorexia Cardiovascular: negative for chest pain Respiratory: negative for SOB or persistent cough Gastrointestinal: negative for abdominal pain  Objective  Vitals: BP 120/74 Comment: by home readings last week   Pulse (!) 57   Temp 97.7 F (36.5 C)   Ht 5\' 2"  (1.575 m)   Wt 142 lb (64.4 kg)   SpO2 97%   BMI 25.97 kg/m  General: no acute distress , A&Ox3 HEENT: PEERL, conjunctiva normal, no nystagmus, Tms clear bilaterally,neck is supple Cardiovascular:  RRR without murmur or gallop.  Respiratory:  Good breath sounds bilaterally, CTAB with normal respiratory effort Skin:  Warm, no rashes Neuro: nl cranial nerves, non focal, normal gait  Commons side effects, risks, benefits, and alternatives for medications and treatment plan prescribed today were discussed, and the patient expressed understanding of the given instructions. Patient is instructed to call or message via MyChart if he/she has any questions or concerns regarding our treatment plan. No barriers to understanding were identified. We discussed Red Flag symptoms and signs in detail. Patient expressed understanding regarding what to do in case of urgent or emergency type symptoms.  Medication list was reconciled, printed and provided to the patient in AVS. Patient instructions and summary information was reviewed with the patient as documented in the AVS. This note was prepared with assistance of Dragon voice recognition software. Occasional wrong-word or sound-a-like substitutions may have occurred due to the inherent limitations of voice recognition software

## 2022-10-18 NOTE — Patient Instructions (Signed)
Please return in March 2025 for your annual complete physical; please come fasting.   If you have any questions or concerns, please don't hesitate to send me a message via MyChart or call the office at [phone]. Thank you for visiting with [place] today! It's our pleasure caring for you.   Benign Positional Vertigo Vertigo is the feeling that you or your surroundings are moving when they are not. Benign positional vertigo is the most common form of vertigo. This is usually a harmless condition (benign). This condition is positional. This means that symptoms are triggered by certain movements and positions. This condition can be dangerous if it occurs while you are doing something that could cause harm to yourself or others. This includes activities such as driving or operating machinery. What are the causes? The inner ear has fluid-filled canals that help your brain sense movement and balance. When the fluid moves, the brain receives messages about your body's position. With benign positional vertigo, calcium crystals in the inner ear break free and disturb the inner ear area. This causes your brain to receive confusing messages about your body's position. What increases the risk? You are more likely to develop this condition if: You are a woman. You are 75 years of age or older. You have recently had a head injury. You have an inner ear disease. What are the signs or symptoms? Symptoms of this condition usually happen when you move your head or your eyes in different directions. Symptoms may start suddenly and usually last for less than a minute. They include: Loss of balance and falling. Feeling like you are spinning or moving. Feeling like your surroundings are spinning or moving. Nausea and vomiting. Blurred vision. Dizziness. Involuntary eye movement (nystagmus). Symptoms can be mild and cause only minor problems, or they can be severe and interfere with daily life. Episodes of benign  positional vertigo may return (recur) over time. Symptoms may also improve over time. How is this diagnosed? This condition may be diagnosed based on: Your medical history. A physical exam of the head, neck, and ears. Positional tests to check for or stimulate vertigo. You may be asked to turn your head and change positions, such as going from sitting to lying down. A health care provider will watch for symptoms of vertigo. You may be referred to a health care provider who specializes in ear, nose, and throat problems (ENT or otolaryngologist) or a provider who specializes in disorders of the nervous system (neurologist). How is this treated?  This condition may be treated in a session in which your health care provider moves your head in specific positions to help the displaced crystals in your inner ear move. Treatment for this condition may take several sessions. Surgery may be needed in severe cases, but this is rare. In some cases, benign positional vertigo may resolve on its own in 2-4 weeks. Follow these instructions at home: Safety Move slowly. Avoid sudden body or head movements or certain positions, as told by your health care provider. Avoid driving or operating machinery until your health care provider says it is safe. Avoid doing any tasks that would be dangerous to you or others if vertigo occurs. If you have trouble walking or keeping your balance, try using a cane for stability. If you feel dizzy or unstable, sit down right away. Return to your normal activities as told by your health care provider. Ask your health care provider what activities are safe for you. General instructions Take over-the-counter and  prescription medicines only as told by your health care provider. Drink enough fluid to keep your urine pale yellow. Keep all follow-up visits. This is important. Contact a health care provider if: You have a fever. Your condition gets worse or you develop new  symptoms. Your family or friends notice any behavioral changes. You have nausea or vomiting that gets worse. You have numbness or a prickling and tingling sensation. Get help right away if you: Have difficulty speaking or moving. Are always dizzy or faint. Develop severe headaches. Have weakness in your legs or arms. Have changes in your hearing or vision. Develop a stiff neck. Develop sensitivity to light. These symptoms may represent a serious problem that is an emergency. Do not wait to see if the symptoms will go away. Get medical help right away. Call your local emergency services (911 in the [place] Do not drive yourself to the hospital. Summary Vertigo is the feeling that you or your surroundings are moving when they are not. Benign positional vertigo is the most common form of vertigo. This condition is caused by calcium crystals in the inner ear that become displaced. This causes a disturbance in an area of the inner ear that helps your brain sense movement and balance. Symptoms include loss of balance and falling, feeling that you or your surroundings are moving, nausea and vomiting, and blurred vision. This condition can be diagnosed based on symptoms, a physical exam, and positional tests. Follow safety instructions as told by your health care provider and keep all follow-up visits. This is important. This information is not intended to replace advice given to you by your health care provider. Make sure you discuss any questions you have with your health care provider. Document Revised: 01/01/2020 Document Reviewed: 01/01/2020 Elsevier Patient Education  2024 [employer].

## 2022-10-19 ENCOUNTER — Encounter: Payer: Medicare PPO | Admitting: Family Medicine

## 2022-12-05 ENCOUNTER — Encounter: Payer: Self-pay | Admitting: Family Medicine

## 2022-12-05 ENCOUNTER — Ambulatory Visit (INDEPENDENT_AMBULATORY_CARE_PROVIDER_SITE_OTHER): Payer: Medicare PPO | Admitting: Family Medicine

## 2022-12-05 VITALS — BP 130/70 | HR 56 | Temp 98.2°F | Ht 62.0 in | Wt 143.2 lb

## 2022-12-05 DIAGNOSIS — D751 Secondary polycythemia: Secondary | ICD-10-CM

## 2022-12-05 DIAGNOSIS — Z8639 Personal history of other endocrine, nutritional and metabolic disease: Secondary | ICD-10-CM

## 2022-12-05 DIAGNOSIS — E782 Mixed hyperlipidemia: Secondary | ICD-10-CM | POA: Diagnosis not present

## 2022-12-05 DIAGNOSIS — H8110 Benign paroxysmal vertigo, unspecified ear: Secondary | ICD-10-CM | POA: Diagnosis not present

## 2022-12-05 DIAGNOSIS — I10 Essential (primary) hypertension: Secondary | ICD-10-CM

## 2022-12-05 DIAGNOSIS — E559 Vitamin D deficiency, unspecified: Secondary | ICD-10-CM | POA: Diagnosis not present

## 2022-12-05 DIAGNOSIS — I4719 Other supraventricular tachycardia: Secondary | ICD-10-CM | POA: Diagnosis not present

## 2022-12-05 DIAGNOSIS — Z0000 Encounter for general adult medical examination without abnormal findings: Secondary | ICD-10-CM | POA: Diagnosis not present

## 2022-12-05 DIAGNOSIS — Z0001 Encounter for general adult medical examination with abnormal findings: Secondary | ICD-10-CM

## 2022-12-05 DIAGNOSIS — R7301 Impaired fasting glucose: Secondary | ICD-10-CM | POA: Diagnosis not present

## 2022-12-05 LAB — COMPREHENSIVE METABOLIC PANEL
ALT: 26 U/L (ref 0–35)
AST: 26 U/L (ref 0–37)
Albumin: 4.4 g/dL (ref 3.5–5.2)
Alkaline Phosphatase: 52 U/L (ref 39–117)
BUN: 16 mg/dL (ref 6–23)
CO2: 32 meq/L (ref 19–32)
Calcium: 9.7 mg/dL (ref 8.4–10.5)
Chloride: 99 meq/L (ref 96–112)
Creatinine, Ser: 0.84 mg/dL (ref 0.40–1.20)
GFR: 67.95 mL/min (ref >60.00)
Glucose, Bld: 114 mg/dL — ABNORMAL HIGH (ref 70–99)
Potassium: 3.6 meq/L (ref 3.5–5.1)
Sodium: 141 meq/L (ref 135–145)
Total Bilirubin: 0.5 mg/dL (ref 0.2–1.2)
Total Protein: 7 g/dL (ref 6.0–8.3)

## 2022-12-05 LAB — CBC WITH DIFFERENTIAL/PLATELET
Basophils Absolute: 0 10*3/uL (ref 0.0–0.1)
Basophils Relative: 0.7 % (ref 0.0–3.0)
Eosinophils Absolute: 0.1 10*3/uL (ref 0.0–0.7)
Eosinophils Relative: 1.2 % (ref 0.0–5.0)
HCT: 46.9 % — ABNORMAL HIGH (ref 36.0–46.0)
Hemoglobin: 15.7 g/dL — ABNORMAL HIGH (ref 12.0–15.0)
Lymphocytes Relative: 20.4 % (ref 12.0–46.0)
Lymphs Abs: 1.2 10*3/uL (ref 0.7–4.0)
MCHC: 33.6 g/dL (ref 30.0–36.0)
MCV: 92.4 fL (ref 78.0–100.0)
Monocytes Absolute: 0.4 10*3/uL (ref 0.1–1.0)
Monocytes Relative: 6.4 % (ref 3.0–12.0)
Neutro Abs: 4.2 10*3/uL (ref 1.4–7.7)
Neutrophils Relative %: 71.3 % (ref 43.0–77.0)
Platelets: 255 10*3/uL (ref 150.0–400.0)
RBC: 5.07 Mil/uL (ref 3.87–5.11)
RDW: 13.2 % (ref 11.5–15.5)
WBC: 5.9 10*3/uL (ref 4.0–10.5)

## 2022-12-05 LAB — LIPID PANEL
Cholesterol: 184 mg/dL (ref 0–200)
HDL: 82.9 mg/dL (ref >39.00)
LDL Cholesterol: 80 mg/dL (ref 0–99)
NonHDL: 101.31
Total CHOL/HDL Ratio: 2
Triglycerides: 106 mg/dL (ref 0.0–149.0)
VLDL: 21.2 mg/dL (ref 0.0–40.0)

## 2022-12-05 LAB — VITAMIN D 25 HYDROXY (VIT D DEFICIENCY, FRACTURES): VITD: 27.26 ng/mL — ABNORMAL LOW (ref 30.00–100.00)

## 2022-12-05 LAB — HEMOGLOBIN A1C: Hgb A1c MFr Bld: 5.9 % (ref 4.6–6.5)

## 2022-12-05 LAB — TSH: TSH: 1.92 u[IU]/mL (ref 0.35–5.50)

## 2022-12-05 MED ORDER — HYDROCHLOROTHIAZIDE 25 MG PO TABS: 25.0000 mg | ORAL_TABLET | Freq: Every day | ORAL | 3 refills | Status: DC

## 2022-12-05 NOTE — Progress Notes (Signed)
Subjective  Chief Complaint  Patient presents with   Annual Exam    Pt here for Annual Exam and is not currently fasting    Hypertension    HPI: [name] is a 77 y.o. female who presents to [facility] [place] today for a Female Wellness Visit. She also has the concerns and/or needs as listed above in the chief complaint. These will be addressed in addition to the Health Maintenance Visit.   Wellness Visit: annual visit with health maintenance review and exam  HM: screens are up to date. Feeling fine but still dealing with vertigo. See below. Imm up to date.  Chronic disease f/u and/or acute problem visit: (deemed necessary to be done in addition to the wellness visit): BPPV: dxd 10/18/22: but still symptomatic about twice per week. No new sxs. + vertigo w/ mvt and nausea. Some allergy sxs but no ear pain or hearing loss.  HTN: home readings show good control on metoprolol and hydrochlorothiazide.  HLD on statin for nonfasting recheck Prediabetes: eating clean. No sxs of hyperglycemia.  No palpiations: maintained on bb On vit D  Assessment  1. Encounter for well adult exam with abnormal findings   2. Essential hypertension   3. Mixed hyperlipidemia   4. Vitamin D deficiency   5. History of Hashimoto thyroiditis   6. IFG (impaired fasting glucose)   7. Polycythemia   8. Ectopic atrial tachycardia (HCC)   9. Benign paroxysmal positional vertigo, unspecified laterality      Plan  Female Wellness Visit: Age appropriate Health Maintenance and Prevention measures were discussed with patient. Included topics are cancer screening recommendations, ways to keep healthy (see AVS) including dietary and exercise recommendations, regular eye and dental care, use of seat belts, and avoidance of moderate alcohol use and tobacco use.  BMI: discussed patient's BMI and encouraged positive lifestyle modifications to help get to or maintain a target BMI. HM needs and  immunizations were addressed and ordered. See below for orders. See HM and immunization section for updates. Routine labs and screening tests ordered including cmp, cbc and lipids where appropriate. Discussed recommendations regarding Vit D and calcium supplementation (see AVS)  Chronic disease management visit and/or acute problem visit: HTN is controlled. Continue metoprolol 25 and hydrochlorothiazide 25. Check renal function and electrolytes. HLD on crestor 20 for recheck today with lfts Monitoring tsh, Vit D and A1c. She follows healthy lifestyle.  Refer for vestibular rehab for vertigo.  Add allergy pill.   Follow up: 12 mo for cpe  Orders Placed This Encounter  Procedures   Lipid panel   Comprehensive metabolic panel   CBC with Differential/Platelet   Hemoglobin A1c   VITAMIN D 25 Hydroxy (Vit-D Deficiency, Fractures)   TSH   Ambulatory Referral to Neuro Rehab   Meds ordered this encounter  Medications   hydrochlorothiazide (HYDRODIURIL) 25 MG tablet    Sig: Take 1 tablet (25 mg total) by mouth daily.    Dispense:  90 tablet    Refill:  3      Body mass index is 26.19 kg/m. Wt Readings from Last 3 Encounters:  12/05/22 143 lb 3.2 oz (65 kg)  10/18/22 142 lb (64.4 kg)  09/01/22 146 lb 12.8 oz (66.6 kg)     Patient Active Problem List   Diagnosis Date Noted Date Diagnosed   Screening for colorectal cancer 10/27/2021     Negative cologuard 2018 and 2023    Osteoporosis screening 03/11/2020  Normal Dexa 2017 and 2022    Polycythemia 02/25/2019     2016 noted, then improved until 2017: persistently elevated hgb since. Last 16.5; evaluated by hematology 2021: neg JAK2. High "Normal" hgb, no further eval or treatment needed.     IFG (impaired fasting glucose) 02/22/2019    Ectopic atrial tachycardia (HCC) 02/21/2019    History of Hashimoto thyroiditis 11/17/2017            Seasonal allergies     GERD (gastroesophageal reflux disease)     Vitamin D  deficiency 03/06/2015    Mixed hyperlipidemia     Essential hypertension 01/14/2013    Health Maintenance  Topic Date Due   COVID-19 Vaccine (7 - 2023-24 season) 12/21/2022 (Originally 10/16/2022)   MAMMOGRAM  04/06/2023   Medicare Annual Wellness (AWV)  09/01/2023   DTaP/Tdap/Td (2 - Td or Tdap) 11/21/2023   Fecal DNA (Cologuard)  04/26/2024   DEXA SCAN  03/09/2025   Pneumonia Vaccine 63+ Years old  Completed   INFLUENZA VACCINE  Completed   Hepatitis C Screening  Completed   Zoster Vaccines- Shingrix  Completed   HPV VACCINES  Aged Out   Colonoscopy  Discontinued   Immunization History  Administered Date(s) Administered   Fluad Quad(high Dose 65+) 11/19/2018, 11/27/2019, 11/19/2020, 10/27/2021   Fluad Trivalent(High Dose 65+) 10/18/2022   Influenza, High Dose Seasonal PF 11/21/2016, 11/17/2017   Influenza,inj,Quad PF,6+ Mos 01/05/2016   PFIZER Comirnaty(Gray Top)Covid-19 Tri-Sucrose Vaccine 09/06/2020   PFIZER(Purple Top)SARS-COV-2 Vaccination 03/13/2019, 04/03/2019, 12/19/2019   Pfizer Covid-19 Vaccine Bivalent Booster 35yrs & up 01/21/2021   Pfizer(Comirnaty)Fall Seasonal Vaccine 12 years and older 02/15/2022   Pneumococcal Conjugate-13 12/26/2014   Pneumococcal Polysaccharide-23 12/28/2012   Tdap 11/20/2013   Zoster Recombinant(Shingrix) 12/21/2017, 02/26/2018   We updated and reviewed the patient's past history in detail and it is documented below. Allergies: Patient has No Known Allergies. Past Medical History Patient  has a past medical history of Arthritis, Chest pain at rest (01/14/2013), Complication of anesthesia, Fracture of tibia and fibula (01/27/2015), GERD (gastroesophageal reflux disease), Hashimoto's thyroiditis (11/17/2017), High cholesterol, Hypertension, PONV (postoperative nausea and vomiting), Scopalamine controls, Seasonal allergies, Subclinical hypothyroidism (11/27/2016), Syndesmotic disruption of right ankle (03/06/2015), and Vitamin D deficiency  (03/06/2015). Past Surgical History Patient  has a past surgical history that includes ORIF tibia fracture (Right, 01/29/2015); Bunionectomy (Bilateral); Colonoscopy; Hardware Removal (Right, 12/10/2015); and Myomectomy. Family History: Patient family history includes Breast cancer in her sister; CVA in her mother, sister, sister, and sister; Diabetes in her father; Heart attack in her sister; Heart disease in her father. Social History:  Patient  reports that she has never smoked. She has never used smokeless tobacco. She reports current alcohol use of about 3.0 - 4.0 standard drinks of alcohol per week. She reports that she does not use drugs.  Review of Systems: Constitutional: negative for fever or malaise Ophthalmic: negative for photophobia, double vision or loss of vision Cardiovascular: negative for chest pain, dyspnea on exertion, or new LE swelling Respiratory: negative for SOB or persistent cough Gastrointestinal: negative for abdominal pain, change in bowel habits or melena Genitourinary: negative for dysuria or gross hematuria, no abnormal uterine bleeding or disharge Musculoskeletal: negative for new gait disturbance or muscular weakness Integumentary: negative for new or persistent rashes, no breast lumps Neurological: negative for TIA or stroke symptoms Psychiatric: negative for SI or delusions Allergic/Immunologic: negative for hives  Patient Care Team    Relationship Specialty Notifications Start End  Willow Ora, MD  PCP - General Family Medicine  02/21/19   Carlus Pavlov, MD Consulting Physician Internal Medicine  08/15/18   Lyn Records, MD (Inactive) Consulting Physician Cardiology  08/15/18     Objective  Vitals: BP 130/70   Pulse (!) 56   Temp 98.2 F (36.8 C)   Ht 5\' 2"  (1.575 m)   Wt 143 lb 3.2 oz (65 kg)   SpO2 96%   BMI 26.19 kg/m  General:  Well developed, well nourished, no acute distress  Psych:  Alert and orientedx3,normal mood and  affect HEENT:  Normocephalic, atraumatic, non-icteric sclera,  supple neck without adenopathy, mass or thyromegaly Cardiovascular:  Normal S1, S2, RRR without gallop, rub or murmur Respiratory:  Good breath sounds bilaterally, CTAB with normal respiratory effort Gastrointestinal: normal bowel sounds, soft, non-tender, no noted masses. No HSM MSK: extremities without edema, joints without erythema or swelling Neurologic:    Mental status is normal.  Gross motor and sensory exams are normal.  No tremor  Commons side effects, risks, benefits, and alternatives for medications and treatment plan prescribed today were discussed, and the patient expressed understanding of the given instructions. Patient is instructed to call or message via MyChart if he/she has any questions or concerns regarding our treatment plan. No barriers to understanding were identified. We discussed Red Flag symptoms and signs in detail. Patient expressed understanding regarding what to do in case of urgent or emergency type symptoms.  Medication list was reconciled, printed and provided to the patient in AVS. Patient instructions and summary information was reviewed with the patient as documented in the AVS. This note was prepared with assistance of Dragon voice recognition software. Occasional wrong-word or sound-a-like substitutions may have occurred due to the inherent limitations of voice recognition software

## 2022-12-05 NOTE — Patient Instructions (Signed)
Please return in 12 months for your annual complete physical; please come fasting.   I will release your lab results to you on your MyChart account with further instructions. You may see the results before I do, but when I review them I will send you a message with my report or have my assistant call you if things need to be discussed. Please reply to my message with any questions. Thank you!   We will call you with information regarding your referral appointment. Vestibular rehab for your vertigo.  If you do not hear from [place] within the next 2 weeks, please let me know. It can take 1-2 weeks to get appointments set up with the specialists.    If you have any questions or concerns, please don't hesitate to send me a message via MyChart or call the office at [phone]. Thank you for visiting with [place] today! It's our pleasure caring for you.

## 2022-12-12 NOTE — Therapy (Signed)
OUTPATIENT PHYSICAL THERAPY VESTIBULAR EVALUATION     Patient Name: [name] MRN: [id number] DOB:1945-11-24, 77 y.o., female Today's Date: 12/13/2022  END OF SESSION:  PT End of Session - 12/13/22 1120     Visit Number 1    Number of Visits 5    Date for PT Re-Evaluation 01/10/23    Authorization Type [employer]    Authorization Time Period auth submitted    PT Start Time 1017    PT Stop Time 1056    PT Time Calculation (min) 39 min    Activity Tolerance Patient tolerated treatment well    Behavior During Therapy [facility] for tasks assessed/performed             Past Medical History:  Diagnosis Date   Arthritis    Chest pain at rest 01/14/2013   Complication of anesthesia    Fracture of tibia and fibula 01/27/2015   GERD (gastroesophageal reflux disease)    Hashimoto's thyroiditis 11/17/2017   Lab Results Component Value Date  TSH 2.17 11/17/2017  FREET4 0.87 11/17/2017   Lab Results Component Value Date  TSH 2.17 11/17/2017  TSH 2.72 09/08/2017  TSH 5.20 (H) 11/21/2016  TSH 3.813 01/29/2015  FREET4 0.87 11/17/2017  FREET4 0.83 11/21/2016     High cholesterol    Hypertension    PONV (postoperative nausea and vomiting), Scopalamine controls    Seasonal allergies    Subclinical hypothyroidism 11/27/2016   Syndesmotic disruption of right ankle 03/06/2015   Vitamin D deficiency 03/06/2015   Past Surgical History:  Procedure Laterality Date   BUNIONECTOMY Bilateral    COLONOSCOPY     HARDWARE REMOVAL Right 12/10/2015   Procedure: HARDWARE REMOVAL RIGHT ANKLE;  Surgeon: Myrene Galas, MD;  Location: [facility] OR;  Service: Orthopedics;  Laterality: Right;   MYOMECTOMY     ORIF TIBIA FRACTURE Right 01/29/2015   Procedure: OPEN REDUCTION INTERNAL FIXATION (ORIF) RIGHT TIBIA  AND FIBULA FRACTURES;  Surgeon: Myrene Galas, MD;  Location: [facility] OR;  Service: Orthopedics;  Laterality: Right;   Patient Active Problem List   Diagnosis Date Noted   Screening for colorectal cancer  10/27/2021   Osteoporosis screening 03/11/2020   Polycythemia 02/25/2019   IFG (impaired fasting glucose) 02/22/2019   Ectopic atrial tachycardia (HCC) 02/21/2019   History of Hashimoto thyroiditis 11/17/2017   Seasonal allergies    GERD (gastroesophageal reflux disease)    Vitamin D deficiency 03/06/2015   Mixed hyperlipidemia    Essential hypertension 01/14/2013    PCP:   Willow Ora, MD   REFERRING PROVIDER:   Willow Ora, MD    REFERRING DIAG:  H81.10 (ICD-10-CM) - Benign paroxysmal positional vertigo, unspecified laterality  THERAPY DIAG:  Dizziness and giddiness  Unsteadiness on feet  ONSET DATE: start of Sept 2024.   Rationale for Evaluation and Treatment: Rehabilitation  SUBJECTIVE:   SUBJECTIVE STATEMENT:  Patient reports that started having episodes of dizziness 4 years ago. However a month ago, it was pretty bad. Had dizziness and nausea which lasted an hour. Better with anti-nausea meds and sleeping reclined. Worse when getting out of bed. Denies head trauma, infection/illness, vision changes/double vision, hearing loss, migraines. Reports chronic tinnitus, recently having trouble with congestion, and recent imbalance with quick turns.   Pt accompanied by: self  PERTINENT HISTORY: Tib/fib fx 2016, GERD, HLD, HTN  PAIN:  Are you having pain? No  PRECAUTIONS: Fall  RED FLAGS: None   WEIGHT BEARING RESTRICTIONS: No  FALLS: Has patient fallen in  last 6 months? No  LIVING ENVIRONMENT: Lives with: lives with their spouse Lives in: House/apartment Stairs:  3 steps to enter; 2nd story home Has following equipment at home: Single point cane and Environmental consultant - 2 wheeled  PLOF: Independent; retired   PATIENT GOALS: improve dizziness   OBJECTIVE:  Note: Objective measures were completed at Evaluation unless otherwise noted.  DIAGNOSTIC FINDINGS: none recent  COGNITION: Overall cognitive status: Within functional limits for tasks  assessed   SENSATION: Pt denies N/T  POSTURE:  No Significant postural limitations  GAIT: Gait pattern: WFL Assistive device utilized: None Level of assistance: Complete Independence   PATIENT SURVEYS:  FOTO [phone], 61  VESTIBULAR ASSESSMENT:  GENERAL OBSERVATION: pt wears progressives    OCULOMOTOR EXAM:  Ocular Alignment: normal  Ocular ROM: No Limitations  Spontaneous Nystagmus: absent  Gaze-Induced Nystagmus: absent  Smooth Pursuits: intact  Saccades: intact    VESTIBULAR - OCULAR REFLEX:   Slow VOR: Normal  VOR Cancellation: Normal  Head-Impulse Test: HIT Right: covert positive HIT Left: positive    POSITIONAL TESTING:  Right Roll Test: possible few beats geotropic nystagmus but pt asymptomatic Left Roll Test: negative; c/o very little dizziness upon rolling back to midline   Right Sidelying: negative Left Sidelying: negative  Right Dix-Hallpike: possible few beats upbeating torsional nystagmus but difficult to discern d/t saccadic intrusions; pt asymptomatic Left Dix-Hallpike: negative    VESTIBULAR TREATMENT:                                                                                                   DATE: 12/13/22  PATIENT EDUCATION: Education details: prognosis, POC, HEP, answered pt's questions on BPPV etiology and risk factors  Person educated: Patient Education method: Explanation, Demonstration, Tactile cues, Verbal cues, and Handouts Education comprehension: verbalized understanding and returned demonstration   HOME EXERCISE PROGRAM: Access Code: [email or web address] URL: [email or web address] Date: 12/13/2022 Prepared by: [facility] - Outpatient  Rehab - [facility]  Exercises - Brandt-Daroff Vestibular Exercise  - 1 x daily - 5 x weekly - 2 sets - 3-5 reps - Right Sidelying to Left Sidelying Vestibular Habituation  - 1 x daily - 5 x weekly - 2 sets - 3-5 reps - Supine to Left Sidelying Vestibular Habituation  - 1 x daily -  5 x weekly - 2 sets - 3-5 reps - 180 Degree Pivot Turn with Single Point Cane  - 1 x daily - 5 x weekly - 2 sets - 10 reps   GOALS: Goals reviewed with patient? Yes  SHORT TERM GOALS: Target date: 12/27/2022  Patient to be independent with initial HEP. Baseline: HEP initiated Goal status: INITIAL    LONG TERM GOALS: Target date: 01/10/2023  Patient to be independent with advanced HEP. Baseline: Not yet initiated  Goal status: INITIAL  Patient to report resolution of dizziness.  Baseline: Symptomatic  Goal status: INITIAL  Patient to demonstrate mild-moderate sway with M-CTSIB condition with eyes closed/foam surface in order to improve safety in environments with uneven surfaces and dim lighting. Baseline: NT Goal status: INITIAL  Patient to score at least 24/30  on FGA in order to decrease risk of falls. Baseline: NT Goal status: INITIAL  Patient to score at least 61 on FOTO in order to indicate improved functional outcomes.  Baseline: 52 Goal status: INITIAL   ASSESSMENT:  CLINICAL IMPRESSION:   Patient is a 78 y/o F presenting to OPPT with c/o dizziness for the past 2 months. Also reports chronic tinnitus, recently having trouble with congestion, and recent imbalance with quick turns. Denies head trauma, infection/illness, vision changes/double vision, hearing loss, migraines. 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 Plan for balance testing next session. Patient was educated on gentle habituation HEP and reported understanding. Would benefit from skilled PT services 1x/week for 4 weeks to address aforementioned impairments in order to optimize level of function.    OBJECTIVE IMPAIRMENTS: decreased balance and dizziness.   ACTIVITY LIMITATIONS: bending, sleeping, stairs, and locomotion level  PARTICIPATION LIMITATIONS: yard work  PERSONAL FACTORS: Age, Past/current  experiences, Time since onset of injury/illness/exacerbation, and 3+ comorbidities: Tib/fib fx 2016, GERD, HLD, HTN,  are also affecting patient's functional outcome.   REHAB POTENTIAL: Good  CLINICAL DECISION MAKING: Evolving/moderate complexity  EVALUATION COMPLEXITY: Moderate   PLAN:  PT FREQUENCY: 1x/week  PT DURATION: 4 weeks  PLANNED INTERVENTIONS: 97164- PT Re-evaluation, 97110-Therapeutic exercises, 97530- Therapeutic activity, [id number]- Neuromuscular re-education, 97535- Self Care, 78469- Manual therapy, [id number]- Gait training, [phone]- Canalith repositioning, [phone]- Aquatic Therapy, Patient/Family education, Balance training, Stair training, Taping, Dry Needling, Vestibular training, Cryotherapy, and Moist heat  PLAN FOR NEXT SESSION: recheck positional tests, FGA, MCTSIB   Referring diagnosis? H81.10 (ICD-10-CM) - Benign paroxysmal positional vertigo, unspecified laterality Treatment diagnosis? (if different than referring diagnosis)  Dizziness and giddiness, Unsteadiness on feet What was this (referring dx) caused by? []  Surgery []  Fall []  Ongoing issue []  Arthritis [x]  Other: __insidious__________  Laterality: []  Rt []  Lt [x]  Both  Check all possible CPT codes:  *CHOOSE 10 OR LESS*    []  97110 (Therapeutic Exercise)  []  92507 (SLP Treatment)  []  97112 (Neuro Re-ed)   []  92526 (Swallowing Treatment)   []  97116 (Gait Training)   []  [id number] (Cognitive Training, 1st 15 minutes) []  44010 (Manual Therapy)   []  97130 (Cognitive Training, each add'l 15 minutes)  []  97164 (Re-evaluation)                              []  Other, List CPT Code ____________  []  97530 (Therapeutic Activities)     []  97535 (Self Care)   [x]  All codes above (97110 - 97535)  []  97012 (Mechanical Traction)  []  97014 (E-stim Unattended)  []  97032 (E-stim manual)  []  97033 (Ionto)  []  97035 (Ultrasound) []  97750 (Physical Performance Training) []  [id number] (Aquatic Therapy) []  97016 (Vasopneumatic  Device) []  [id number] (Paraffin) []  97034 (Contrast Bath) []  97597 (Wound Care 1st 20 sq cm) []  97598 (Wound Care each add'l 20 sq cm) []  97760 (Orthotic Fabrication, Fitting, Training Initial) []  [id number] (Prosthetic Management and Training Initial) []  [id number] (Orthotic or Prosthetic Training/ Modification Subsequent)   Anette Guarneri, PT, DPT 12/13/22 11:26 AM  [facility] [address] [place], [place] [address] Phone # [phone] Fax # [phone]

## 2022-12-13 ENCOUNTER — Other Ambulatory Visit: Payer: Self-pay

## 2022-12-13 ENCOUNTER — Ambulatory Visit: Payer: Medicare PPO | Attending: Family Medicine | Admitting: Physical Therapy

## 2022-12-13 ENCOUNTER — Encounter: Payer: Self-pay | Admitting: Physical Therapy

## 2022-12-13 DIAGNOSIS — R2681 Unsteadiness on feet: Secondary | ICD-10-CM | POA: Insufficient documentation

## 2022-12-13 DIAGNOSIS — R42 Dizziness and giddiness: Secondary | ICD-10-CM | POA: Insufficient documentation

## 2022-12-13 NOTE — Progress Notes (Signed)
See mychart note Dear Ms. Elick, Your lab results look good overall. Your cholesterol, kidney, liver, thyroid and diabetes test results are all normal. Your vitamin D level is just below normal: please double your dose of otc vit D that you are taking daily.  No other changes are needed at this time.  Sincerely, Dr. Mardelle Matte

## 2022-12-14 ENCOUNTER — Other Ambulatory Visit: Payer: Self-pay | Admitting: Family Medicine

## 2022-12-22 ENCOUNTER — Ambulatory Visit: Payer: Medicare PPO | Admitting: Physical Therapy

## 2023-02-18 ENCOUNTER — Other Ambulatory Visit: Payer: Self-pay | Admitting: Family Medicine

## 2023-03-06 DIAGNOSIS — H5319 Other subjective visual disturbances: Secondary | ICD-10-CM | POA: Diagnosis not present

## 2023-03-06 DIAGNOSIS — G43B1 Ophthalmoplegic migraine, intractable: Secondary | ICD-10-CM | POA: Diagnosis not present

## 2023-03-14 ENCOUNTER — Other Ambulatory Visit: Payer: Self-pay | Admitting: Family Medicine

## 2023-03-14 DIAGNOSIS — Z1231 Encounter for screening mammogram for malignant neoplasm of breast: Secondary | ICD-10-CM

## 2023-03-22 ENCOUNTER — Other Ambulatory Visit: Payer: Self-pay | Admitting: Family Medicine

## 2023-04-11 ENCOUNTER — Ambulatory Visit
Admission: RE | Admit: 2023-04-11 | Discharge: 2023-04-11 | Disposition: A | Discharge status: Home / Self Care | Payer: Medicare PPO | Source: Ambulatory Visit

## 2023-04-11 DIAGNOSIS — Z1231 Encounter for screening mammogram for malignant neoplasm of breast: Secondary | ICD-10-CM

## 2023-04-14 ENCOUNTER — Other Ambulatory Visit: Payer: Self-pay | Admitting: Family Medicine

## 2023-04-14 DIAGNOSIS — R928 Other abnormal and inconclusive findings on diagnostic imaging of breast: Secondary | ICD-10-CM

## 2023-04-19 ENCOUNTER — Ambulatory Visit
Admission: RE | Admit: 2023-04-19 | Discharge: 2023-04-19 | Disposition: A | Discharge status: Home / Self Care | Payer: Medicare PPO | Source: Ambulatory Visit | Attending: Family Medicine | Admitting: Family Medicine

## 2023-04-19 DIAGNOSIS — R928 Other abnormal and inconclusive findings on diagnostic imaging of breast: Secondary | ICD-10-CM

## 2023-04-19 DIAGNOSIS — R921 Mammographic calcification found on diagnostic imaging of breast: Secondary | ICD-10-CM | POA: Diagnosis not present

## 2023-04-20 ENCOUNTER — Other Ambulatory Visit: Payer: Self-pay | Admitting: Family Medicine

## 2023-04-20 DIAGNOSIS — R921 Mammographic calcification found on diagnostic imaging of breast: Secondary | ICD-10-CM

## 2023-06-11 IMAGING — MG MM DIGITAL SCREENING BILAT W/ TOMO AND CAD
6 of 12 series · 6 of 36 positions shown · non-contrast
Comparison: Previous exam(s).

CLINICAL DATA: Screening.

EXAM:
DIGITAL SCREENING BILATERAL MAMMOGRAM WITH TOMOSYNTHESIS AND CAD
TECHNIQUE: Bilateral screening digital craniocaudal and mediolateral oblique
mammograms were obtained. Bilateral screening digital breast
tomosynthesis was performed. The images were evaluated with
computer-aided detection.

[L XCCL synth-2D]
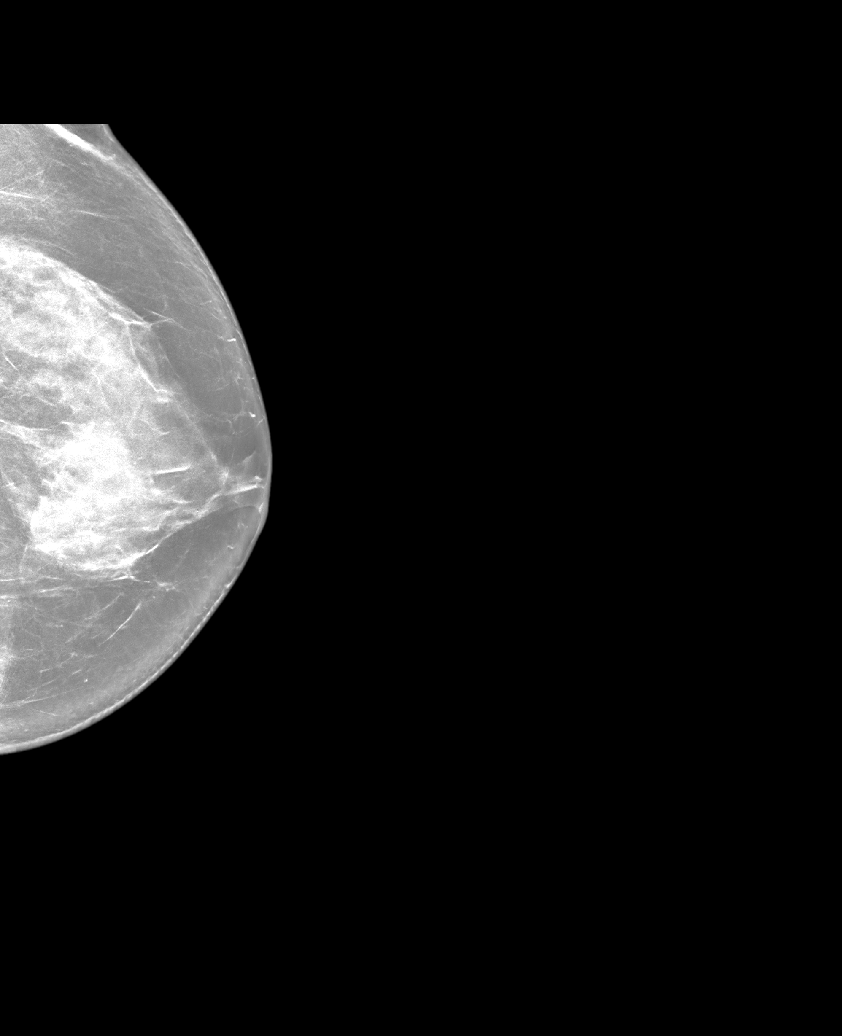

[L MLO synth-2D]
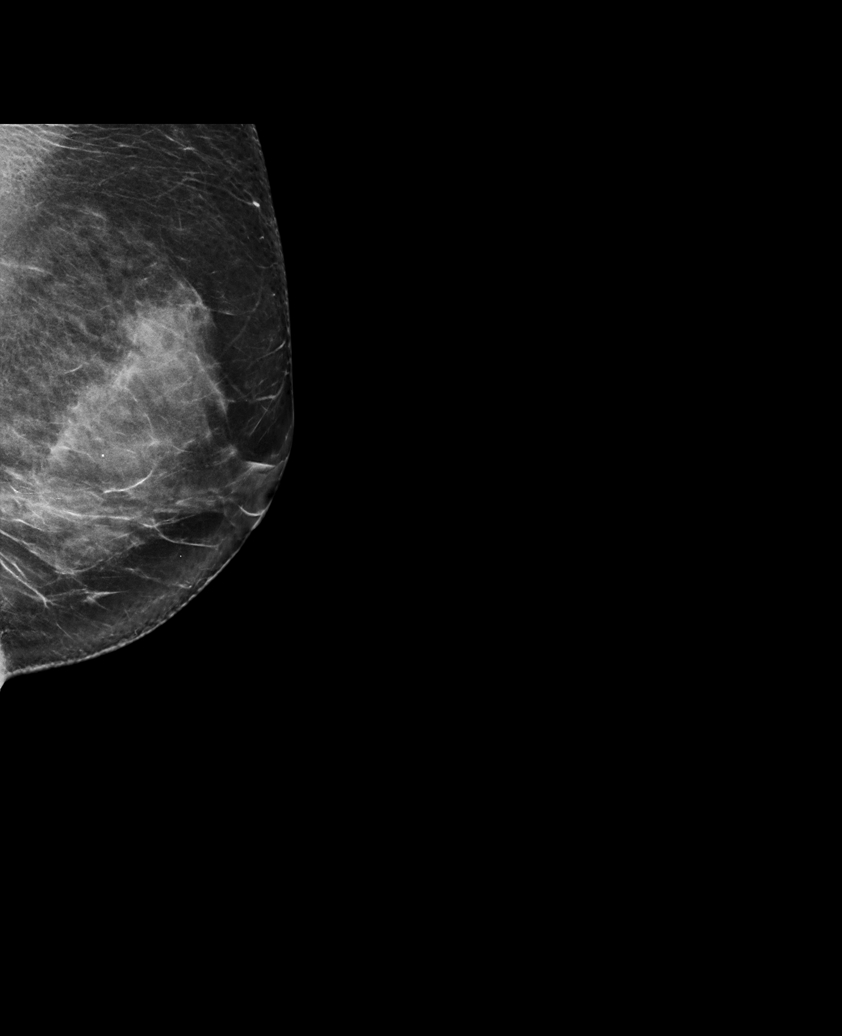

[L CC synth-2D]
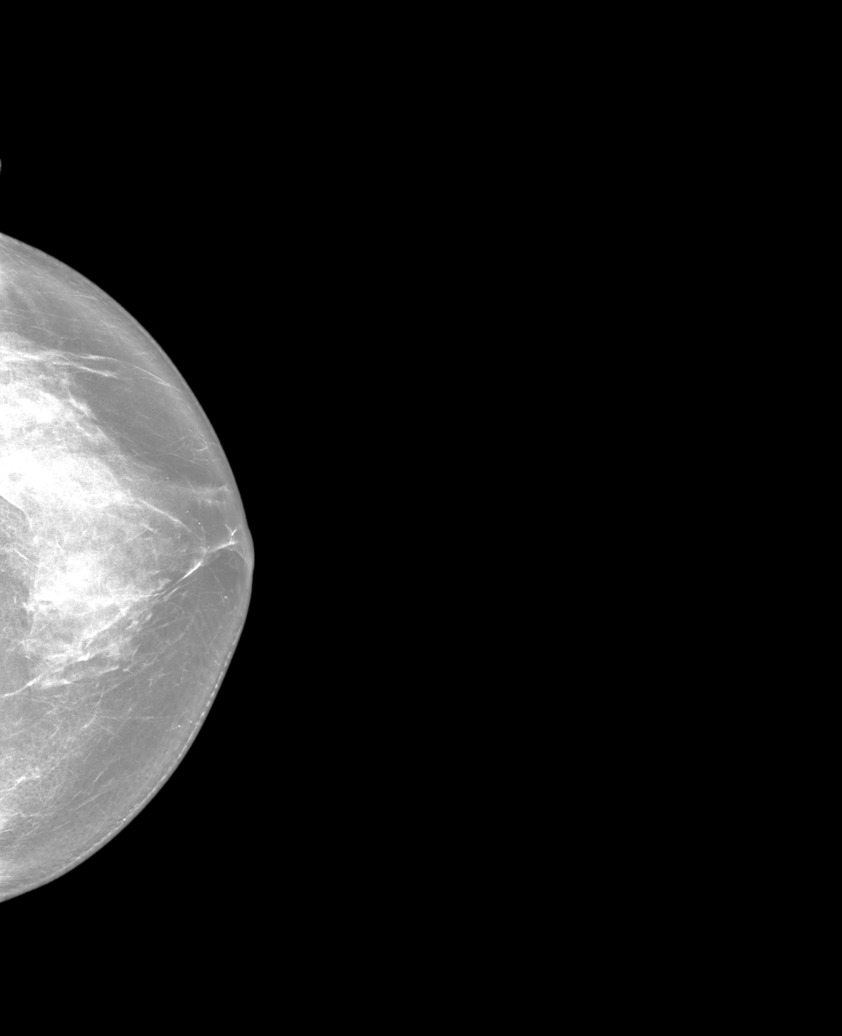

[R XCCL synth-2D]
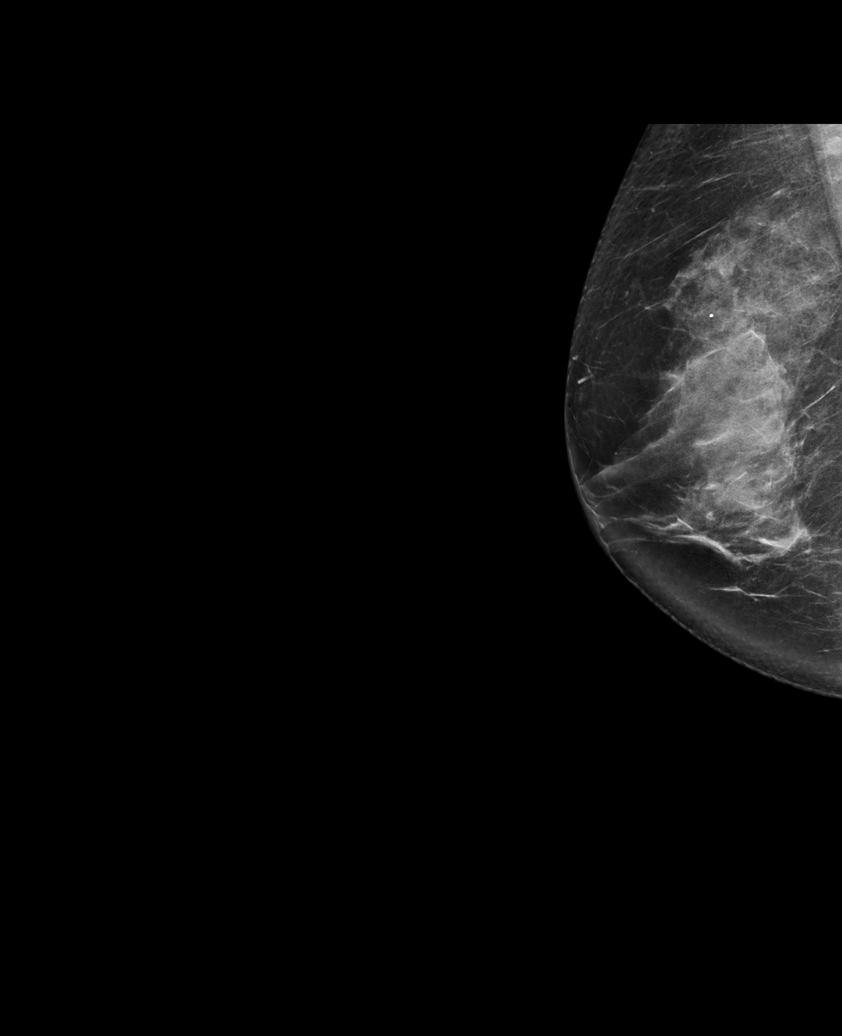

[R CC synth-2D]
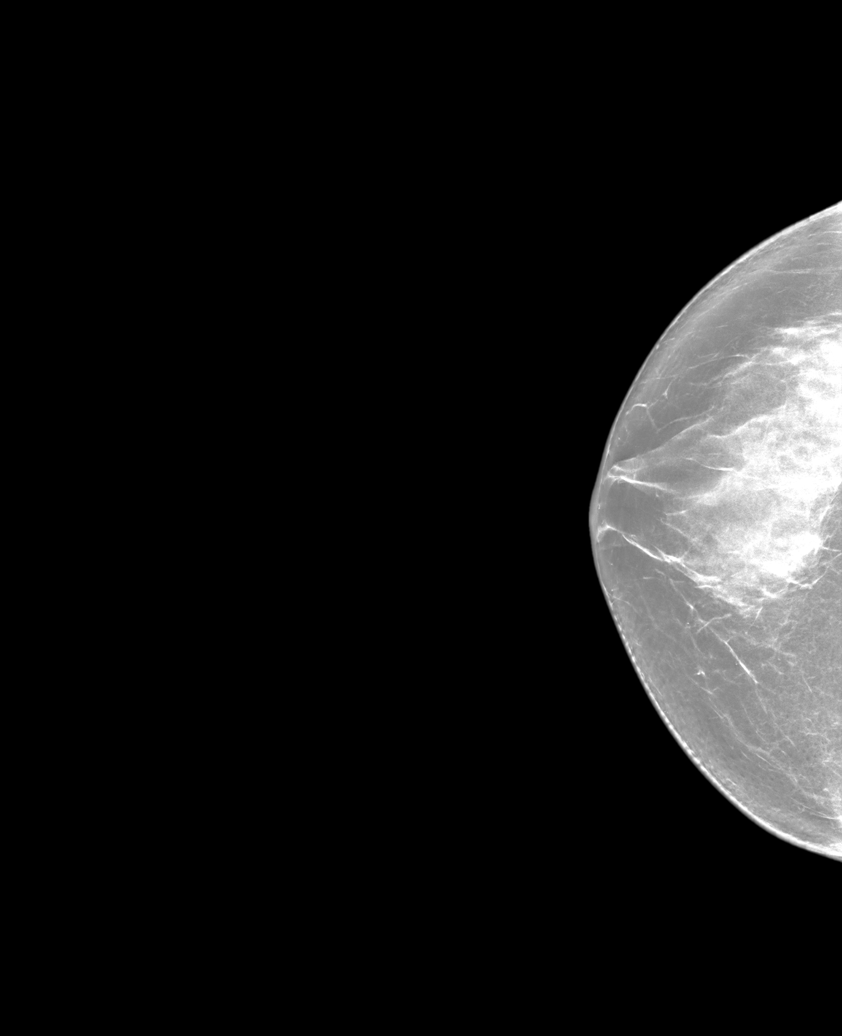

[R MLO synth-2D]
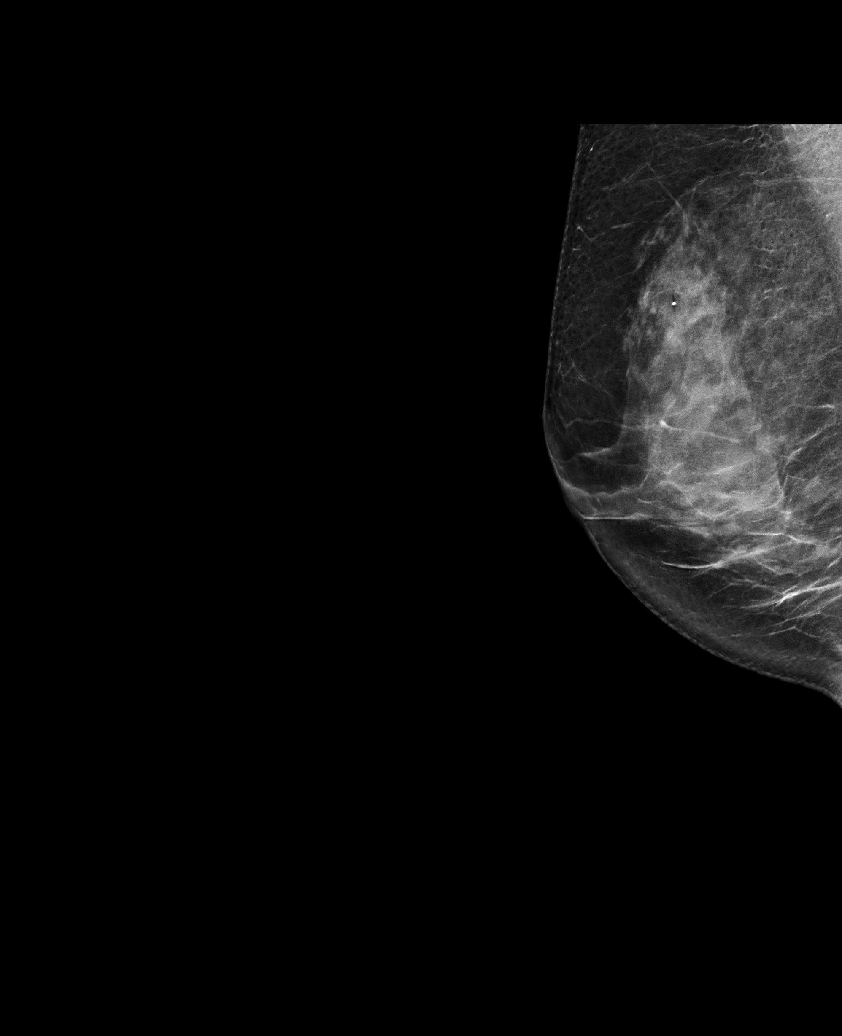

[6 of 36 positions shown; findings below may reference images not displayed]

ACR Breast Density Category c: The breast tissue is heterogeneously
dense, which may obscure small masses.
FINDINGS: There are no findings suspicious for malignancy.
IMPRESSION: No mammographic evidence of malignancy. A result letter of this
screening mammogram will be mailed directly to the patient.

RECOMMENDATION:
Screening mammogram in one year. (Code:Q3-W-BC3)

BI-RADS CATEGORY  1: Negative.

## 2023-09-07 ENCOUNTER — Ambulatory Visit: Payer: Medicare PPO

## 2023-09-07 VITALS — Ht 62.0 in | Wt 143.0 lb

## 2023-09-07 DIAGNOSIS — Z0000 Encounter for general adult medical examination without abnormal findings: Secondary | ICD-10-CM

## 2023-09-07 NOTE — Progress Notes (Signed)
 Subjective:   [name] is a 78 y.o. who presents for a Medicare Wellness preventive visit.  As a reminder, Annual Wellness Visits don't include a physical exam, and some assessments may be limited, especially if this visit is performed virtually. We may recommend an in-person follow-up visit with your provider if needed.  Visit Complete: Virtual I connected with  [name] on 09/07/23 by a audio enabled telemedicine application and verified that I am speaking with the correct person using two identifiers.  Patient Location: Home  Provider Location: Home Office  I discussed the limitations of evaluation and management by telemedicine. The patient expressed understanding and agreed to proceed.  Vital Signs: Because this visit was a virtual/telehealth visit, some criteria may be missing or patient reported. Any vitals not documented were not able to be obtained and vitals that have been documented are patient reported.  VideoDeclined- This patient declined Librarian, academic. Therefore the visit was completed with audio only.  Persons Participating in Visit: Patient.  AWV Questionnaire: No: Patient Medicare AWV questionnaire was not completed prior to this visit.  Cardiac Risk Factors include: advanced age (>74men, >9 women);dyslipidemia;hypertension     Objective:    Today's Vitals   09/07/23 1007  Weight: 143 lb (64.9 kg)  Height: 5' 2 (1.575 m)   Body mass index is 26.16 kg/m.     09/07/2023   10:10 AM 12/13/2022   10:19 AM 09/01/2022    9:47 AM 08/24/2021   10:23 AM 08/06/2020    2:39 PM 04/12/2019    9:53 AM 02/28/2017    8:42 AM  Advanced Directives  Does Patient Have a Medical Advance Directive? Yes Yes Yes Yes Yes Yes Yes   Type of Estate agent of [place];Living will  Healthcare Power of [place];Living will Healthcare Power of Attorney  Living will;Healthcare Power of Attorney Living will;Healthcare Power of  Attorney  Does patient want to make changes to medical advance directive?     Yes (MAU/Ambulatory/Procedural Areas - Information given) No - Patient declined No - Patient declined   Copy of Healthcare Power of Attorney in Chart? No - copy requested  No - copy requested No - copy requested  No - copy requested No - copy requested      Data saved with a previous flowsheet row definition    Current Medications (verified) Outpatient Encounter Medications as of 09/07/2023  Medication Sig   aspirin 81 MG tablet Take 81 mg by mouth daily.   cholecalciferol  (VITAMIN D3) 25 MCG (1000 UT) tablet Take by mouth daily.   hydrochlorothiazide  (HYDRODIURIL ) 25 MG tablet TAKE 1 TABLET EVERY DAY   meclizine  (ANTIVERT ) 25 MG tablet Take 1 tablet (25 mg total) by mouth 3 (three) times daily as needed for dizziness.   metoprolol  succinate (TOPROL -XL) 25 MG 24 hr tablet TAKE 1 TABLET EVERY DAY   ondansetron  (ZOFRAN -ODT) 4 MG disintegrating tablet Take 1 tablet (4 mg total) by mouth every 8 (eight) hours as needed for nausea or vomiting.   rosuvastatin  (CRESTOR ) 20 MG tablet TAKE 1 TABLET EVERY DAY   Coenzyme Q10 (COQ10) 100 MG CAPS Take 100 mg by mouth daily. (Patient not taking: Reported on 09/07/2023)   Multiple Vitamin (MULTIVITAMIN) tablet Take 1 tablet by mouth daily. (Patient not taking: Reported on 09/07/2023)   No facility-administered encounter medications on file as of 09/07/2023.    Allergies (verified) Patient has no known allergies.   History: Past Medical History:  Diagnosis  Date   Arthritis    Chest pain at rest 01/14/2013   Complication of anesthesia    Fracture of tibia and fibula 01/27/2015   GERD (gastroesophageal reflux disease)    Hashimoto's thyroiditis 11/17/2017   Lab Results Component Value Date  TSH 2.17 11/17/2017  FREET4 0.87 11/17/2017   Lab Results Component Value Date  TSH 2.17 11/17/2017  TSH 2.72 09/08/2017  TSH 5.20 (H) 11/21/2016  TSH 3.813 01/29/2015  FREET4 0.87 11/17/2017   FREET4 0.83 11/21/2016     High cholesterol    Hypertension    PONV (postoperative nausea and vomiting), Scopalamine controls    Seasonal allergies    Subclinical hypothyroidism 11/27/2016   Syndesmotic disruption of right ankle 03/06/2015   Vitamin D  deficiency 03/06/2015   Past Surgical History:  Procedure Laterality Date   BUNIONECTOMY Bilateral    COLONOSCOPY     HARDWARE REMOVAL Right 12/10/2015   Procedure: HARDWARE REMOVAL RIGHT ANKLE;  Surgeon: Ozell Bruch, MD;  Location: [facility] OR;  Service: Orthopedics;  Laterality: Right;   MYOMECTOMY     ORIF TIBIA FRACTURE Right 01/29/2015   Procedure: OPEN REDUCTION INTERNAL FIXATION (ORIF) RIGHT TIBIA  AND FIBULA FRACTURES;  Surgeon: Ozell Bruch, MD;  Location: [facility] OR;  Service: Orthopedics;  Laterality: Right;   Family History  Problem Relation Age of Onset   CVA Mother    Heart disease Father    Diabetes Father    Heart attack Sister    CVA Sister    CVA Sister    CVA Sister    Breast cancer Sister    Social History   Socioeconomic History   Marital status: Married    Spouse name: Not on file   Number of children: Not on file   Years of education: Not on file   Highest education level: Bachelor's degree (e.g., BA, AB, BS)  Occupational History   Occupation: Retired Runner, broadcasting/film/video  Tobacco Use   Smoking status: Never   Smokeless tobacco: Never  Vaping Use   Vaping status: Never Used  Substance and Sexual Activity   Alcohol use: Yes    Alcohol/week: 3.0 - 4.0 standard drinks of alcohol    Types: 3 - 4 Glasses of wine per week   Drug use: No   Sexual activity: Yes    Birth control/protection: Post-menopausal  Other Topics Concern   Not on file  Social History Narrative   Not on file   Social Drivers of Health   Financial Resource Strain: Low Risk  (09/07/2023)   Overall Financial Resource Strain (CARDIA)    Difficulty of Paying Living Expenses: Not hard at all  Food Insecurity: No Food Insecurity (09/07/2023)   Hunger  Vital Sign    Worried About Running Out of Food in the Last Year: Never true    Ran Out of Food in the Last Year: Never true  Transportation Needs: No Transportation Needs (09/07/2023)   PRAPARE - Administrator, Civil Service (Medical): No    Lack of Transportation (Non-Medical): No  Physical Activity: Sufficiently Active (09/07/2023)   Exercise Vital Sign    Days of Exercise per Week: 6 days    Minutes of Exercise per Session: 90 min  Stress: No Stress Concern Present (09/07/2023)   [employer] of Occupational Health - Occupational Stress Questionnaire    Feeling of Stress: Only a little  Social Connections: Moderately Integrated (09/07/2023)   Social Connection and Isolation Panel    Frequency of Communication with Friends and  Family: More than three times a week    Frequency of Social Gatherings with Friends and Family: More than three times a week    Attends Religious Services: Never    Database administrator or Organizations: Yes    Attends Engineer, structural: 1 to 4 times per year    Marital Status: Married    Tobacco Counseling Counseling given: Not Answered    Clinical Intake:  Pre-visit preparation completed: Yes  Pain : No/denies pain     BMI - recorded: 26.16 Nutritional Status: BMI 25 -29 Overweight Nutritional Risks: None Diabetes: No  Lab Results  Component Value Date   HGBA1C 5.9 12/05/2022   HGBA1C 6.1 04/26/2021   HGBA1C 5.9 02/24/2020     How often do you need to have someone help you when you read instructions, pamphlets, or other written materials from your doctor or pharmacy?: 1 - Never  Interpreter Needed?: No  Information entered by :: Toina Livian Vanderbeck, LPN   Activities of Daily Living     09/07/2023   10:08 AM  In your present state of health, do you have any difficulty performing the following activities:  Hearing? 1  Comment right ear hearing loss  Vision? 0  Difficulty concentrating or making decisions?  0  Walking or climbing stairs? 0  Dressing or bathing? 0  Doing errands, shopping? 0  Preparing Food and eating ? N  Using the Toilet? N  In the past six months, have you accidently leaked urine? N  Do you have problems with loss of bowel control? N  Managing your Medications? N  Managing your Finances? N  Housekeeping or managing your Housekeeping? N    Patient Care Team: Jodie Lavern CROME, MD as PCP - General (Family Medicine) Trixie File, MD as Consulting Physician (Internal Medicine) Claudene Victory ORN, MD (Inactive) as Consulting Physician (Cardiology)  I have updated your Care Teams any recent Medical Services you may have received from other providers in the past year.     Assessment:   This is a routine wellness examination for Tarra.  Hearing/Vision screen Hearing Screening - Comments:: Right ear hearing loss  Vision Screening - Comments:: Wears rx glasses - up to date with routine eye exams with Dr [facility]    Goals Addressed             This Visit's Progress    Patient Stated       Weight loss       Depression Screen     09/07/2023   10:12 AM 12/05/2022    1:03 PM 10/18/2022    8:03 AM 09/01/2022    9:43 AM 10/27/2021    9:25 AM 08/24/2021   10:21 AM 04/26/2021    9:05 AM  PHQ 2/9 Scores  PHQ - 2 Score 0 0 0 0 0 0 0    Fall Risk     09/07/2023   10:14 AM 12/05/2022    1:03 PM 10/18/2022    8:02 AM 09/01/2022    9:47 AM 10/27/2021    9:24 AM  Fall Risk   Falls in the past year? 0 0 0 0 0  Number falls in past yr: 0 0 0 0 0  Injury with Fall? 0 0 0 0 0  Risk for fall due to : No Fall Risks No Fall Risks No Fall Risks Impaired vision No Fall Risks  Follow up Falls prevention discussed Falls evaluation completed Falls evaluation completed Falls prevention discussed  Falls evaluation completed      Data saved with a previous flowsheet row definition    MEDICARE RISK AT HOME:  Medicare Risk at Home Any stairs in or around the home?:  Yes If so, are there any without handrails?: No Home free of loose throw rugs in walkways, pet beds, electrical cords, etc?: Yes Adequate lighting in your home to reduce risk of falls?: Yes Life alert?: No Use of a cane, walker or w/c?: No Grab bars in the bathroom?: Yes Shower chair or bench in shower?: Yes Elevated toilet seat or a handicapped toilet?: No  TIMED UP AND GO:  Was the test performed?  No  Cognitive Function: 6CIT completed        09/07/2023   10:15 AM 09/01/2022    9:47 AM 08/24/2021   10:26 AM 08/06/2020    2:45 PM 04/12/2019    9:54 AM  6CIT Screen  What Year? 0 points 0 points 0 points 0 points 0 points  What month? 0 points 0 points 0 points 0 points 0 points  What time? 0 points 0 points 0 points 0 points 0 points  Count back from 20 0 points 0 points 0 points 0 points 0 points  Months in reverse 0 points 0 points 0 points 0 points 0 points  Repeat phrase 0 points 0 points 0 points 0 points 0 points  Total Score 0 points 0 points 0 points 0 points 0 points    Immunizations Immunization History  Administered Date(s) Administered   Fluad Quad(high Dose 65+) 11/19/2018, 11/27/2019, 11/19/2020, 10/27/2021   Fluad Trivalent(High Dose 65+) 10/18/2022   Influenza, High Dose Seasonal PF 11/21/2016, 11/17/2017   Influenza,inj,Quad PF,6+ Mos 01/05/2016   PFIZER Comirnaty(Gray Top)Covid-19 Tri-Sucrose Vaccine 09/06/2020   PFIZER(Purple Top)SARS-COV-2 Vaccination 03/13/2019, 04/03/2019, 12/19/2019   Pfizer Covid-19 Vaccine Bivalent Booster 47yrs & up 01/21/2021   Pfizer(Comirnaty)Fall Seasonal Vaccine 12 years and older 02/15/2022   Pneumococcal Conjugate-13 12/26/2014   Pneumococcal Polysaccharide-23 12/28/2012   Tdap 11/20/2013   Zoster Recombinant(Shingrix) 12/21/2017, 02/26/2018    Screening Tests Health Maintenance  Topic Date Due   COVID-19 Vaccine (7 - 2024-25 season) 10/16/2022   INFLUENZA VACCINE  09/15/2023   DTaP/Tdap/Td (2 - Td or Tdap)  11/21/2023   MAMMOGRAM  04/18/2024   Medicare Annual Wellness (AWV)  09/06/2024   DEXA SCAN  03/09/2025   Pneumococcal Vaccine: 50+ Years  Completed   Hepatitis C Screening  Completed   Zoster Vaccines- Shingrix  Completed   Hepatitis B Vaccines  Aged Out   HPV VACCINES  Aged Out   Meningococcal B Vaccine  Aged Out   Colonoscopy  Discontinued   Fecal DNA (Cologuard)  Discontinued    Health Maintenance  Health Maintenance Due  Topic Date Due   COVID-19 Vaccine (7 - 2024-25 season) 10/16/2022   Health Maintenance Items Addressed: See Nurse Notes at the end of this note  Additional Screening:  Vision Screening: Recommended annual ophthalmology exams for early detection of glaucoma and other disorders of the eye. Would you like a referral to an eye doctor? No    Dental Screening: Recommended annual dental exams for proper oral hygiene  Community Resource Referral / Chronic Care Management: CRR required this visit?  No   CCM required this visit?  No   Plan:    I have personally reviewed and noted the following in the patient's chart:   Medical and social history Use of alcohol, tobacco or illicit drugs  Current medications and  supplements including opioid prescriptions. Patient is not currently taking opioid prescriptions. Functional ability and status Nutritional status Physical activity Advanced directives List of other physicians Hospitalizations, surgeries, and ER visits in previous 12 months Vitals Screenings to include cognitive, depression, and falls Referrals and appointments  In addition, I have reviewed and discussed with patient certain preventive protocols, quality metrics, and best practice recommendations. A written personalized care plan for preventive services as well as general preventive health recommendations were provided to patient.   Ellouise VEAR Haws, LPN   [id number]/75/7974   After Visit Summary: (MyChart) Due to this being a telephonic visit, the  after visit summary with patients personalized plan was offered to patient via MyChart   Notes: Nothing significant to report at this time.

## 2023-09-07 NOTE — Patient Instructions (Signed)
 [name] , Thank you for taking time out of your busy schedule to complete your Annual Wellness Visit with me. I enjoyed our conversation and look forward to speaking with you again next year. I, as well as your care team,  appreciate your ongoing commitment to your health goals. Please review the following plan we discussed and let me know if I can assist you in the future. Your Game plan/ To Do List    Referrals: If you haven't heard from the office you've been referred to, please reach out to them at the phone provided.   Follow up Visits: Next Medicare AWV with our clinical staff: 09/12/24   Have you seen your provider in the last 6 months (3 months if uncontrolled diabetes)? No Next Office Visit with your provider: 12/07/23  Clinician Recommendations:  Aim for 30 minutes of exercise or brisk walking, 6-8 glasses of water, and 5 servings of fruits and vegetables each day.        This is a list of the screening recommended for you and due dates:  Health Maintenance  Topic Date Due   COVID-19 Vaccine (7 - 2024-25 season) 10/16/2022   Flu Shot  09/15/2023   DTaP/Tdap/Td vaccine (2 - Td or Tdap) 11/21/2023   Mammogram  04/18/2024   Medicare Annual Wellness Visit  09/06/2024   DEXA scan (bone density measurement)  03/09/2025   Pneumococcal Vaccine for age over 67  Completed   Hepatitis C Screening  Completed   Zoster (Shingles) Vaccine  Completed   Hepatitis B Vaccine  Aged Out   HPV Vaccine  Aged Out   Meningitis B Vaccine  Aged Out   Colon Cancer Screening  Discontinued   Cologuard (Stool DNA test)  Discontinued    Advanced directives: (Copy Requested) Please bring a copy of your health care power of attorney and living will to the office to be added to your chart at your convenience. You can mail to [facility] [address] [place], [place] [address] or email to [email or web address] Advance Care Planning is important because it:  [x]  Makes sure you  receive the medical care that is consistent with your values, goals, and preferences  [x]  It provides guidance to your family and loved ones and reduces their decisional burden about whether or not they are making the right decisions based on your wishes.  Follow the link provided in your after visit summary or read over the paperwork we have mailed to you to help you started getting your Advance Directives in place. If you need assistance in completing these, please reach out to us  so that we can help you!  See attachments for Preventive Care and Fall Prevention Tips.

## 2023-10-23 ENCOUNTER — Ambulatory Visit
Admission: RE | Admit: 2023-10-23 | Discharge: 2023-10-23 | Disposition: A | Discharge status: Home / Self Care | Source: Ambulatory Visit | Attending: Family Medicine | Admitting: Family Medicine

## 2023-10-23 DIAGNOSIS — R928 Other abnormal and inconclusive findings on diagnostic imaging of breast: Secondary | ICD-10-CM | POA: Diagnosis not present

## 2023-10-23 DIAGNOSIS — R921 Mammographic calcification found on diagnostic imaging of breast: Secondary | ICD-10-CM

## 2023-12-05 ENCOUNTER — Encounter: Payer: Self-pay | Admitting: Pharmacist

## 2023-12-05 NOTE — Progress Notes (Signed)
 Pharmacy Quality Measure Review  This patient is appearing on a report for being at risk of failing the adherence measure for cholesterol (statin) medications this calendar year.   Medication: rosuvastatin  Last fill date: 08/20/2023 for 90 day supply  Reviewed recent refill history in Dr Annemarie database. Actual last refill date was 11/15/2023 for 90 day supply. Patient has 1 refill remaining. Next appointment with PCP is 12/15/2023.    Insurance report was not up to date. No action needed at this time.   Madelin Ray, PharmD Clinical Pharmacist [facility]  Population Health [phone]

## 2023-12-07 ENCOUNTER — Encounter: Payer: Medicare PPO | Admitting: Family Medicine

## 2023-12-15 ENCOUNTER — Encounter: Payer: Self-pay | Admitting: Family Medicine

## 2023-12-15 ENCOUNTER — Ambulatory Visit (INDEPENDENT_AMBULATORY_CARE_PROVIDER_SITE_OTHER): Admitting: Family Medicine

## 2023-12-15 VITALS — BP 138/82 | HR 53 | Temp 97.7°F | Ht 62.0 in | Wt 143.0 lb

## 2023-12-15 DIAGNOSIS — Z23 Encounter for immunization: Secondary | ICD-10-CM | POA: Diagnosis not present

## 2023-12-15 DIAGNOSIS — Z0000 Encounter for general adult medical examination without abnormal findings: Secondary | ICD-10-CM | POA: Diagnosis not present

## 2023-12-15 DIAGNOSIS — D751 Secondary polycythemia: Secondary | ICD-10-CM | POA: Diagnosis not present

## 2023-12-15 DIAGNOSIS — R7301 Impaired fasting glucose: Secondary | ICD-10-CM

## 2023-12-15 DIAGNOSIS — I10 Essential (primary) hypertension: Secondary | ICD-10-CM | POA: Diagnosis not present

## 2023-12-15 DIAGNOSIS — E559 Vitamin D deficiency, unspecified: Secondary | ICD-10-CM

## 2023-12-15 DIAGNOSIS — I4719 Other supraventricular tachycardia: Secondary | ICD-10-CM

## 2023-12-15 DIAGNOSIS — Z0001 Encounter for general adult medical examination with abnormal findings: Secondary | ICD-10-CM

## 2023-12-15 DIAGNOSIS — E782 Mixed hyperlipidemia: Secondary | ICD-10-CM | POA: Diagnosis not present

## 2023-12-15 DIAGNOSIS — Z8639 Personal history of other endocrine, nutritional and metabolic disease: Secondary | ICD-10-CM

## 2023-12-15 LAB — CBC WITH DIFFERENTIAL/PLATELET
Basophils Absolute: 0 K/uL (ref 0.0–0.1)
Basophils Relative: 1.2 % (ref 0.0–3.0)
Eosinophils Absolute: 0.1 K/uL (ref 0.0–0.7)
Eosinophils Relative: 1.8 % (ref 0.0–5.0)
HCT: 47.2 % — ABNORMAL HIGH (ref 36.0–46.0)
Hemoglobin: 16.1 g/dL — ABNORMAL HIGH (ref 12.0–15.0)
Lymphocytes Relative: 29.5 % (ref 12.0–46.0)
Lymphs Abs: 1.1 K/uL (ref 0.7–4.0)
MCHC: 34.1 g/dL (ref 30.0–36.0)
MCV: 91.9 fl (ref 78.0–100.0)
Monocytes Absolute: 0.3 K/uL (ref 0.1–1.0)
Monocytes Relative: 8.1 % (ref 3.0–12.0)
Neutro Abs: 2.2 K/uL (ref 1.4–7.7)
Neutrophils Relative %: 59.4 % (ref 43.0–77.0)
Platelets: 215 K/uL (ref 150.0–400.0)
RBC: 5.13 Mil/uL — ABNORMAL HIGH (ref 3.87–5.11)
RDW: 13.2 % (ref 11.5–15.5)
WBC: 3.8 K/uL — ABNORMAL LOW (ref 4.0–10.5)

## 2023-12-15 LAB — COMPREHENSIVE METABOLIC PANEL WITH GFR
ALT: 28 U/L (ref 0–35)
AST: 26 U/L (ref 0–37)
Albumin: 4.5 g/dL (ref 3.5–5.2)
Alkaline Phosphatase: 52 U/L (ref 39–117)
BUN: 15 mg/dL (ref 6–23)
CO2: 30 meq/L (ref 19–32)
Calcium: 9.5 mg/dL (ref 8.4–10.5)
Chloride: 101 meq/L (ref 96–112)
Creatinine, Ser: 0.83 mg/dL (ref 0.40–1.20)
GFR: 68.44 mL/min (ref >60.00)
Glucose, Bld: 95 mg/dL (ref 70–99)
Potassium: 3.9 meq/L (ref 3.5–5.1)
Sodium: 141 meq/L (ref 135–145)
Total Bilirubin: 0.7 mg/dL (ref 0.2–1.2)
Total Protein: 6.8 g/dL (ref 6.0–8.3)

## 2023-12-15 LAB — LIPID PANEL
Cholesterol: 191 mg/dL (ref 0–200)
HDL: 89.7 mg/dL (ref >39.00)
LDL Cholesterol: 89 mg/dL (ref 0–99)
NonHDL: 101.62
Total CHOL/HDL Ratio: 2
Triglycerides: 61 mg/dL (ref 0.0–149.0)
VLDL: 12.2 mg/dL (ref 0.0–40.0)

## 2023-12-15 LAB — TSH: TSH: 2.41 u[IU]/mL (ref 0.35–5.50)

## 2023-12-15 LAB — HEMOGLOBIN A1C: Hgb A1c MFr Bld: 6 % (ref 4.6–6.5)

## 2023-12-15 LAB — VITAMIN D 25 HYDROXY (VIT D DEFICIENCY, FRACTURES): VITD: 48.99 ng/mL (ref 30.00–100.00)

## 2023-12-15 MED ORDER — ONDANSETRON 4 MG PO TBDP: 4.0000 mg | ORAL_TABLET | Freq: Three times a day (TID) | ORAL | 0 refills | Status: AC | PRN

## 2023-12-15 MED ORDER — HYDROCHLOROTHIAZIDE 25 MG PO TABS: 25.0000 mg | ORAL_TABLET | Freq: Every day | ORAL | 3 refills | Status: DC

## 2023-12-15 NOTE — Progress Notes (Signed)
 Subjective  Chief Complaint  Patient presents with   Annual Exam    Pt here for Annual Exam and is currently fasting    Hypertension    HPI: [name] is a 78 y.o. female who presents to [facility] [place] today for a Female Wellness Visit. She also has the concerns and/or needs as listed above in the chief complaint. These will be addressed in addition to the Health Maintenance Visit.   Wellness Visit: annual visit with health maintenance review and exam  HM: screens are all current.  Remains very active, exercises regularly, goes to the gym, swims and hikes.  Feels well overall.  Flu shot today.  Other immunizations up-to-date.  Chronic disease f/u and/or acute problem visit: (deemed necessary to be done in addition to the wellness visit): Discussed the use of AI scribe software for clinical note transcription with the patient, who gave verbal consent to proceed.  History of Present Illness [name] is a 78 year old female with hypertension and atrial rhythm issues who presents for blood pressure management and medication review.  Blood pressure management - Blood pressure generally well-controlled at home, with readings typically in the 120s/70s - Elevated blood pressure during clinic visits and after exercise, such as swimming - Recent episode of elevated blood pressure after swimming, which decreased later in the day - Currently taking low dose metoprolol  and hydrochlorothiazide  25 daily.  Tolerates well. - No side effects from metoprolol , including no sluggishness or difficulty with exercise - She was averse to metoprolol  50 mg daily in the past, felt sluggishness or lightheadedness with walking. - Denies chest pain shortness of breath.  History of atrial tachycardia - History of atrial rhythm disturbances - Metoprolol  used for rhythm control - Occasional palpitations described as a 'little flutter', occurs rarely now - Palpitations are infrequent  and less noticeable than previously  Hyperlipidemia on statin well-controlled, fasting for recheck today.  Tolerates well  History of impaired fasting glucose without symptoms of hyperglycemia.  Eats a healthy diet.  Weight is stable.  High normal hemoglobin negative workup in the past.  Will monitor  Takes vitamin D  regularly.  Due for recheck normal bone density scan in the past.    Assessment  1. Encounter for well adult exam with abnormal findings   2. Need for influenza vaccination   3. Essential hypertension   4. Ectopic atrial tachycardia   5. History of Hashimoto thyroiditis   6. IFG (impaired fasting glucose)   7. Mixed hyperlipidemia   8. Polycythemia   9. Vitamin D  deficiency      Plan  Female Wellness Visit: Age appropriate Health Maintenance and Prevention measures were discussed with patient. Included topics are cancer screening recommendations, ways to keep healthy (see AVS) including dietary and exercise recommendations, regular eye and dental care, use of seat belts, and avoidance of moderate alcohol use and tobacco use.  BMI: discussed patient's BMI and encouraged positive lifestyle modifications to help get to or maintain a target BMI. HM needs and immunizations were addressed and ordered. See below for orders. See HM and immunization section for updates.  Flu shot today Routine labs and screening tests ordered including cmp, cbc and lipids where appropriate. Discussed recommendations regarding Vit D and calcium  supplementation (see AVS)  Chronic disease management visit and/or acute problem visit: Assessment and Plan Assessment & Plan Adult Wellness Visit Routine wellness visit with emphasis on maintaining health through exercise and lifestyle modifications. Discussion on  health care directives and living will, with advice to notarize forms and submit them to the chart. - Continue regular exercise and healthy lifestyle - Notarize and submit health care  directive forms to the chart  Essential hypertension Blood pressure generally well-controlled at home with occasional elevations post-exercise. Current regimen includes low-dose metoprolol , effective without side effects. Emphasis on maintaining control during exercise to prevent hypertensive response. - Monitor blood pressure at home, especially post-exercise - Report persistent high readings for potential medication adjustment - Discussed goal of 120s over 70s or less.  Would add ACE inhibitor to HCTZ if needed in the future.  Continue metoprolol  for atrial tachycardia management.  This is stable  Hyperlipidemia on statin for recheck today. Monitoring vitamin D  levels Recheck A1c given history of impaired fasting glucose.  Hopefully will be stable. History of Hashimoto's.  Monitoring TSH annually.  No clinical symptoms at this time.  Encounter for immunization Flu shot administered today as part of routine immunization schedule. - Administered flu shot    Follow up: 1 year for complete physical Orders Placed This Encounter  Procedures   Flu vaccine HIGH DOSE PF(Fluzone Trivalent)   VITAMIN D  25 Hydroxy (Vit-D Deficiency, Fractures)   CBC with Differential/Platelet   Comprehensive metabolic panel with GFR   Lipid panel   TSH   Hemoglobin A1c   Meds ordered this encounter  Medications   hydrochlorothiazide  (HYDRODIURIL ) 25 MG tablet    Sig: Take 1 tablet (25 mg total) by mouth daily.    Dispense:  90 tablet    Refill:  3   ondansetron  (ZOFRAN -ODT) 4 MG disintegrating tablet    Sig: Take 1 tablet (4 mg total) by mouth every 8 (eight) hours as needed for nausea or vomiting.    Dispense:  20 tablet    Refill:  0      Body mass index is 26.16 kg/m. Wt Readings from Last 3 Encounters:  12/15/23 143 lb (64.9 kg)  09/07/23 143 lb (64.9 kg)  12/05/22 143 lb 3.2 oz (65 kg)     Patient Active Problem List   Diagnosis Date Noted   Screening for colorectal cancer 10/27/2021     Negative cologuard 2018 and 2023    Osteoporosis screening 03/11/2020    Normal Dexa 2017 and 2022    Polycythemia 02/25/2019    2016 noted, then improved until 2017: persistently elevated hgb since. Last 16.5; evaluated by hematology 2021: neg JAK2. High Normal hgb, no further eval or treatment needed.     IFG (impaired fasting glucose) 02/22/2019   Ectopic atrial tachycardia 02/21/2019   History of Hashimoto thyroiditis 11/17/2017           Seasonal allergies    GERD (gastroesophageal reflux disease)    Vitamin D  deficiency 03/06/2015   Mixed hyperlipidemia    Essential hypertension 01/14/2013   Health Maintenance  Topic Date Due   Influenza Vaccine  09/15/2023   DTaP/Tdap/Td (2 - Td or Tdap) 11/21/2023   COVID-19 Vaccine (7 - 2025-26 season) 12/31/2023 (Originally 10/16/2023)   Mammogram  04/18/2024   Medicare Annual Wellness (AWV)  09/06/2024   DEXA SCAN  03/09/2025   Pneumococcal Vaccine: 50+ Years  Completed   Hepatitis C Screening  Completed   Zoster Vaccines- Shingrix  Completed   Meningococcal B Vaccine  Aged Out   Colonoscopy  Discontinued   Fecal DNA (Cologuard)  Discontinued   Immunization History  Administered Date(s) Administered   Fluad Quad(high Dose 65+) 11/19/2018, 11/27/2019, 11/19/2020, 10/27/2021  Fluad Trivalent(High Dose 65+) 10/18/2022   INFLUENZA, HIGH DOSE SEASONAL PF 11/21/2016, 11/17/2017   Influenza,inj,Quad PF,6+ Mos 01/05/2016   PFIZER Comirnaty(Gray Top)Covid-19 Tri-Sucrose Vaccine 09/06/2020   PFIZER(Purple Top)SARS-COV-2 Vaccination 03/13/2019, 04/03/2019, 12/19/2019   Pfizer Covid-19 Vaccine Bivalent Booster 75yrs & up 01/21/2021   Pfizer(Comirnaty)Fall Seasonal Vaccine 12 years and older 02/15/2022   Pneumococcal Conjugate-13 12/26/2014   Pneumococcal Polysaccharide-23 12/28/2012   Tdap 11/20/2013   Zoster Recombinant(Shingrix) 12/21/2017, 02/26/2018   We updated and reviewed the patient's past history in detail and it is  documented below. Allergies: Patient has no known allergies. Past Medical History Patient  has a past medical history of Arthritis, Chest pain at rest (01/14/2013), Complication of anesthesia, Fracture of tibia and fibula (01/27/2015), GERD (gastroesophageal reflux disease), Hashimoto's thyroiditis (11/17/2017), High cholesterol, Hypertension, PONV (postoperative nausea and vomiting), Scopalamine controls, Seasonal allergies, Subclinical hypothyroidism (11/27/2016), Syndesmotic disruption of right ankle (03/06/2015), and Vitamin D  deficiency (03/06/2015). Past Surgical History Patient  has a past surgical history that includes ORIF tibia fracture (Right, 01/29/2015); Bunionectomy (Bilateral); Colonoscopy; Hardware Removal (Right, 12/10/2015); and Myomectomy. Family History: Patient family history includes Breast cancer in her sister; CVA in her mother, sister, sister, and sister; Diabetes in her father; Heart attack in her sister; Heart disease in her father. Social History:  Patient  reports that she has never smoked. She has never used smokeless tobacco. She reports current alcohol use of about 3.0 - 4.0 standard drinks of alcohol per week. She reports that she does not use drugs.  Review of Systems: Constitutional: negative for fever or malaise Ophthalmic: negative for photophobia, double vision or loss of vision Cardiovascular: negative for chest pain, dyspnea on exertion, or new LE swelling Respiratory: negative for SOB or persistent cough Gastrointestinal: negative for abdominal pain, change in bowel habits or melena Genitourinary: negative for dysuria or gross hematuria, no abnormal uterine bleeding or disharge Musculoskeletal: negative for new gait disturbance or muscular weakness Integumentary: negative for new or persistent rashes, no breast lumps Neurological: negative for TIA or stroke symptoms Psychiatric: negative for SI or delusions Allergic/Immunologic: negative for  hives  Patient Care Team    Relationship Specialty Notifications Start End  Jodie Lavern CROME, MD PCP - General Family Medicine  02/21/19   Trixie File, MD Consulting Physician Internal Medicine  08/15/18   Claudene Victory ORN, MD (Inactive) Consulting Physician Cardiology  08/15/18     Objective  Vitals: BP 138/82   Pulse (!) 53   Temp 97.7 F (36.5 C)   Ht 5' 2 (1.575 m)   Wt 143 lb (64.9 kg)   SpO2 97%   BMI 26.16 kg/m  General:  Well developed, well nourished, no acute distress, appears well, appears younger than stated age Psych:  Alert and orientedx3,normal mood and affect HEENT:  Normocephalic, atraumatic, non-icteric sclera,  supple neck without adenopathy, mass or thyromegaly Cardiovascular:  Normal S1, S2, RRR without gallop, rub or murmur Respiratory:  Good breath sounds bilaterally, CTAB with normal respiratory effort Gastrointestinal: normal bowel sounds, soft, non-tender, no noted masses. No HSM MSK: extremities without edema, joints without erythema or swelling Neurologic:    Mental status is normal.  Gross motor and sensory exams are normal.  No tremor  Commons side effects, risks, benefits, and alternatives for medications and treatment plan prescribed today were discussed, and the patient expressed understanding of the given instructions. Patient is instructed to call or message via MyChart if he/she has any questions or concerns regarding our treatment plan. No barriers to  understanding were identified. We discussed Red Flag symptoms and signs in detail. Patient expressed understanding regarding what to do in case of urgent or emergency type symptoms.  Medication list was reconciled, printed and provided to the patient in AVS. Patient instructions and summary information was reviewed with the patient as documented in the AVS. This note was prepared with assistance of Dragon voice recognition software. Occasional wrong-word or sound-a-like substitutions may have occurred due  to the inherent limitations of voice recognition software

## 2023-12-15 NOTE — Patient Instructions (Addendum)
 Please return in 12 months for your annual complete physical; please come fasting.   I will release your lab results to you on your MyChart account with further instructions. You may see the results before I do, but when I review them I will send you a message with my report or have my assistant call you if things need to be discussed. Please reply to my message with any questions. Thank you!   If you have any questions or concerns, please don't hesitate to send me a message via MyChart or call the office at [phone]. Thank you for visiting with us  today! It's our pleasure caring for you.    VISIT SUMMARY: Today, you had a routine wellness visit to review your blood pressure management and atrial rhythm issues. We discussed your current medications, exercise habits, and overall health maintenance. You also received your flu shot.  YOUR PLAN: -ESSENTIAL HYPERTENSION: Essential hypertension means high blood pressure without a known secondary cause. Your blood pressure is generally well-controlled at home, but you experience occasional elevations after exercise. Continue taking your low-dose metoprolol  and monitor your blood pressure at home, especially after exercise. Report any persistent high readings so we can adjust your medication if necessary.  We would like your blood pressure to be 120s over 70s or less consistently.  I will recheck your cholesterol levels and sugar levels and vitamin D .  -ADULT WELLNESS VISIT: This visit focused on maintaining your overall health through regular exercise and a healthy lifestyle. We also discussed the importance of health care directives and living wills. Please notarize your health care directive forms and submit them to your medical chart.  -ENCOUNTER FOR IMMUNIZATION: You received your flu shot today as part of your routine immunization schedule. This helps protect you from the influenza virus.  INSTRUCTIONS: Please continue to monitor your blood  pressure at home, especially after exercise, and report any persistent high readings. Notarize and submit your health care directive forms to your medical chart. Follow up with us  if you have any concerns or if your symptoms change.                      Contains text generated by [employer]bridge.                                 Contains text generated by [employer]bridge.

## 2023-12-20 ENCOUNTER — Ambulatory Visit: Payer: Self-pay | Admitting: Family Medicine

## 2023-12-20 NOTE — Progress Notes (Signed)
 See mychart note Dear Ms. Ferdinand, All of your lab results are stable.  Blood sugars remain in the prediabetic range.  Cholesterol is good.  No changes are needed at this time.  It was good seeing you! Sincerely, Dr. Jodie

## 2023-12-31 ENCOUNTER — Other Ambulatory Visit: Payer: Self-pay | Admitting: Family Medicine

## 2024-01-02 DIAGNOSIS — H903 Sensorineural hearing loss, bilateral: Secondary | ICD-10-CM | POA: Diagnosis not present

## 2024-01-02 DIAGNOSIS — H9311 Tinnitus, right ear: Secondary | ICD-10-CM | POA: Diagnosis not present

## 2024-01-02 DIAGNOSIS — Z822 Family history of deafness and hearing loss: Secondary | ICD-10-CM | POA: Diagnosis not present

## 2024-03-06 ENCOUNTER — Ambulatory Visit: Admitting: Cardiovascular Disease

## 2024-03-07 ENCOUNTER — Encounter: Payer: Self-pay | Admitting: Family Medicine

## 2024-03-07 ENCOUNTER — Other Ambulatory Visit: Payer: Self-pay

## 2024-03-07 DIAGNOSIS — R921 Mammographic calcification found on diagnostic imaging of breast: Secondary | ICD-10-CM

## 2024-03-07 NOTE — Telephone Encounter (Signed)
 Referral has been placed.

## 2024-03-14 ENCOUNTER — Encounter

## 2024-03-14 DIAGNOSIS — Z1231 Encounter for screening mammogram for malignant neoplasm of breast: Secondary | ICD-10-CM

## 2024-04-19 ENCOUNTER — Ambulatory Visit: Admitting: Cardiovascular Disease

## 2024-04-22 ENCOUNTER — Encounter

## 2024-06-03 ENCOUNTER — Ambulatory Visit: Admitting: Cardiovascular Disease

## 2024-09-12 ENCOUNTER — Ambulatory Visit

## 2024-12-26 ENCOUNTER — Encounter: Admitting: Family Medicine
